# Patient Record
Sex: Female | Born: 1940
Health system: Southern US, Community
[De-identification: ages and names within clinical notes are randomized; demographics above are authoritative.]

## PROBLEM LIST (undated history)

## (undated) DIAGNOSIS — E039 Hypothyroidism, unspecified: Secondary | ICD-10-CM

## (undated) DIAGNOSIS — E785 Hyperlipidemia, unspecified: Secondary | ICD-10-CM

## (undated) DIAGNOSIS — M8580 Other specified disorders of bone density and structure, unspecified site: Secondary | ICD-10-CM

## (undated) DIAGNOSIS — I4891 Unspecified atrial fibrillation: Secondary | ICD-10-CM

## (undated) DIAGNOSIS — I2510 Atherosclerotic heart disease of native coronary artery without angina pectoris: Secondary | ICD-10-CM

## (undated) DIAGNOSIS — D259 Leiomyoma of uterus, unspecified: Secondary | ICD-10-CM

## (undated) DIAGNOSIS — I639 Cerebral infarction, unspecified: Secondary | ICD-10-CM

## (undated) DIAGNOSIS — K219 Gastro-esophageal reflux disease without esophagitis: Secondary | ICD-10-CM

## (undated) HISTORY — DX: Hypothyroidism, unspecified: E03.9

## (undated) HISTORY — PX: ABLATION OF DYSRHYTHMIC FOCUS: SHX254

## (undated) HISTORY — DX: Hyperlipidemia, unspecified: E78.5

## (undated) HISTORY — DX: Unspecified atrial fibrillation: I48.91

## (undated) HISTORY — DX: Cerebral infarction, unspecified: I63.9

## (undated) HISTORY — DX: Gastro-esophageal reflux disease without esophagitis: K21.9

## (undated) HISTORY — DX: Atherosclerotic heart disease of native coronary artery without angina pectoris: I25.10

## (undated) HISTORY — DX: Leiomyoma of uterus, unspecified: D25.9

## (undated) HISTORY — DX: Other specified disorders of bone density and structure, unspecified site: M85.80

---

## 1961-03-14 HISTORY — PX: OTHER SURGICAL HISTORY: SHX169

## 1983-03-15 HISTORY — PX: TUBAL LIGATION: SHX77

## 2009-01-09 DIAGNOSIS — I10 Essential (primary) hypertension: Secondary | ICD-10-CM | POA: Insufficient documentation

## 2009-03-14 HISTORY — PX: BLADDER REPAIR: SHX76

## 2010-12-14 DIAGNOSIS — I480 Paroxysmal atrial fibrillation: Secondary | ICD-10-CM | POA: Insufficient documentation

## 2010-12-31 DIAGNOSIS — M19049 Primary osteoarthritis, unspecified hand: Secondary | ICD-10-CM | POA: Insufficient documentation

## 2010-12-31 DIAGNOSIS — L719 Rosacea, unspecified: Secondary | ICD-10-CM | POA: Insufficient documentation

## 2010-12-31 DIAGNOSIS — E782 Mixed hyperlipidemia: Secondary | ICD-10-CM | POA: Insufficient documentation

## 2011-03-15 HISTORY — PX: ESOPHAGEAL DILATION: SHX303

## 2011-08-12 DIAGNOSIS — K219 Gastro-esophageal reflux disease without esophagitis: Secondary | ICD-10-CM | POA: Insufficient documentation

## 2011-11-29 ENCOUNTER — Other Ambulatory Visit: Payer: Self-pay | Admitting: Family Medicine

## 2011-11-29 DIAGNOSIS — Z780 Asymptomatic menopausal state: Secondary | ICD-10-CM

## 2011-12-04 HISTORY — PX: CAROTID STENT: SHX1301

## 2011-12-05 DIAGNOSIS — I252 Old myocardial infarction: Secondary | ICD-10-CM | POA: Insufficient documentation

## 2011-12-08 ENCOUNTER — Other Ambulatory Visit: Payer: Self-pay

## 2012-01-10 DIAGNOSIS — F411 Generalized anxiety disorder: Secondary | ICD-10-CM | POA: Insufficient documentation

## 2012-02-28 ENCOUNTER — Other Ambulatory Visit: Payer: Self-pay | Admitting: Family Medicine

## 2012-02-28 DIAGNOSIS — Z780 Asymptomatic menopausal state: Secondary | ICD-10-CM

## 2012-03-20 ENCOUNTER — Ambulatory Visit
Admission: RE | Admit: 2012-03-20 | Discharge: 2012-03-20 | Disposition: A | Discharge status: Home / Self Care | Payer: Medicare Other | Source: Ambulatory Visit | Attending: Family Medicine | Admitting: Family Medicine

## 2012-03-20 DIAGNOSIS — Z780 Asymptomatic menopausal state: Secondary | ICD-10-CM

## 2012-04-03 ENCOUNTER — Other Ambulatory Visit: Payer: Self-pay | Admitting: Family Medicine

## 2012-04-03 DIAGNOSIS — Z1231 Encounter for screening mammogram for malignant neoplasm of breast: Secondary | ICD-10-CM

## 2012-04-27 ENCOUNTER — Ambulatory Visit: Payer: Medicare Other

## 2012-05-16 DIAGNOSIS — I2510 Atherosclerotic heart disease of native coronary artery without angina pectoris: Secondary | ICD-10-CM | POA: Insufficient documentation

## 2012-05-25 ENCOUNTER — Ambulatory Visit: Payer: Medicare Other

## 2012-06-08 DIAGNOSIS — M8580 Other specified disorders of bone density and structure, unspecified site: Secondary | ICD-10-CM | POA: Insufficient documentation

## 2012-06-26 ENCOUNTER — Ambulatory Visit: Payer: Medicare Other

## 2013-05-28 ENCOUNTER — Other Ambulatory Visit: Payer: Self-pay | Admitting: Family Medicine

## 2013-05-28 DIAGNOSIS — Z1231 Encounter for screening mammogram for malignant neoplasm of breast: Secondary | ICD-10-CM

## 2013-06-17 ENCOUNTER — Ambulatory Visit: Payer: Medicare Other

## 2013-07-02 ENCOUNTER — Ambulatory Visit: Payer: Medicare Other

## 2013-07-11 ENCOUNTER — Ambulatory Visit: Payer: Medicare Other

## 2013-07-23 ENCOUNTER — Encounter (INDEPENDENT_AMBULATORY_CARE_PROVIDER_SITE_OTHER): Payer: Self-pay

## 2013-07-23 ENCOUNTER — Ambulatory Visit
Admission: RE | Admit: 2013-07-23 | Discharge: 2013-07-23 | Disposition: A | Discharge status: Home / Self Care | Payer: Medicare Other | Source: Ambulatory Visit | Attending: Family Medicine | Admitting: Family Medicine

## 2013-07-23 DIAGNOSIS — Z1231 Encounter for screening mammogram for malignant neoplasm of breast: Secondary | ICD-10-CM

## 2013-07-26 ENCOUNTER — Other Ambulatory Visit: Payer: Self-pay | Admitting: Family Medicine

## 2013-07-26 DIAGNOSIS — R928 Other abnormal and inconclusive findings on diagnostic imaging of breast: Secondary | ICD-10-CM

## 2013-08-07 ENCOUNTER — Ambulatory Visit
Admission: RE | Admit: 2013-08-07 | Discharge: 2013-08-07 | Disposition: A | Discharge status: Home / Self Care | Payer: Medicare Other | Source: Ambulatory Visit | Attending: Family Medicine | Admitting: Family Medicine

## 2013-08-07 ENCOUNTER — Other Ambulatory Visit: Payer: Self-pay | Admitting: Family Medicine

## 2013-08-07 DIAGNOSIS — R928 Other abnormal and inconclusive findings on diagnostic imaging of breast: Secondary | ICD-10-CM

## 2013-08-07 DIAGNOSIS — R921 Mammographic calcification found on diagnostic imaging of breast: Secondary | ICD-10-CM

## 2013-08-19 ENCOUNTER — Ambulatory Visit
Admission: RE | Admit: 2013-08-19 | Discharge: 2013-08-19 | Disposition: A | Discharge status: Home / Self Care | Payer: Medicare Other | Source: Ambulatory Visit | Attending: Family Medicine | Admitting: Family Medicine

## 2013-08-19 DIAGNOSIS — R921 Mammographic calcification found on diagnostic imaging of breast: Secondary | ICD-10-CM

## 2013-08-19 HISTORY — PX: BREAST BIOPSY: SHX20

## 2014-05-08 ENCOUNTER — Other Ambulatory Visit: Payer: Self-pay | Admitting: Family Medicine

## 2014-05-08 DIAGNOSIS — K225 Diverticulum of esophagus, acquired: Secondary | ICD-10-CM | POA: Insufficient documentation

## 2014-05-08 DIAGNOSIS — M8580 Other specified disorders of bone density and structure, unspecified site: Secondary | ICD-10-CM

## 2014-05-27 ENCOUNTER — Inpatient Hospital Stay: Admission: RE | Admit: 2014-05-27 | Payer: Self-pay | Source: Ambulatory Visit

## 2014-06-18 ENCOUNTER — Other Ambulatory Visit: Payer: Self-pay

## 2014-08-19 ENCOUNTER — Other Ambulatory Visit: Payer: Self-pay

## 2014-08-19 DIAGNOSIS — Z1231 Encounter for screening mammogram for malignant neoplasm of breast: Secondary | ICD-10-CM

## 2014-10-02 ENCOUNTER — Ambulatory Visit: Payer: Self-pay

## 2014-10-02 ENCOUNTER — Other Ambulatory Visit: Payer: Self-pay

## 2014-10-13 ENCOUNTER — Other Ambulatory Visit: Payer: Self-pay

## 2014-10-13 ENCOUNTER — Ambulatory Visit
Admission: RE | Admit: 2014-10-13 | Discharge: 2014-10-13 | Disposition: A | Discharge status: Home / Self Care | Payer: Medicare Other | Source: Ambulatory Visit | Attending: Family Medicine | Admitting: Family Medicine

## 2014-10-13 ENCOUNTER — Ambulatory Visit: Payer: Self-pay

## 2014-10-13 ENCOUNTER — Ambulatory Visit
Admission: RE | Admit: 2014-10-13 | Discharge: 2014-10-13 | Disposition: A | Discharge status: Home / Self Care | Payer: Medicare Other | Source: Ambulatory Visit

## 2014-10-13 DIAGNOSIS — M8580 Other specified disorders of bone density and structure, unspecified site: Secondary | ICD-10-CM

## 2014-10-13 DIAGNOSIS — Z1231 Encounter for screening mammogram for malignant neoplasm of breast: Secondary | ICD-10-CM

## 2015-02-12 DIAGNOSIS — G8929 Other chronic pain: Secondary | ICD-10-CM | POA: Insufficient documentation

## 2015-02-12 DIAGNOSIS — M25561 Pain in right knee: Secondary | ICD-10-CM

## 2015-02-12 DIAGNOSIS — M25562 Pain in left knee: Secondary | ICD-10-CM

## 2015-02-12 DIAGNOSIS — F339 Major depressive disorder, recurrent, unspecified: Secondary | ICD-10-CM | POA: Insufficient documentation

## 2015-06-18 DIAGNOSIS — I252 Old myocardial infarction: Secondary | ICD-10-CM | POA: Insufficient documentation

## 2015-07-07 DIAGNOSIS — Z7901 Long term (current) use of anticoagulants: Secondary | ICD-10-CM

## 2015-07-07 HISTORY — DX: Long term (current) use of anticoagulants: Z79.01

## 2015-08-18 DIAGNOSIS — M4802 Spinal stenosis, cervical region: Secondary | ICD-10-CM | POA: Insufficient documentation

## 2016-03-16 ENCOUNTER — Telehealth: Payer: Self-pay | Admitting: Family Medicine

## 2016-03-16 NOTE — Telephone Encounter (Signed)
Encounter opened in error

## 2016-05-02 ENCOUNTER — Other Ambulatory Visit: Payer: Self-pay | Admitting: Family Medicine

## 2016-05-02 DIAGNOSIS — Z1231 Encounter for screening mammogram for malignant neoplasm of breast: Secondary | ICD-10-CM

## 2016-05-12 DIAGNOSIS — M47812 Spondylosis without myelopathy or radiculopathy, cervical region: Secondary | ICD-10-CM | POA: Insufficient documentation

## 2016-05-16 ENCOUNTER — Ambulatory Visit
Admission: RE | Admit: 2016-05-16 | Discharge: 2016-05-16 | Disposition: A | Discharge status: Home / Self Care | Payer: Medicare Other | Source: Ambulatory Visit | Attending: Family Medicine | Admitting: Family Medicine

## 2016-05-16 DIAGNOSIS — Z1231 Encounter for screening mammogram for malignant neoplasm of breast: Secondary | ICD-10-CM

## 2017-01-11 ENCOUNTER — Encounter: Payer: Self-pay | Admitting: Pharmacist

## 2017-01-11 NOTE — Progress Notes (Unsigned)
Dr. Marin Olp gave pt Kisqali sample Lot [id number]  Exp: 6/19 UAD on sample box (600mg  daily for 3 weeks on and one week off and Femara QD)

## 2017-02-14 ENCOUNTER — Telehealth: Payer: Self-pay | Admitting: General Practice

## 2017-02-14 ENCOUNTER — Ambulatory Visit: Payer: Medicare Other | Admitting: Family Medicine

## 2017-02-14 NOTE — Telephone Encounter (Signed)
Copied from [place]. Topic: Quick Communication - Appointment Cancellation >> Feb 14, 2017  8:53 AM Oneta Rack wrote: Relation to pt: self  Call back number: [phone]   Reason for call:  Patient cancelled her 2pm with Billey Chang for today, patient son had a massive stroke and she would like to be there for him while he recovers. Patient would like to [place] sooner then later, PCP has no availability., please advise

## 2017-02-14 NOTE — Telephone Encounter (Signed)
PCP has been made aware.  

## 2017-03-15 ENCOUNTER — Ambulatory Visit: Payer: Medicare Other | Admitting: Family Medicine

## 2017-03-20 ENCOUNTER — Other Ambulatory Visit: Payer: Self-pay | Admitting: *Deleted

## 2017-03-22 ENCOUNTER — Encounter: Payer: Self-pay | Admitting: Family Medicine

## 2017-03-22 ENCOUNTER — Ambulatory Visit: Payer: Medicare Other | Admitting: Family Medicine

## 2017-03-22 VITALS — BP 110/74 | HR 71 | Temp 97.6°F | Ht 63.0 in | Wt 156.2 lb

## 2017-03-22 DIAGNOSIS — R7989 Other specified abnormal findings of blood chemistry: Secondary | ICD-10-CM | POA: Diagnosis not present

## 2017-03-22 DIAGNOSIS — R5383 Other fatigue: Secondary | ICD-10-CM

## 2017-03-22 DIAGNOSIS — F411 Generalized anxiety disorder: Secondary | ICD-10-CM | POA: Diagnosis not present

## 2017-03-22 LAB — CBC WITH DIFFERENTIAL/PLATELET
Basophils Absolute: 0 10*3/uL (ref 0.0–0.1)
Basophils Relative: 0.3 % (ref 0.0–3.0)
Eosinophils Absolute: 0.1 10*3/uL (ref 0.0–0.7)
Eosinophils Relative: 2.2 % (ref 0.0–5.0)
HCT: 40.2 % (ref 36.0–46.0)
Hemoglobin: 13 g/dL (ref 12.0–15.0)
Lymphocytes Relative: 19.2 % (ref 12.0–46.0)
Lymphs Abs: 1.2 10*3/uL (ref 0.7–4.0)
MCHC: 32.4 g/dL (ref 30.0–36.0)
MCV: 97.4 fl (ref 78.0–100.0)
Monocytes Absolute: 0.9 10*3/uL (ref 0.1–1.0)
Monocytes Relative: 13.8 % — ABNORMAL HIGH (ref 3.0–12.0)
Neutro Abs: 4.1 10*3/uL (ref 1.4–7.7)
Neutrophils Relative %: 64.5 % (ref 43.0–77.0)
Platelets: 207 10*3/uL (ref 150.0–400.0)
RBC: 4.13 Mil/uL (ref 3.87–5.11)
RDW: 13.1 % (ref 11.5–15.5)
WBC: 6.4 10*3/uL (ref 4.0–10.5)

## 2017-03-22 LAB — TSH: TSH: 6.1 u[IU]/mL — ABNORMAL HIGH (ref 0.35–4.50)

## 2017-03-22 LAB — T4, FREE: Free T4: 0.71 ng/dL (ref 0.60–1.60)

## 2017-03-22 MED ORDER — ALPRAZOLAM 0.5 MG PO TABS: 0.5000 mg | ORAL_TABLET | Freq: Every day | ORAL | 0 refills | Status: DC | PRN

## 2017-03-22 MED ORDER — SERTRALINE HCL 100 MG PO TABS: 150.0000 mg | ORAL_TABLET | Freq: Every day | ORAL | 3 refills | Status: DC

## 2017-03-22 NOTE — Progress Notes (Signed)
Subjective  CC:  Chief Complaint  Patient presents with  . Establish Care    Transfer from [place]  . Fatigue  . discuss Thyroid    HPI: Brittney Cochran is a 77 y.o. female who presents to [place] at [facility] today to establish care with me as a new patient. She is a former [place] patient and is here to reestablish care with me today.   She has the following concerns or needs:   Had mildly elevated TSH in march of 2018; now would like rechecked. C/o fatigue but really more low motivation. Not depressed; dealing with high stress for last 15 months due to son with CVA - she has been basically living in [facility] parttime to care for him. No edema, hair or skin changes. Increased zoloft ot 150 daily in October to handle increased anxiety sxs and that helped. Uses xanax rarely; last refilled in July; has few pills left. Requests refills.   S/p cardioablation for afib. Remains on coumdin.  We updated and reviewed the patient's past history in detail and it is documented below.  Problem  Djd (Degenerative Joint Disease), Cervical  Foraminal Stenosis of Cervical Region   Overview:  xrays 2017; multilevel DJD   Long Term Current Use of Anticoagulant Therapy  Old Myocardial Infarction  Chronic Pain of Both Knees  Major Depression, Recurrent, Chronic (Hcc)  Zenker Diverticulum   Overview:  By upper GI study.  GI is following.  Patient hoping to avoid surgical repair.  2016   Osteopenia   Overview:  T=-0.2 lumbar spine, -1.6 at left femur. Stable 2016, mild worsening Overview:  Overview:  T=-0.2 lumbar spine, -1.6 at left femur. Recheck 2-3 years. 05/2012.   Atherosclerotic Heart Disease of Native Coronary Artery Without Angina Pectoris   Overview:  Cardiac MRI - EF normalized to 55% from 40%, no wall motion abnormalities nor scarring For lexiscan stress test 07/2013 - unremarkable/cla Overview:  Overview:  Cardiac MRI - EF normalized to 55% from 40%, no wall motion  abnormalities nor scarring For lexiscan stress test 07/2013 - unremarkable/cla   Generalized Anxiety Disorder   Overview:  Triggers are winter months, snow, darkness, family problems: has panic sxs. Controlled on prn xanax and zolft. Overview:  Overview:  Triggers are winter months, snow, darkness, family problems: has panic sxs. Controlled on prn xanax and zolft.   History of Non-St Elevation Myocardial Infarction (Nstemi)   Overview:  12/02/11 - [facility], s/p PTCA RCA, nonSTEMI EF 60%   Gastro-Esophageal Reflux Disease Without Esophagitis  Mixed Hyperlipidemia   Overview:  Goal LDL < 80; cards managing   Osteoarthritis, Hand  Rosacea  Paroxysmal Atrial Fibrillation (Hcc)   Overview:  [place] [facility] follows coumadin levels Heart doc said ok to use celebrex Overview:  Overview:  [place] [facility] follows coumadin levels Heart doc said ok to use celebrex Overview:  IMPRESSION: Stable.  She sees Dr.  Elonda Husky.  dz   Benign Essential Hypertension   Health Maintenance  Topic Date Due  . TETANUS/TDAP  03/14/2016  . MAMMOGRAM  05/16/2017  . INFLUENZA VACCINE  Completed  . DEXA SCAN  Completed  . PNA vac Low Risk Adult  Completed   Immunization History  Administered Date(s) Administered  . Influenza Split 01/11/2007, 02/04/2008, 01/01/2009, 04/19/2010, 12/14/2010, 01/12/2011  . Influenza, High Dose Seasonal PF 11/29/2011, 12/18/2012, 12/10/2013, 02/02/2015, 12/16/2016  . Influenza, Seasonal, Injecte, Preservative Fre 03/14/2009, 12/08/2015  . Influenza-Unspecified 03/14/2009  . Pneumococcal Conjugate-13 04/08/2014  . Pneumococcal  Polysaccharide-23 01/01/2009, 03/14/2009  . Pneumococcal-Unspecified 03/14/2009  . Tdap 03/14/2004, 03/14/2006   Current Meds  Medication Sig  . ALPRAZolam (XANAX) 0.5 MG tablet Take 1 tablet (0.5 mg total) by mouth daily as needed.  Marland Kitchen aspirin EC 81 MG tablet Take 81 mg by mouth.  . carvedilol (COREG) 12.5 MG tablet TK 1 T PO  BID WITH MEALS  . Co-Enzyme Q-10 30 MG CAPS Take by mouth.  . fluticasone (FLONASE) 50 MCG/ACT nasal spray USE 1 SPRAY NASALLY DAILY  . lisinopril (PRINIVIL,ZESTRIL) 2.5 MG tablet Take 2.5 mg by mouth daily.   . nitroGLYCERIN (NITROSTAT) 0.4 MG SL tablet Place 0.4 mg under the tongue.  Marland Kitchen omeprazole (PRILOSEC) 40 MG capsule TAKE 1 CAPSULE BY MOUTH TWICE DAILY  . ranolazine (RANEXA) 500 MG 12 hr tablet Take 500 mg by mouth 2 (two) times daily.   . rosuvastatin (CRESTOR) 40 MG tablet Take 40 mg by mouth daily.   . sertraline (ZOLOFT) 100 MG tablet Take 1.5 tablets (150 mg total) by mouth daily.  Marland Kitchen VITAMIN D, CHOLECALCIFEROL, PO Take by mouth.  . warfarin (COUMADIN) 2 MG tablet TAKE 1 TABLET BY MOUTH DAILY, EXCEPT TAKE 2 TABLETS ON WEDNESDAY, SATURDAY, AND SUNDAY OR AS DIRECTED  . [DISCONTINUED] ALPRAZolam (XANAX) 0.5 MG tablet Take 0.5 mg by mouth 2 (two) times daily as needed.   . [DISCONTINUED] sertraline (ZOLOFT) 50 MG tablet Take 150 mg by mouth daily.    Allergies: Patient has No Known Allergies.  Past Medical History Patient  has a past medical history of Atrial fibrillation ([place]), Coronary artery disease, GERD (gastroesophageal reflux disease), Hyperlipemia, Leiomyoma of body of uterus, and Osteopenia. Past Surgical History Patient  has a past surgical history that includes Breast biopsy (Left, 08/19/2013); Ablation of dysrhythmic focus; Bladder repair (2011); Tubal ligation (1985); Floating Kidney [phone]); Carotid stent (12/04/2011); and Esophageal dilation (2013). Family History: Patient family history includes Alcohol abuse in her father; Atrial fibrillation in her mother; Diabetes in her brother, father, and paternal grandmother; Early death in her father and maternal grandfather; Heart attack in her father, maternal uncle, and paternal grandmother; Kidney disease in her maternal grandmother; Stroke in her maternal aunt, maternal grandfather, paternal aunt, and son; Transient ischemic  attack in her mother. Social History:  Patient  reports that  has never smoked. she has never used smokeless tobacco. She reports that she drinks about 1.2 - 1.8 oz of alcohol per week. She reports that she does not use drugs.  Review of Systems: Constitutional: negative for fever or malaise Cardiovascular: negative for chest pain Respiratory: negative for SOB or persistent cough Gastrointestinal: negative for abdominal pain Genitourinary: negative for dysuria or gross hematuria Musculoskeletal: negative for new gait disturbance or muscular weakness Integumentary: negative for new or persistent rashes  Patient Care Team    Relationship Specialty Notifications Start End  Leamon Arnt, MD PCP - General Family Medicine  03/21/17     Objective  Vitals: BP 110/74 (BP Location: Left Arm, Patient Position: Sitting, Cuff Size: Normal)   Pulse 71   Temp 97.6 F (36.4 C) (Oral)   Ht 5\' 3"  (1.6 m)   Wt 156 lb 4 oz (70.9 kg)   SpO2 95%   BMI 27.68 kg/m  General:  Well developed, well nourished, no acute distress  Psych:  Alert and oriented,normal mood and affect HEENT:  Normocephalic, atraumatic, supple neck  Cardiovascular:  RRR without murmur, no edema Respiratory:  Good breath sounds bilaterally, CTAB with normal respiratory effort  Gastrointestinal: normal bowel sounds, soft, non-tender, no noted masses. No HSM Skin:  Warm, no rashes Neurologic:   Mental status is normal. Gross motor and sensory exams are normal. Normal gait  Assessment  1. Fatigue, unspecified type   2. Abnormal TSH   3. Generalized anxiety disorder      Plan   Today's visit was 30 minutes long. Greater than 50% of this time was devoted to face to face counseling with the patient and coordination of care. We discussed her diagnosis, prognosis, treatment options and will check TSH and t4 today. Discussed need to start planning for care of her son. Continue zoloft 150 daily and refilled xanax. Will reassess  anxiety and mood in 3 months at cpe.   Follow up: 3 months for cpe   Commons side effects, risks, benefits, and alternatives for medications and treatment plan prescribed today were discussed, and the patient expressed understanding of the given instructions. Patient is instructed to call or message via MyChart if he/she has any questions or concerns regarding our treatment plan. No barriers to understanding were identified. We discussed Red Flag symptoms and signs in detail. Patient expressed understanding regarding what to do in case of urgent or emergency type symptoms.   Medication list was reconciled, printed and provided to the patient in AVS. Patient instructions and summary information was reviewed with the patient as documented in the AVS. This note was prepared with assistance of Dragon voice recognition software. Occasional wrong-word or sound-a-like substitutions may have occurred due to the inherent limitations of voice recognition software  Orders Placed This Encounter  Procedures  . CBC with Differential/Platelet  . TSH  . T4, free   Meds ordered this encounter  Medications  . sertraline (ZOLOFT) 100 MG tablet    Sig: Take 1.5 tablets (150 mg total) by mouth daily.    Dispense:  90 tablet    Refill:  3  . ALPRAZolam (XANAX) 0.5 MG tablet    Sig: Take 1 tablet (0.5 mg total) by mouth daily as needed.    Dispense:  30 tablet    Refill:  0

## 2017-03-22 NOTE — Patient Instructions (Signed)
It was so good seeing you again! Thank you for establishing with my new practice and allowing me to continue caring for you. It means a lot to me.   Please schedule a follow up appointment with me in 2-3 months for your complete physical.  Please start planning for your son, in a more realistic way.

## 2017-03-24 ENCOUNTER — Other Ambulatory Visit: Payer: Self-pay | Admitting: Family Medicine

## 2017-03-24 ENCOUNTER — Encounter: Payer: Self-pay | Admitting: Family Medicine

## 2017-03-24 DIAGNOSIS — E039 Hypothyroidism, unspecified: Secondary | ICD-10-CM

## 2017-03-24 DIAGNOSIS — E038 Other specified hypothyroidism: Secondary | ICD-10-CM | POA: Insufficient documentation

## 2017-03-24 HISTORY — DX: Other specified hypothyroidism: E03.8

## 2017-03-24 MED ORDER — SYNTHROID 25 MCG PO TABS: 25.0000 ug | ORAL_TABLET | Freq: Every day | ORAL | 1 refills | Status: DC

## 2017-03-24 NOTE — Progress Notes (Signed)
Starting synthroid for subclin hypothyroidism.

## 2017-03-24 NOTE — Progress Notes (Signed)
Please call patient: I have reviewed his/her lab results. Her thyroid function still remains in the low normal range, and is not likely causing her fatigue: however, I recommend starting a low dose thyroid supplement to ensure it stays in the normal range. I've ordered Synthroid 11mcg to take daily. Do not take with iron or vitamin D so it will be best absorbed. We will recheck these levels at her next appointment.  And please send her a copy of the results.

## 2017-04-17 DIAGNOSIS — R791 Abnormal coagulation profile: Secondary | ICD-10-CM | POA: Diagnosis not present

## 2017-04-17 DIAGNOSIS — Z5181 Encounter for therapeutic drug level monitoring: Secondary | ICD-10-CM | POA: Diagnosis not present

## 2017-04-17 DIAGNOSIS — Z7901 Long term (current) use of anticoagulants: Secondary | ICD-10-CM | POA: Diagnosis not present

## 2017-04-17 DIAGNOSIS — I4891 Unspecified atrial fibrillation: Secondary | ICD-10-CM | POA: Diagnosis not present

## 2017-04-17 DIAGNOSIS — Z79899 Other long term (current) drug therapy: Secondary | ICD-10-CM | POA: Diagnosis not present

## 2017-05-02 DIAGNOSIS — R0989 Other specified symptoms and signs involving the circulatory and respiratory systems: Secondary | ICD-10-CM | POA: Diagnosis not present

## 2017-05-02 DIAGNOSIS — L600 Ingrowing nail: Secondary | ICD-10-CM | POA: Diagnosis not present

## 2017-05-04 DIAGNOSIS — R0989 Other specified symptoms and signs involving the circulatory and respiratory systems: Secondary | ICD-10-CM | POA: Diagnosis not present

## 2017-05-18 ENCOUNTER — Encounter: Payer: Self-pay | Admitting: Family Medicine

## 2017-05-18 ENCOUNTER — Ambulatory Visit (INDEPENDENT_AMBULATORY_CARE_PROVIDER_SITE_OTHER): Payer: Medicare Other | Admitting: Family Medicine

## 2017-05-18 ENCOUNTER — Other Ambulatory Visit: Payer: Self-pay

## 2017-05-18 VITALS — BP 102/64 | HR 62 | Temp 98.0°F | Resp 16 | Ht 62.0 in | Wt 158.0 lb

## 2017-05-18 DIAGNOSIS — E039 Hypothyroidism, unspecified: Secondary | ICD-10-CM | POA: Diagnosis not present

## 2017-05-18 DIAGNOSIS — K219 Gastro-esophageal reflux disease without esophagitis: Secondary | ICD-10-CM

## 2017-05-18 DIAGNOSIS — E782 Mixed hyperlipidemia: Secondary | ICD-10-CM

## 2017-05-18 DIAGNOSIS — I4891 Unspecified atrial fibrillation: Secondary | ICD-10-CM | POA: Diagnosis not present

## 2017-05-18 DIAGNOSIS — I252 Old myocardial infarction: Secondary | ICD-10-CM

## 2017-05-18 DIAGNOSIS — G8929 Other chronic pain: Secondary | ICD-10-CM

## 2017-05-18 DIAGNOSIS — Z0000 Encounter for general adult medical examination without abnormal findings: Secondary | ICD-10-CM

## 2017-05-18 DIAGNOSIS — F339 Major depressive disorder, recurrent, unspecified: Secondary | ICD-10-CM | POA: Diagnosis not present

## 2017-05-18 DIAGNOSIS — Z7901 Long term (current) use of anticoagulants: Secondary | ICD-10-CM | POA: Diagnosis not present

## 2017-05-18 DIAGNOSIS — R791 Abnormal coagulation profile: Secondary | ICD-10-CM | POA: Diagnosis not present

## 2017-05-18 DIAGNOSIS — Z0001 Encounter for general adult medical examination with abnormal findings: Secondary | ICD-10-CM | POA: Diagnosis not present

## 2017-05-18 DIAGNOSIS — F411 Generalized anxiety disorder: Secondary | ICD-10-CM

## 2017-05-18 DIAGNOSIS — E038 Other specified hypothyroidism: Secondary | ICD-10-CM

## 2017-05-18 DIAGNOSIS — M8580 Other specified disorders of bone density and structure, unspecified site: Secondary | ICD-10-CM | POA: Diagnosis not present

## 2017-05-18 DIAGNOSIS — M25561 Pain in right knee: Secondary | ICD-10-CM | POA: Diagnosis not present

## 2017-05-18 DIAGNOSIS — Z5181 Encounter for therapeutic drug level monitoring: Secondary | ICD-10-CM | POA: Diagnosis not present

## 2017-05-18 DIAGNOSIS — I480 Paroxysmal atrial fibrillation: Secondary | ICD-10-CM

## 2017-05-18 DIAGNOSIS — Z79899 Other long term (current) drug therapy: Secondary | ICD-10-CM | POA: Diagnosis not present

## 2017-05-18 LAB — LIPID PANEL
Cholesterol: 140 mg/dL (ref 0–200)
HDL: 80 mg/dL (ref >39.00)
LDL Cholesterol: 39 mg/dL (ref 0–99)
NonHDL: 59.5
Total CHOL/HDL Ratio: 2
Triglycerides: 104 mg/dL (ref 0.0–149.0)
VLDL: 20.8 mg/dL (ref 0.0–40.0)

## 2017-05-18 LAB — CBC WITH DIFFERENTIAL/PLATELET
Basophils Absolute: 0 10*3/uL (ref 0.0–0.1)
Basophils Relative: 0.6 % (ref 0.0–3.0)
Eosinophils Absolute: 0.1 10*3/uL (ref 0.0–0.7)
Eosinophils Relative: 2.2 % (ref 0.0–5.0)
HCT: 39.5 % (ref 36.0–46.0)
Hemoglobin: 13.1 g/dL (ref 12.0–15.0)
Lymphocytes Relative: 21.4 % (ref 12.0–46.0)
Lymphs Abs: 1.2 10*3/uL (ref 0.7–4.0)
MCHC: 33.1 g/dL (ref 30.0–36.0)
MCV: 95.3 fl (ref 78.0–100.0)
Monocytes Absolute: 0.8 10*3/uL (ref 0.1–1.0)
Monocytes Relative: 13.3 % — ABNORMAL HIGH (ref 3.0–12.0)
Neutro Abs: 3.6 10*3/uL (ref 1.4–7.7)
Neutrophils Relative %: 62.5 % (ref 43.0–77.0)
Platelets: 199 10*3/uL (ref 150.0–400.0)
RBC: 4.14 Mil/uL (ref 3.87–5.11)
RDW: 13.7 % (ref 11.5–15.5)
WBC: 5.7 10*3/uL (ref 4.0–10.5)

## 2017-05-18 LAB — COMPREHENSIVE METABOLIC PANEL
ALT: 20 U/L (ref 0–35)
AST: 23 U/L (ref 0–37)
Albumin: 4.2 g/dL (ref 3.5–5.2)
Alkaline Phosphatase: 76 U/L (ref 39–117)
BUN: 17 mg/dL (ref 6–23)
CO2: 29 mEq/L (ref 19–32)
Calcium: 9.7 mg/dL (ref 8.4–10.5)
Chloride: 102 mEq/L (ref 96–112)
Creatinine, Ser: 0.85 mg/dL (ref 0.40–1.20)
GFR: 68.75 mL/min (ref >60.00)
Glucose, Bld: 92 mg/dL (ref 70–99)
Potassium: 4.3 mEq/L (ref 3.5–5.1)
Sodium: 138 mEq/L (ref 135–145)
Total Bilirubin: 0.5 mg/dL (ref 0.2–1.2)
Total Protein: 6.5 g/dL (ref 6.0–8.3)

## 2017-05-18 LAB — TSH: TSH: 3.73 u[IU]/mL (ref 0.35–4.50)

## 2017-05-18 NOTE — Patient Instructions (Addendum)
Please return in 6 months for recheck blood pressure and mood. Return for a steroid injection in your right knee. tomorrow Ice your knee twice a day.   Medicare recommends an Annual Wellness Visit for all patients. Please schedule this to be done with our Nurse Educator, Maudie Mercury. This is an informative "talk" visit; it's goals are to ensure that your health care needs are being met and to give you education regarding avoiding falls, ensuring you are not suffering from depression or problems with memory or thinking, and to educate you on Advance Care Planning. It helps me take good care of you!  Please schedule your mammogram and bone density in April at the breast center.   If you have any questions or concerns, please don't hesitate to send me a message via MyChart or call the office at [phone]. Thank you for visiting with [place] today! It's our pleasure caring for you.  Please do these things to maintain good health!   Exercise at least 30-45 minutes a day,  4-5 days a week.   Eat a low-fat diet with lots of fruits and vegetables, up to 7-9 servings per day.  Drink plenty of water daily. Try to drink 8 8oz glasses per day.  Seatbelts can save your life. Always wear your seatbelt.  Place Smoke Detectors on every level of your home and check batteries every year.  Schedule an appointment with an eye doctor for an eye exam every 1-2 years  Safe sex - use condoms to protect yourself from STDs if you could be exposed to these types of infections. Use birth control if you do not want to become pregnant and are sexually active.  Avoid heavy alcohol use. If you drink, keep it to less than 2 drinks/day and not every day.  [place].  Choose someone you trust that could speak for you if you became unable to speak for yourself.  Depression is common in our stressful world.If you're feeling down or losing interest in things you normally enjoy, please come in for a visit.  If  anyone is threatening or hurting you, please get help. Physical or Emotional Violence is never OK.

## 2017-05-18 NOTE — Progress Notes (Signed)
Subjective  Chief Complaint  Patient presents with  . Annual Exam    Wants a copy of her last labs, zoloft, thyroid meds, right knee popping in and out of socket, patient is feeling better    HPI: Brittney Cochran is a 77 y.o. female who presents to [place] at [facility] today for a Female Wellness Visit. She also has the concerns and/or needs as listed above in the chief complaint. These will be addressed in addition to the Health Maintenance Visit.   Wellness Visit: annual visit with health maintenance review and exam without Pap   Doing much better than last visit. HM: due for labs, mammo, dexa. Lifestyle: Body mass index is 28.58 kg/m. Wt Readings from Last 3 Encounters:  03/22/17 156 lb 4 oz (70.9 kg)   Diet: general Exercise: rarely, walking  Chronic disease management visit and/or acute problem visit:  Depression/anxiety; remains on zoloft 150 and mood is much better now. Has been home for 2 months and able to get herself more organized. [place] next [place] and feels good about it. No AEs. Sleep is good.   CAD/afib: stable.   Hyperlipdemia: due for labs. Non fasting. On statin. No myalgias.   Subclinical hypothyroidism: on 25 mcg synthroid; feels much better but relates less fatigue to social circumstances rather than meds. No tremor. No weight changes. Physically feeling well.   Right knee pain x 1 year: worsening; popping. No locking or giveway. No injury. + swelling. Intermittent pain. Had h/o meniscus injury in past.   GERD - stable on omeprazole  Patient Active Problem List   Diagnosis Date Noted  . Long term current use of anticoagulant therapy 07/07/2015    Priority: High  . Major depression, recurrent, chronic ([place]) 02/12/2015    Priority: High  . Atherosclerotic heart disease of native coronary artery without angina pectoris 05/16/2012    Priority: High  . Generalized anxiety disorder 01/10/2012      Priority: High  . History of non-ST elevation myocardial infarction (NSTEMI) 12/05/2011    Priority: High  . Mixed hyperlipidemia 12/31/2010    Priority: High  . Paroxysmal atrial fibrillation ([place]) 12/14/2010    Priority: High  . Benign essential hypertension 01/09/2009    Priority: High  . Subclinical hypothyroidism 03/24/2017    Priority: Medium  . DJD (degenerative joint disease), cervical 05/12/2016    Priority: Medium  . Foraminal stenosis of cervical region 08/18/2015    Priority: Medium  . Chronic pain of both knees 02/12/2015    Priority: Medium  . Zenker diverticulum 05/08/2014    Priority: Medium  . Osteopenia 06/08/2012    Priority: Medium  . Gastro-esophageal reflux disease without esophagitis 08/12/2011    Priority: Medium  . Osteoarthritis, hand 12/31/2010    Priority: Low  . Rosacea 12/31/2010    Priority: Low   Health Maintenance  Topic Date Due  . DEXA SCAN  10/12/2016  . MAMMOGRAM  05/16/2017  . INFLUENZA VACCINE  Completed  . PNA vac Low Risk Adult  Completed   Immunization History  Administered Date(s) Administered  . Influenza Split 01/11/2007, 02/04/2008, 01/01/2009, 04/19/2010, 12/14/2010, 01/12/2011  . Influenza, High Dose Seasonal PF 11/29/2011, 12/18/2012, 12/10/2013, 02/02/2015, 12/16/2016  . Influenza, Seasonal, Injecte, Preservative Fre 03/14/2009, 12/08/2015  . Influenza-Unspecified 03/14/2009  . Pneumococcal Conjugate-13 04/08/2014  . Pneumococcal Polysaccharide-23 01/01/2009, 03/14/2009  . Pneumococcal-Unspecified 03/14/2009  . Tdap 03/14/2004, 03/14/2006   We updated and reviewed  the patient's past history in detail and it is documented below. Allergies: Patient has No Known Allergies. Past Medical History Patient  has a past medical history of Atrial fibrillation ([place]), Coronary artery disease, GERD (gastroesophageal reflux disease), Hyperlipemia, Leiomyoma of body of uterus, Osteopenia, and Subclinical hypothyroidism  (03/24/2017). Past Surgical History Patient  has a past surgical history that includes Breast biopsy (Left, 08/19/2013); Ablation of dysrhythmic focus; Bladder repair (2011); Tubal ligation (1985); Floating Kidney [phone]); Carotid stent (12/04/2011); and Esophageal dilation (2013). Family History: Patient family history includes Alcohol abuse in her father; Atrial fibrillation in her mother; Diabetes in her brother, father, and paternal grandmother; Early death in her father and maternal grandfather; Heart attack in her father, maternal uncle, and paternal grandmother; Kidney disease in her maternal grandmother; Stroke in her maternal aunt, maternal grandfather, paternal aunt, and son; Transient ischemic attack in her mother. Social History:  Patient  reports that  has never smoked. she has never used smokeless tobacco. She reports that she drinks about 1.2 - 1.8 oz of alcohol per week. She reports that she does not use drugs.  Review of Systems: Constitutional: negative for fever or malaise Ophthalmic: negative for photophobia, double vision or loss of vision Cardiovascular: negative for chest pain, dyspnea on exertion, or new LE swelling Respiratory: negative for SOB or persistent cough Gastrointestinal: negative for abdominal pain, change in bowel habits or melena Genitourinary: negative for dysuria or gross hematuria, no abnormal uterine bleeding or disharge Musculoskeletal: negative for new gait disturbance or muscular weakness + left knee pain Integumentary: negative for new or persistent rashes, no breast lumps Neurological: negative for TIA or stroke symptoms Psychiatric: negative for SI or delusions Allergic/Immunologic: negative for hives  Patient Care Team    Relationship Specialty Notifications Start End  Leamon Arnt, MD PCP - General Family Medicine  03/21/17   has GYN: Benjamine Mola stanbaugh  Objective  Vitals: Ht 5\' 2"  (1.575 m)   BMI 28.58 kg/m  General:  Well developed,  well nourished, no acute distress  Psych:  Alert and orientedx3,normal mood and affect, much brighter today HEENT:  Normocephalic, atraumatic, non-icteric sclera, PERRL, oropharynx is clear without mass or exudate, supple neck without adenopathy, mass or thyromegaly Cardiovascular:  Normal S1, S2, RRR without gallop, rub or murmur, nondisplaced PMI Respiratory:  Good breath sounds bilaterally, CTAB with normal respiratory effort Gastrointestinal: normal bowel sounds, soft, non-tender, no noted masses. No HSM MSK: no deformities, contusions. Right knee with swelling, lat jt line ttp, crepitus, neg lachmans, neg mcmurrays. Spine and CVA region are nontender Skin:  Warm, no rashes or suspicious lesions noted Neurologic:    Mental status is normal. CN 2-11 are normal. Gross motor and sensory exams are normal. Normal gait. No tremor Breast Exam: No mass, skin retraction or nipple discharge is appreciated in either breast. No axillary adenopathy. Fibrocystic changes are not noted   Assessment  1. Annual physical exam   2. Subclinical hypothyroidism   3. Major depression, recurrent, chronic ([place])   4. Osteopenia, unspecified location   5. Gastro-esophageal reflux disease without esophagitis   6. Generalized anxiety disorder   7. History of non-ST elevation myocardial infarction (NSTEMI)   8. Mixed hyperlipidemia   9. Paroxysmal atrial fibrillation [facility]) Chronic     Plan  Female Wellness Visit:  Age appropriate Health Maintenance and Prevention measures were discussed with patient. Included topics are cancer screening recommendations, ways to keep healthy (see AVS) including dietary and exercise recommendations, regular eye and dental care,  use of seat belts, and avoidance of moderate alcohol use and tobacco use. Set up mammogram and dexa for April   BMI: discussed patient's BMI and encouraged positive lifestyle modifications to help get to or maintain a target BMI.  HM needs and immunizations  were addressed and ordered. See below for orders. See HM and immunization section for updates. utd  Routine labs and screening tests ordered including cmp, cbc and lipids where appropriate.  Discussed recommendations regarding Vit D and calcium supplementation (see AVS)  Chronic disease f/u and/or acute problem visit: (deemed necessary to be done in addition to the wellness visit):  Subclinical hypothyroidism: recheck levels on low dose supplementation.   Depression is much improved. Continue zoloft 150 daily for next 6-12 months, then consider weaning back to 100.   CAD/afib: stable.   Lipids: recheck on statin. Check lft  Knee pain: OA vs meniscus: return for steroid injection. Ice.   Follow up: Return in about 6 months (around 11/18/2017) for follow up Hypertension, mood follow up.   Commons side effects, risks, benefits, and alternatives for medications and treatment plan prescribed today were discussed, and the patient expressed understanding of the given instructions. Patient is instructed to call or message via MyChart if he/she has any questions or concerns regarding our treatment plan. No barriers to understanding were identified. We discussed Red Flag symptoms and signs in detail. Patient expressed understanding regarding what to do in case of urgent or emergency type symptoms.   Medication list was reconciled, printed and provided to the patient in AVS. Patient instructions and summary information was reviewed with the patient as documented in the AVS. This note was prepared with assistance of Dragon voice recognition software. Occasional wrong-word or sound-a-like substitutions may have occurred due to the inherent limitations of voice recognition software  Orders Placed This Encounter  Procedures  . DG Bone Density  . TSH  . Comprehensive metabolic panel  . CBC with Differential/Platelet  . Lipid panel   No orders of the defined types were placed in this encounter.

## 2017-05-19 ENCOUNTER — Encounter: Payer: Self-pay | Admitting: Family Medicine

## 2017-05-19 ENCOUNTER — Ambulatory Visit: Payer: Medicare Other | Admitting: Family Medicine

## 2017-05-19 VITALS — BP 112/70 | HR 66 | Temp 98.5°F | Ht 62.0 in | Wt 159.6 lb

## 2017-05-19 DIAGNOSIS — M25561 Pain in right knee: Secondary | ICD-10-CM

## 2017-05-19 DIAGNOSIS — G8929 Other chronic pain: Secondary | ICD-10-CM

## 2017-05-19 MED ORDER — DICLOFENAC SODIUM 1 % TD GEL: 4.0000 g | Freq: Three times a day (TID) | TRANSDERMAL | 3 refills | Status: DC | PRN

## 2017-05-19 NOTE — Progress Notes (Signed)
Knee Arthrocentesis with Injection Procedure Note  Pre-operative Diagnosis: right knee pain: osteoarthritis vs internal derangment (meniscus)  Post-operative Diagnosis: same  Indications: Symptom relief from osteoarthritis  Anesthesia: Lidocaine 1% without epinephrine without added sodium bicarbonate  Procedure Details   Verbal consent was obtained for the procedure. Universal time out taken.  The Knee joint was prepped with alcohol and an 18 gauge needle was inserted into the joint from the lateral approach.. Four ml 1% lidocaine and one ml of triamcinolone (KENALOG) 40mg /ml was then injected into the joint through the same needle. The needle was removed and the area cleansed and dressed.  Complications:  None; patient tolerated the procedure well.

## 2017-05-19 NOTE — Progress Notes (Signed)
I have reviewed results. Normal. Patient notified by letter. Please see letter for details. 

## 2017-05-19 NOTE — Progress Notes (Signed)
Please call patient: I have reviewed his/her lab results. All lab results are normal. Everything looks good. Thyroid level is improved so continue thyroid medication. Will send letter with results as well. Thanks.

## 2017-05-19 NOTE — Patient Instructions (Signed)
Rest your leg and ice for the next several days.   Please follow up if symptoms do not improve or as needed.

## 2017-05-22 ENCOUNTER — Telehealth: Payer: Self-pay | Admitting: Emergency Medicine

## 2017-05-22 NOTE — Telephone Encounter (Signed)
PA for Voltaren Gel 0.1 % Approved through 03/13/2018

## 2017-05-26 NOTE — Progress Notes (Signed)
Charges for Kenalog put in

## 2017-06-28 ENCOUNTER — Other Ambulatory Visit: Payer: Self-pay | Admitting: Family Medicine

## 2017-06-28 DIAGNOSIS — Z139 Encounter for screening, unspecified: Secondary | ICD-10-CM

## 2017-07-07 DIAGNOSIS — I4891 Unspecified atrial fibrillation: Secondary | ICD-10-CM | POA: Diagnosis not present

## 2017-07-07 DIAGNOSIS — Z7901 Long term (current) use of anticoagulants: Secondary | ICD-10-CM | POA: Diagnosis not present

## 2017-07-07 DIAGNOSIS — Z5181 Encounter for therapeutic drug level monitoring: Secondary | ICD-10-CM | POA: Diagnosis not present

## 2017-07-07 DIAGNOSIS — R791 Abnormal coagulation profile: Secondary | ICD-10-CM | POA: Diagnosis not present

## 2017-07-21 DIAGNOSIS — I480 Paroxysmal atrial fibrillation: Secondary | ICD-10-CM | POA: Diagnosis not present

## 2017-07-21 DIAGNOSIS — Z5181 Encounter for therapeutic drug level monitoring: Secondary | ICD-10-CM | POA: Diagnosis not present

## 2017-07-21 DIAGNOSIS — Z79899 Other long term (current) drug therapy: Secondary | ICD-10-CM | POA: Diagnosis not present

## 2017-07-21 DIAGNOSIS — R791 Abnormal coagulation profile: Secondary | ICD-10-CM | POA: Diagnosis not present

## 2017-07-21 DIAGNOSIS — Z7901 Long term (current) use of anticoagulants: Secondary | ICD-10-CM | POA: Diagnosis not present

## 2017-08-01 ENCOUNTER — Ambulatory Visit
Admission: RE | Admit: 2017-08-01 | Discharge: 2017-08-01 | Disposition: A | Discharge status: Home / Self Care | Payer: Medicare Other | Source: Ambulatory Visit | Attending: Family Medicine | Admitting: Family Medicine

## 2017-08-01 DIAGNOSIS — Z139 Encounter for screening, unspecified: Secondary | ICD-10-CM

## 2017-08-01 DIAGNOSIS — Z780 Asymptomatic menopausal state: Secondary | ICD-10-CM | POA: Diagnosis not present

## 2017-08-01 DIAGNOSIS — R791 Abnormal coagulation profile: Secondary | ICD-10-CM | POA: Diagnosis not present

## 2017-08-01 DIAGNOSIS — M8580 Other specified disorders of bone density and structure, unspecified site: Secondary | ICD-10-CM

## 2017-08-01 DIAGNOSIS — Z1231 Encounter for screening mammogram for malignant neoplasm of breast: Secondary | ICD-10-CM | POA: Diagnosis not present

## 2017-08-01 DIAGNOSIS — Z7901 Long term (current) use of anticoagulants: Secondary | ICD-10-CM | POA: Diagnosis not present

## 2017-08-01 DIAGNOSIS — I480 Paroxysmal atrial fibrillation: Secondary | ICD-10-CM | POA: Diagnosis not present

## 2017-08-01 DIAGNOSIS — M85851 Other specified disorders of bone density and structure, right thigh: Secondary | ICD-10-CM | POA: Diagnosis not present

## 2017-08-01 DIAGNOSIS — Z5181 Encounter for therapeutic drug level monitoring: Secondary | ICD-10-CM | POA: Diagnosis not present

## 2017-08-01 NOTE — Progress Notes (Signed)
Please call patient: I have reviewed his/her lab results. Her bone density results are stable. Has low bone mass; no new medications are needed at this time. Keep with weight bearing exercises and ca / vit D supplements. We will recheck in 2 years.

## 2017-08-03 ENCOUNTER — Other Ambulatory Visit: Payer: Self-pay | Admitting: Family Medicine

## 2017-08-03 MED ORDER — SYNTHROID 25 MCG PO TABS: 25.0000 ug | ORAL_TABLET | Freq: Every day | ORAL | 3 refills | Status: DC

## 2017-08-22 DIAGNOSIS — Z79899 Other long term (current) drug therapy: Secondary | ICD-10-CM | POA: Diagnosis not present

## 2017-08-22 DIAGNOSIS — R791 Abnormal coagulation profile: Secondary | ICD-10-CM | POA: Diagnosis not present

## 2017-08-22 DIAGNOSIS — I480 Paroxysmal atrial fibrillation: Secondary | ICD-10-CM | POA: Diagnosis not present

## 2017-08-22 DIAGNOSIS — Z7901 Long term (current) use of anticoagulants: Secondary | ICD-10-CM | POA: Diagnosis not present

## 2017-08-22 DIAGNOSIS — Z5181 Encounter for therapeutic drug level monitoring: Secondary | ICD-10-CM | POA: Diagnosis not present

## 2017-09-11 DIAGNOSIS — Z5181 Encounter for therapeutic drug level monitoring: Secondary | ICD-10-CM | POA: Diagnosis not present

## 2017-09-11 DIAGNOSIS — Z7901 Long term (current) use of anticoagulants: Secondary | ICD-10-CM | POA: Diagnosis not present

## 2017-09-11 DIAGNOSIS — Z79899 Other long term (current) drug therapy: Secondary | ICD-10-CM | POA: Diagnosis not present

## 2017-09-11 DIAGNOSIS — I480 Paroxysmal atrial fibrillation: Secondary | ICD-10-CM | POA: Diagnosis not present

## 2017-09-12 ENCOUNTER — Other Ambulatory Visit: Payer: Self-pay | Admitting: Family Medicine

## 2017-09-12 NOTE — Telephone Encounter (Signed)
Copied from [place] [id number]. Topic: Quick Communication - See Telephone Encounter >> Sep 12, 2017  2:41 PM Mylinda Latina, NT wrote: CRM for notification. See Telephone encounter for: 09/12/17. Patient called and states she needs a refill of her ALPRAZolam Duanne Moron) 0.5 MG tablet  [id number] - [place], [place] - [address] AT [place] [address] [phone] (Phone) [phone] (Fax)

## 2017-09-12 NOTE — Telephone Encounter (Signed)
Xanax refill Last Refill:03/22/17 #30 Last OV: 05/18/17  PCP: Dr. Jonni Sanger Pharmacy: [facility]   [address]

## 2017-09-13 NOTE — Telephone Encounter (Signed)
Patient checking status.

## 2017-09-13 NOTE — Telephone Encounter (Signed)
Pt would like to be contacted if possible

## 2017-09-15 MED ORDER — ALPRAZOLAM 0.5 MG PO TABS: 0.5000 mg | ORAL_TABLET | Freq: Every day | ORAL | 0 refills | Status: DC | PRN

## 2017-09-19 NOTE — Telephone Encounter (Signed)
Patient advised that rx was sent to the pharmacy.

## 2017-09-20 ENCOUNTER — Ambulatory Visit: Payer: Medicare Other | Admitting: Family Medicine

## 2017-09-26 DIAGNOSIS — Z7901 Long term (current) use of anticoagulants: Secondary | ICD-10-CM | POA: Diagnosis not present

## 2017-09-26 DIAGNOSIS — Z5181 Encounter for therapeutic drug level monitoring: Secondary | ICD-10-CM | POA: Diagnosis not present

## 2017-09-26 DIAGNOSIS — I480 Paroxysmal atrial fibrillation: Secondary | ICD-10-CM | POA: Diagnosis not present

## 2017-09-26 DIAGNOSIS — Z79899 Other long term (current) drug therapy: Secondary | ICD-10-CM | POA: Diagnosis not present

## 2017-11-23 ENCOUNTER — Other Ambulatory Visit: Payer: Self-pay | Admitting: Family Medicine

## 2017-11-23 NOTE — Telephone Encounter (Signed)
Received and reviewed medication refill request.  Request is appropriate and was approved.  Please see medication orders for details.  

## 2017-11-29 DIAGNOSIS — K219 Gastro-esophageal reflux disease without esophagitis: Secondary | ICD-10-CM | POA: Diagnosis not present

## 2017-11-29 DIAGNOSIS — E785 Hyperlipidemia, unspecified: Secondary | ICD-10-CM | POA: Diagnosis not present

## 2017-11-29 DIAGNOSIS — I480 Paroxysmal atrial fibrillation: Secondary | ICD-10-CM | POA: Diagnosis not present

## 2017-11-29 DIAGNOSIS — I10 Essential (primary) hypertension: Secondary | ICD-10-CM | POA: Diagnosis not present

## 2017-11-29 DIAGNOSIS — I2510 Atherosclerotic heart disease of native coronary artery without angina pectoris: Secondary | ICD-10-CM | POA: Diagnosis not present

## 2017-11-30 DIAGNOSIS — R9431 Abnormal electrocardiogram [ECG] [EKG]: Secondary | ICD-10-CM | POA: Diagnosis not present

## 2017-12-01 ENCOUNTER — Other Ambulatory Visit: Payer: Self-pay

## 2017-12-01 ENCOUNTER — Ambulatory Visit: Payer: Medicare Other | Admitting: Family Medicine

## 2017-12-01 ENCOUNTER — Encounter: Payer: Self-pay | Admitting: Family Medicine

## 2017-12-01 VITALS — BP 122/72 | HR 72 | Temp 97.5°F | Ht 62.0 in | Wt 156.6 lb

## 2017-12-01 DIAGNOSIS — K219 Gastro-esophageal reflux disease without esophagitis: Secondary | ICD-10-CM

## 2017-12-01 DIAGNOSIS — Z23 Encounter for immunization: Secondary | ICD-10-CM | POA: Diagnosis not present

## 2017-12-01 DIAGNOSIS — Z1211 Encounter for screening for malignant neoplasm of colon: Secondary | ICD-10-CM

## 2017-12-01 DIAGNOSIS — F339 Major depressive disorder, recurrent, unspecified: Secondary | ICD-10-CM

## 2017-12-01 DIAGNOSIS — I10 Essential (primary) hypertension: Secondary | ICD-10-CM | POA: Diagnosis not present

## 2017-12-01 DIAGNOSIS — Z1212 Encounter for screening for malignant neoplasm of rectum: Secondary | ICD-10-CM

## 2017-12-01 DIAGNOSIS — F411 Generalized anxiety disorder: Secondary | ICD-10-CM

## 2017-12-01 DIAGNOSIS — M19041 Primary osteoarthritis, right hand: Secondary | ICD-10-CM

## 2017-12-01 DIAGNOSIS — M19042 Primary osteoarthritis, left hand: Secondary | ICD-10-CM

## 2017-12-01 DIAGNOSIS — K225 Diverticulum of esophagus, acquired: Secondary | ICD-10-CM

## 2017-12-01 MED ORDER — DICLOFENAC SODIUM 1 % TD GEL: 2.0000 g | Freq: Four times a day (QID) | TRANSDERMAL | 5 refills | Status: DC

## 2017-12-01 NOTE — Patient Instructions (Addendum)
Please return in 6 months for your annual complete physical; please come fasting. Medicare recommends an Annual Wellness Visit for all patients. Please schedule this to be done with our Nurse Educator, Maudie Mercury. This is an informative "talk" visit; it's goals are to ensure that your health care needs are being met and to give you education regarding avoiding falls, ensuring you are not suffering from depression or problems with memory or thinking, and to educate you on Advance Care Planning. It helps me take good care of you!    If you have any questions or concerns, please don't hesitate to send me a message via MyChart or call the office at [phone]. Thank you for visiting with [place] today! It's our pleasure caring for you.

## 2017-12-01 NOTE — Progress Notes (Signed)
Subjective  CC:  Chief Complaint  Patient presents with  . Hypertension    doing well,  wants flu shot today   . Depression    Patient on Zoloft, doing great per patient   . Gastroesophageal Reflux    Patient states that Acid Reflux has been worse    HPI: Brittney Cochran is a 77 y.o. female who presents to the office today to address the problems listed above in the chief complaint.  Hypertension f/u: Control is good . Pt reports she is doing well. . Feels well. denies adverse effects from his BP medications. Compliance with medication is good.   He has a long list of questions:  Major depression is well controlled on high-dose Zoloft.  Stressors from her son have decreased.  She is coping well with managing both households.  Continues to use rare Xanax for panic related anxiety.  No adverse effects  Increased dysphasia, heartburn and reflux symptoms.  Has known GERD on chronic PPI.  Has history of Zenker's diverticulum last evaluated in 2015 by both gastroenterology and ENT.  At that time it was too small for any need for management.  However patient is having more GERD symptoms.  She is also due for colorectal cancer screening with colonoscopy.  No melena, no choking, no weight loss.  Continues to have pain for last arthritis of bilateral hands.  Has not been able to get Voltaren gel but would like to.  She cannot use NSAIDs given her long-term history of Coumadin use.  No swelling  Health maintenance up-to-date, due for influenza and colonoscopy  Assessment  1. Benign essential hypertension   2. Major depression, recurrent, chronic ([place])   3. Generalized anxiety disorder   4. Gastro-esophageal reflux disease without esophagitis      Plan    Hypertension f/u: BP control is well controlled.  This medical condition is well controlled. There are no signs of complications, medication side effects, or red flags. Patient is instructed to continue the current treatment plan without  change in therapies or medications.  GERD and Zenker's diverticulum: Given progressive symptoms, refer back to gastroenterology, Dr. Percell Miller.  May need endoscopy and/or dilatation.  Continue PPI.  Also will need colonoscopy.  Referral placed  Depression is well controlled anxiety is well controlled continue current medications  Osteoarthritis of hands: We will try to get Voltaren gel.  May need approval or prior authorization  High-dose influenza vaccination given  Education regarding management of these chronic disease states was given. Management strategies discussed on successive visits include dietary and exercise recommendations, goals of achieving and maintaining IBW, and lifestyle modifications aiming for adequate sleep and minimizing stressors.   Follow up: Return in about 6 months (around 06/01/2018) for complete physical, AWV.  No orders of the defined types were placed in this encounter.  No orders of the defined types were placed in this encounter.     BP Readings from Last 3 Encounters:  12/01/17 122/72  05/19/17 112/70  05/18/17 102/64   Wt Readings from Last 3 Encounters:  12/01/17 156 lb 9.6 oz (71 kg)  05/19/17 159 lb 9.6 oz (72.4 kg)  05/18/17 158 lb (71.7 kg)    Lab Results  Component Value Date   CHOL 140 05/18/2017   Lab Results  Component Value Date   HDL 80.00 05/18/2017   Lab Results  Component Value Date   LDLCALC 39 05/18/2017   Lab Results  Component Value Date   TRIG 104.0 05/18/2017  Lab Results  Component Value Date   CHOLHDL 2 05/18/2017   No results found for: LDLDIRECT Lab Results  Component Value Date   CREATININE 0.85 05/18/2017   BUN 17 05/18/2017   NA 138 05/18/2017   K 4.3 05/18/2017   CL 102 05/18/2017   CO2 29 05/18/2017    The ASCVD Risk score (Goff DC Jr., et al., 2013) failed to calculate for the following reasons:   The patient has a prior MI or stroke diagnosis  I reviewed the patients updated PMH, FH, and  SocHx.    Patient Active Problem List   Diagnosis Date Noted  . Long term current use of anticoagulant therapy 07/07/2015    Priority: High  . Major depression, recurrent, chronic ([place]) 02/12/2015    Priority: High  . Atherosclerotic heart disease of native coronary artery without angina pectoris 05/16/2012    Priority: High  . Generalized anxiety disorder 01/10/2012    Priority: High  . History of non-ST elevation myocardial infarction (NSTEMI) 12/05/2011    Priority: High  . Mixed hyperlipidemia 12/31/2010    Priority: High  . Paroxysmal atrial fibrillation ([place]) 12/14/2010    Priority: High  . Benign essential hypertension 01/09/2009    Priority: High  . Subclinical hypothyroidism 03/24/2017    Priority: Medium  . DJD (degenerative joint disease), cervical 05/12/2016    Priority: Medium  . Foraminal stenosis of cervical region 08/18/2015    Priority: Medium  . Chronic pain of both knees 02/12/2015    Priority: Medium  . Zenker diverticulum 05/08/2014    Priority: Medium  . Osteopenia 06/08/2012    Priority: Medium  . Gastro-esophageal reflux disease without esophagitis 08/12/2011    Priority: Medium  . Osteoarthritis, hand 12/31/2010    Priority: Low  . Rosacea 12/31/2010    Priority: Low    Allergies: Patient has no known allergies.  Social History: Patient  reports that she has never smoked. She has never used smokeless tobacco. She reports that she drinks about 2.0 - 3.0 standard drinks of alcohol per week. She reports that she does not use drugs.  Current Meds  Medication Sig  . aspirin EC 81 MG tablet Take 81 mg by mouth.  . carvedilol (COREG) 12.5 MG tablet TK 1 T PO BID WITH MEALS  . Co-Enzyme Q-10 30 MG CAPS Take by mouth.  . fluticasone (FLONASE) 50 MCG/ACT nasal spray USE 1 SPRAY NASALLY DAILY  . lisinopril (PRINIVIL,ZESTRIL) 2.5 MG tablet Take 2.5 mg by mouth daily.   Marland Kitchen omeprazole (PRILOSEC) 40 MG capsule Take 40 mg by mouth daily.   . ranolazine  (RANEXA) 500 MG 12 hr tablet Take 500 mg by mouth 2 (two) times daily.   . rosuvastatin (CRESTOR) 40 MG tablet Take 40 mg by mouth daily.   . sertraline (ZOLOFT) 100 MG tablet TAKE 1 AND 1/2 TABLETS BY MOUTH DAILY  . SYNTHROID 25 MCG tablet Take 1 tablet (25 mcg total) by mouth daily before breakfast.  . warfarin (COUMADIN) 2 MG tablet TAKE 1 TABLET BY MOUTH DAILY, EXCEPT TAKE 2 TABLETS ON WEDNESDAY, SATURDAY, AND SUNDAY OR AS DIRECTED    Review of Systems: Cardiovascular: negative for chest pain, palpitations, leg swelling, orthopnea Respiratory: negative for SOB, wheezing or persistent cough Gastrointestinal: negative for abdominal pain Genitourinary: negative for dysuria or gross hematuria  Objective  Vitals: BP 122/72   Pulse 72   Temp (!) 97.5 F (36.4 C)   Ht 5\' 2"  (1.575 m)   Wt 156  lb 9.6 oz (71 kg)   BMI 28.64 kg/m  General: no acute distress  Psych:  Alert and oriented, normal mood and affect HEENT:  Normocephalic, atraumatic, supple neck  Cardiovascular:  RRR without murmur. no edema Respiratory:  Good breath sounds bilaterally, CTAB with normal respiratory effort Skin:  Warm, no rashes Neurologic:   Mental status is normal Hands with osteoarthritic changes throughout, no active synovitis  Commons side effects, risks, benefits, and alternatives for medications and treatment plan prescribed today were discussed, and the patient expressed understanding of the given instructions. Patient is instructed to call or message via MyChart if he/she has any questions or concerns regarding our treatment plan. No barriers to understanding were identified. We discussed Red Flag symptoms and signs in detail. Patient expressed understanding regarding what to do in case of urgent or emergency type symptoms.   Medication list was reconciled, printed and provided to the patient in AVS. Patient instructions and summary information was reviewed with the patient as documented in the AVS. This  note was prepared with assistance of Dragon voice recognition software. Occasional wrong-word or sound-a-like substitutions may have occurred due to the inherent limitations of voice recognition software

## 2017-12-11 DIAGNOSIS — K225 Diverticulum of esophagus, acquired: Secondary | ICD-10-CM | POA: Diagnosis not present

## 2017-12-11 DIAGNOSIS — K219 Gastro-esophageal reflux disease without esophagitis: Secondary | ICD-10-CM | POA: Diagnosis not present

## 2017-12-11 DIAGNOSIS — Z1211 Encounter for screening for malignant neoplasm of colon: Secondary | ICD-10-CM | POA: Diagnosis not present

## 2017-12-12 DIAGNOSIS — Z7901 Long term (current) use of anticoagulants: Secondary | ICD-10-CM | POA: Diagnosis not present

## 2017-12-12 DIAGNOSIS — Z5181 Encounter for therapeutic drug level monitoring: Secondary | ICD-10-CM | POA: Diagnosis not present

## 2017-12-12 DIAGNOSIS — R791 Abnormal coagulation profile: Secondary | ICD-10-CM | POA: Diagnosis not present

## 2017-12-12 DIAGNOSIS — Z79899 Other long term (current) drug therapy: Secondary | ICD-10-CM | POA: Diagnosis not present

## 2017-12-12 DIAGNOSIS — I480 Paroxysmal atrial fibrillation: Secondary | ICD-10-CM | POA: Diagnosis not present

## 2018-01-09 DIAGNOSIS — L821 Other seborrheic keratosis: Secondary | ICD-10-CM | POA: Diagnosis not present

## 2018-01-09 DIAGNOSIS — L718 Other rosacea: Secondary | ICD-10-CM | POA: Diagnosis not present

## 2018-01-16 DIAGNOSIS — Z7901 Long term (current) use of anticoagulants: Secondary | ICD-10-CM | POA: Diagnosis not present

## 2018-01-16 DIAGNOSIS — I4821 Permanent atrial fibrillation: Secondary | ICD-10-CM | POA: Diagnosis not present

## 2018-01-16 DIAGNOSIS — Z5181 Encounter for therapeutic drug level monitoring: Secondary | ICD-10-CM | POA: Diagnosis not present

## 2018-01-16 DIAGNOSIS — Z79899 Other long term (current) drug therapy: Secondary | ICD-10-CM | POA: Diagnosis not present

## 2018-02-14 DIAGNOSIS — I2510 Atherosclerotic heart disease of native coronary artery without angina pectoris: Secondary | ICD-10-CM | POA: Diagnosis not present

## 2018-02-14 DIAGNOSIS — S066X0A Traumatic subarachnoid hemorrhage without loss of consciousness, initial encounter: Secondary | ICD-10-CM | POA: Diagnosis not present

## 2018-02-14 DIAGNOSIS — I6523 Occlusion and stenosis of bilateral carotid arteries: Secondary | ICD-10-CM | POA: Diagnosis not present

## 2018-02-14 DIAGNOSIS — R55 Syncope and collapse: Secondary | ICD-10-CM | POA: Diagnosis not present

## 2018-02-14 DIAGNOSIS — Z7901 Long term (current) use of anticoagulants: Secondary | ICD-10-CM | POA: Diagnosis not present

## 2018-02-14 DIAGNOSIS — I4811 Longstanding persistent atrial fibrillation: Secondary | ICD-10-CM | POA: Diagnosis not present

## 2018-02-14 DIAGNOSIS — I619 Nontraumatic intracerebral hemorrhage, unspecified: Secondary | ICD-10-CM | POA: Diagnosis not present

## 2018-02-14 DIAGNOSIS — S06360A Traumatic hemorrhage of cerebrum, unspecified, without loss of consciousness, initial encounter: Secondary | ICD-10-CM | POA: Diagnosis not present

## 2018-02-14 DIAGNOSIS — Z955 Presence of coronary angioplasty implant and graft: Secondary | ICD-10-CM | POA: Diagnosis not present

## 2018-02-14 DIAGNOSIS — S06359A Traumatic hemorrhage of left cerebrum with loss of consciousness of unspecified duration, initial encounter: Secondary | ICD-10-CM | POA: Diagnosis not present

## 2018-02-14 DIAGNOSIS — S066X9A Traumatic subarachnoid hemorrhage with loss of consciousness of unspecified duration, initial encounter: Secondary | ICD-10-CM | POA: Diagnosis not present

## 2018-02-14 DIAGNOSIS — E039 Hypothyroidism, unspecified: Secondary | ICD-10-CM | POA: Diagnosis not present

## 2018-02-14 DIAGNOSIS — I609 Nontraumatic subarachnoid hemorrhage, unspecified: Secondary | ICD-10-CM | POA: Diagnosis not present

## 2018-02-14 DIAGNOSIS — S06340A Traumatic hemorrhage of right cerebrum without loss of consciousness, initial encounter: Secondary | ICD-10-CM | POA: Diagnosis not present

## 2018-02-14 DIAGNOSIS — I10 Essential (primary) hypertension: Secondary | ICD-10-CM | POA: Diagnosis not present

## 2018-02-14 DIAGNOSIS — I252 Old myocardial infarction: Secondary | ICD-10-CM | POA: Diagnosis not present

## 2018-02-14 DIAGNOSIS — G8911 Acute pain due to trauma: Secondary | ICD-10-CM | POA: Diagnosis not present

## 2018-02-14 DIAGNOSIS — K219 Gastro-esophageal reflux disease without esophagitis: Secondary | ICD-10-CM | POA: Diagnosis not present

## 2018-02-14 DIAGNOSIS — I4891 Unspecified atrial fibrillation: Secondary | ICD-10-CM | POA: Diagnosis not present

## 2018-02-14 DIAGNOSIS — W19XXXA Unspecified fall, initial encounter: Secondary | ICD-10-CM | POA: Diagnosis not present

## 2018-02-14 DIAGNOSIS — S065X0A Traumatic subdural hemorrhage without loss of consciousness, initial encounter: Secondary | ICD-10-CM | POA: Diagnosis not present

## 2018-02-14 DIAGNOSIS — Z7982 Long term (current) use of aspirin: Secondary | ICD-10-CM | POA: Diagnosis not present

## 2018-02-14 DIAGNOSIS — S199XXA Unspecified injury of neck, initial encounter: Secondary | ICD-10-CM | POA: Diagnosis not present

## 2018-02-14 DIAGNOSIS — S065X9A Traumatic subdural hemorrhage with loss of consciousness of unspecified duration, initial encounter: Secondary | ICD-10-CM | POA: Diagnosis not present

## 2018-02-22 ENCOUNTER — Other Ambulatory Visit: Payer: Self-pay | Admitting: Family Medicine

## 2018-02-22 MED ORDER — ALPRAZOLAM 0.5 MG PO TABS: 0.5000 mg | ORAL_TABLET | Freq: Every day | ORAL | 0 refills | Status: DC | PRN

## 2018-02-22 NOTE — Telephone Encounter (Signed)
Last OV: 12/01/2017 Last Fill: 09/15/2017, #30 with 0 RF

## 2018-02-22 NOTE — Telephone Encounter (Signed)
Copied from [place] [id number]. Topic: General - Other >> Feb 22, 2018 12:24 PM Janace Aris A wrote: Medication: ALPRAZolam Duanne Moron) 0.5 MG tablet  Has the patient contacted their pharmacy?yes  Preferred Pharmacy (with phone number or street name): [id number] - [place], [place] - [address] AT [place] [phone] (Phone) [phone] (Fax)    Agent: Please be advised that RX refills may take up to 3 business days. We ask that you follow-up with your pharmacy.

## 2018-02-23 ENCOUNTER — Other Ambulatory Visit: Payer: Self-pay

## 2018-02-23 ENCOUNTER — Encounter: Payer: Self-pay | Admitting: Family Medicine

## 2018-02-23 ENCOUNTER — Ambulatory Visit: Payer: Medicare Other | Admitting: Family Medicine

## 2018-02-23 VITALS — BP 122/80 | HR 79 | Temp 97.9°F | Ht 62.0 in | Wt 146.6 lb

## 2018-02-23 DIAGNOSIS — Z7901 Long term (current) use of anticoagulants: Secondary | ICD-10-CM | POA: Diagnosis not present

## 2018-02-23 DIAGNOSIS — R2681 Unsteadiness on feet: Secondary | ICD-10-CM

## 2018-02-23 DIAGNOSIS — I609 Nontraumatic subarachnoid hemorrhage, unspecified: Secondary | ICD-10-CM | POA: Diagnosis not present

## 2018-02-23 DIAGNOSIS — S065X9A Traumatic subdural hemorrhage with loss of consciousness of unspecified duration, initial encounter: Secondary | ICD-10-CM | POA: Diagnosis not present

## 2018-02-23 DIAGNOSIS — I480 Paroxysmal atrial fibrillation: Secondary | ICD-10-CM

## 2018-02-23 DIAGNOSIS — S065XAA Traumatic subdural hemorrhage with loss of consciousness status unknown, initial encounter: Secondary | ICD-10-CM

## 2018-02-23 DIAGNOSIS — R29898 Other symptoms and signs involving the musculoskeletal system: Secondary | ICD-10-CM | POA: Diagnosis not present

## 2018-02-23 NOTE — Patient Instructions (Signed)
Please return in 4 weeks for recheck  I will refer you to Neurosurgery ASAP. Please call your cardiologist for follow up on if you need to continue your coumadin.  For now, do not take coumadin or aspirin.  No advil or ibuprofen either.   I am ordering PT and OT in [place] as well.  Call me if you need anything.   If you have any questions or concerns, please don't hesitate to send me a message via MyChart or call the office at [phone]. Thank you for visiting with [place] today! It's our pleasure caring for you.

## 2018-02-23 NOTE — Progress Notes (Signed)
Subjective  CC:  Chief Complaint  Patient presents with  . Hospitalization Follow-up    [place] the [place], was hospitalized for 9 days, had a bleed in her brain. Discharged on 02/21/2018     HPI: Brittney Cochran is a 77 y.o. female who presents to [place] [facility] today to establish care with me as a new patient.   She has the following concerns or needs:  Date of injury February 14, 2018.  Patient was walking into her kitchen from the garage and tripped.  She remembers hitting her head.  She denies loss of consciousness.  She is on aspirin and Coumadin for history of paroxysmal atrial fibrillation.  For this reason EMS was called.  Evaluation at that time was normal.  They did not recommend ER evaluation.  Patient slept well.  She was out shopping with her husband next morning and had acute onset of right hand weakness.  She was brought to the emergency room where she was diagnosed with a traumatic subarachnoid hemorrhage, subdural hematoma with neurologic deficits.  I do not currently have those records for review and I am requesting them.  She was hospitalized for 1 week.  Her bleeding stabilized as did her neurologic exam.  She was discharged yesterday.  She returned to [place] with her husband.  She continues to have some cognitive slowing, right hand weakness and unsteady gait.  She denies headache, visual changes.  She was sent home on a short course of [place] preventatively.  They recommend physical therapy and Occupational Therapy.  She does need follow-up with a neurosurgeon.  Assessment  1. SAH (subarachnoid hemorrhage) ([place])   2. SDH (subdural hematoma) ([facility])   3. Paroxysmal atrial fibrillation ([place])   4. Long term current use of anticoagulant therapy      Plan   Traumatic subarachnoid hemorrhage and subdural hematoma with neurologic deficits: Refer to neurosurgery for further evaluation and treatment.  Holding anticoagulants for the  next 2 weeks at least.  Will refer to occupational therapy and physical therapy.  Patient's husband can drive her.  Rest and good nutrition advised.  Recheck in 4 weeks  PAF status post ablation: Recommend evaluation with cardiology to see if she warrants further Coumadin treatment.  Await hospital records.  Follow up:  Return in about 4 weeks (around 03/23/2018) for recheck. No orders of the defined types were placed in this encounter.  No orders of the defined types were placed in this encounter.      We updated and reviewed the patient's past history in detail and it is documented below.  Patient Active Problem List   Diagnosis Date Noted  . Long term current use of anticoagulant therapy 07/07/2015    Priority: High  . Major depression, recurrent, chronic ([place]) 02/12/2015    Priority: High  . Atherosclerotic heart disease of native coronary artery without angina pectoris 05/16/2012    Priority: High    Overview:  Cardiac MRI - EF normalized to 55% from 40%, no wall motion abnormalities nor scarring For lexiscan stress test 07/2013 - unremarkable/cla Overview:  Overview:  Cardiac MRI - EF normalized to 55% from 40%, no wall motion abnormalities nor scarring For lexiscan stress test 07/2013 - unremarkable/cla   . Generalized anxiety disorder 01/10/2012    Priority: High    Overview:  Triggers are winter months, snow, darkness, family problems: has panic sxs. Controlled on prn xanax and zolft. Overview:  Overview:  Triggers are  winter months, snow, darkness, family problems: has panic sxs. Controlled on prn xanax and zolft.   . History of non-ST elevation myocardial infarction (NSTEMI) 12/05/2011    Priority: High    Overview:  12/02/11 - [place] [facility], s/p PTCA RCA, nonSTEMI EF 60%   . Mixed hyperlipidemia 12/31/2010    Priority: High    Overview:  Goal LDL < 80; cards managing   . Paroxysmal atrial fibrillation ([facility]) 12/14/2010    Priority: High    Overview:    [facility] follows coumadin levels Heart doc said ok to use celebrex Overview:  Overview:  [facility] [facility] follows coumadin levels Heart doc said ok to use celebrex Overview:  IMPRESSION: Stable.  She sees Dr.  Elonda Husky.  dz   . Essential hypertension 01/09/2009    Priority: High  . Subclinical hypothyroidism 03/24/2017    Priority: Medium    Started synthroid 25 03/2017 for increasing TSH.   . DJD (degenerative joint disease), cervical 05/12/2016    Priority: Medium  . Foraminal stenosis of cervical region 08/18/2015    Priority: Medium    Overview:  xrays 2017; multilevel DJD   . Chronic pain of both knees 02/12/2015    Priority: Medium  . Zenker diverticulum 05/08/2014    Priority: Medium    Overview:  By upper GI study.  GI is following.  Patient hoping to avoid surgical repair.  2016   . Osteopenia 06/08/2012    Priority: Medium    Overview:  T=-0.2 lumbar spine, -1.6 at left femur. Stable 2016, mild worsening Overview:  Overview:  T=-0.2 lumbar spine, -1.6 at left femur. Recheck 2-3 years. 05/2012. Dexa 07/2017: stable at femur; osteopenia. Recheck 2 years.    . Gastro-esophageal reflux disease without esophagitis 08/12/2011    Priority: Medium  . Osteoarthritis, hand 12/31/2010    Priority: Low  . Rosacea 12/31/2010    Priority: Low   Health Maintenance  Topic Date Due  . MAMMOGRAM  08/02/2018  . DEXA SCAN  08/02/2019  . INFLUENZA VACCINE  Completed  . PNA vac Low Risk Adult  Completed   Immunization History  Administered Date(s) Administered  . Influenza Split 01/11/2007, 02/04/2008, 01/01/2009, 04/19/2010, 12/14/2010, 01/12/2011  . Influenza, High Dose Seasonal PF 11/29/2011, 12/18/2012, 12/10/2013, 02/02/2015, 12/16/2016, 12/01/2017  . Influenza, Seasonal, Injecte, Preservative Fre 03/14/2009, 12/08/2015  . Influenza-Unspecified 03/14/2009  . Pneumococcal Conjugate-13 04/08/2014  . Pneumococcal Polysaccharide-23 01/01/2009, 03/14/2009  .  Pneumococcal-Unspecified 03/14/2009  . Tdap 03/14/2004, 03/14/2006   Current Meds  Medication Sig  . butalbital-acetaminophen-caffeine (FIORICET WITH CODEINE) 50-325-40-30 MG capsule Take 1 capsule by mouth every 4 (four) hours as needed for headache.  . carvedilol (COREG) 12.5 MG tablet TK 1 T PO BID WITH MEALS  . Co-Enzyme Q-10 30 MG CAPS Take by mouth.  . diclofenac sodium (VOLTAREN) 1 % GEL Apply 2 g topically 4 (four) times daily. To hands as needed  . docusate sodium (COLACE) 100 MG capsule Take 100 mg by mouth 2 (two) times daily.  . fluticasone (FLONASE) 50 MCG/ACT nasal spray USE 1 SPRAY NASALLY DAILY  . levETIRAcetam (KEPPRA) 1000 MG tablet Take 1,000 mg by mouth 2 (two) times daily.  Marland Kitchen lisinopril (PRINIVIL,ZESTRIL) 2.5 MG tablet Take 2.5 mg by mouth daily.   . metroNIDAZOLE (METROGEL) 1 % gel Apply topically.  . nitroGLYCERIN (NITROSTAT) 0.4 MG SL tablet Place 0.4 mg under the tongue.  Marland Kitchen omeprazole (PRILOSEC) 40 MG capsule Take 40 mg by mouth daily.   . ranolazine (RANEXA) 500  MG 12 hr tablet Take 500 mg by mouth 2 (two) times daily.   . rosuvastatin (CRESTOR) 40 MG tablet Take 40 mg by mouth daily.   . sertraline (ZOLOFT) 100 MG tablet TAKE 1 AND 1/2 TABLETS BY MOUTH DAILY  . SYNTHROID 25 MCG tablet Take 1 tablet (25 mcg total) by mouth daily before breakfast.  . VITAMIN D, CHOLECALCIFEROL, PO Take by mouth.    Allergies: Patient has No Known Allergies. Past Medical History Patient  has a past medical history of Atrial fibrillation ([place]), Coronary artery disease, GERD (gastroesophageal reflux disease), Hyperlipemia, Leiomyoma of body of uterus, Osteopenia, and Subclinical hypothyroidism (03/24/2017). Past Surgical History Patient  has a past surgical history that includes Breast biopsy (Left, 08/19/2013); Ablation of dysrhythmic focus; Bladder repair (2011); Tubal ligation (1985); Floating Kidney [id number]); Carotid stent (12/04/2011); and Esophageal dilation (2013). Family  History: Patient family history includes Alcohol abuse in her father; Atrial fibrillation in her mother; Diabetes in her brother, father, and paternal grandmother; Early death in her father and maternal grandfather; Heart attack in her father, maternal uncle, and paternal grandmother; Kidney disease in her maternal grandmother; Stroke in her maternal aunt, maternal grandfather, paternal aunt, and son; Transient ischemic attack in her mother. Social History:  Patient  reports that she has never smoked. She has never used smokeless tobacco. She reports current alcohol use of about 2.0 - 3.0 standard drinks of alcohol per week. She reports that she does not use drugs.  Review of Systems: Constitutional: negative for fever or malaise Ophthalmic: negative for photophobia, double vision or loss of vision Cardiovascular: negative for chest pain, dyspnea on exertion, or new LE swelling Respiratory: negative for SOB or persistent cough Gastrointestinal: negative for abdominal pain, change in bowel habits or melena Genitourinary: negative for dysuria or gross hematuria Musculoskeletal: negative for new gait disturbance or muscular weakness Integumentary: negative for new or persistent rashes Neurological: Positive persistent right hand weakness, positive balance disturbance Psychiatric: negative for SI or delusions Allergic/Immunologic: negative for hives  Patient Care Team    Relationship Specialty Notifications Start End  Leamon Arnt, MD PCP - General Family Medicine  03/21/17     Objective  Vitals: BP 122/80   Pulse 79   Temp 97.9 F (36.6 C)   Ht 5\' 2"  (1.575 m)   Wt 146 lb 9.6 oz (66.5 kg)   SpO2 98%   BMI 26.81 kg/m  General:  Well developed, well nourished, appears tired, disheveled Psych:  Alert and oriented x3,normal mood and affect, slowed speech but not dysarthric HEENT:  Normocephalic, atraumatic, non-icteric sclera, PERRL, oropharynx is without mass or exudate, supple neck  without adenopathy, mass or thyromegaly Cardiovascular:  RRR without gallop, rub or murmur, nondisplaced PMI Respiratory:  Good breath sounds bilaterally, CTAB with normal respiratory effort Gastrointestinal: normal bowel sounds, soft, non-tender, no noted masses. No HSM MSK: no deformities, contusions. Joints are without erythema or swelling Skin:  Warm, no rashes or suspicious lesions noted Neurologic:    Cranial nerves II through XII intact, decreased right grip and wrist extension.  Strength 5 out of 5 elsewhere.  Unsteady gait.   Commons side effects, risks, benefits, and alternatives for medications and treatment plan prescribed today were discussed, and the patient expressed understanding of the given instructions. Patient is instructed to call or message via MyChart if he/she has any questions or concerns regarding our treatment plan. No barriers to understanding were identified. We discussed Red Flag symptoms and signs in detail. Patient expressed  understanding regarding what to do in case of urgent or emergency type symptoms.   Medication list was reconciled, printed and provided to the patient in AVS. Patient instructions and summary information was reviewed with the patient as documented in the AVS. This note was prepared with assistance of Dragon voice recognition software. Occasional wrong-word or sound-a-like substitutions may have occurred due to the inherent limitations of voice recognition software

## 2018-02-28 ENCOUNTER — Telehealth: Payer: Self-pay

## 2018-02-28 ENCOUNTER — Ambulatory Visit: Payer: Medicare Other | Attending: Family Medicine | Admitting: Physical Therapy

## 2018-02-28 ENCOUNTER — Encounter: Payer: Self-pay | Admitting: Physical Therapy

## 2018-02-28 DIAGNOSIS — R2689 Other abnormalities of gait and mobility: Secondary | ICD-10-CM

## 2018-02-28 DIAGNOSIS — M6281 Muscle weakness (generalized): Secondary | ICD-10-CM | POA: Diagnosis not present

## 2018-02-28 DIAGNOSIS — M25341 Other instability, right hand: Secondary | ICD-10-CM | POA: Diagnosis not present

## 2018-02-28 NOTE — Patient Instructions (Signed)
Access Code: [email or web address]  URL: [email or web address]  Date: 02/28/2018  Prepared by: Elsie Ra   Exercises  Backward Walking with Counter Support - 3-5 reps - 1 sets - 2x daily - 6x weekly  Side Stepping with Counter Support - 3-5 reps - 1 sets - 2x daily - 6x weekly  Standing Tandem Balance with Counter Support - 3 reps - 1 sets - 30 hold - 2x daily - 6x weekly  Standing Single Leg Stance with Counter Support - 10 reps - 3 sets - 2x daily - 6x weekly  Sit to Stand without Arm Support - 10 reps - 1-2 sets - 2x daily - 6x weekly  Putty Squeezes - 10 reps - 3 sets - 2x daily - 6x weekly  Seated Palmar Pinch with Putty - 10 reps - 3 sets - 2x daily - 6x weekly  Seated Finger Abduction with Putty - 10 reps - 3 sets - 2x daily - 6x weekly  Finger Pinch and Pull with Putty - 10 reps - 3 sets - 2x daily - 6x weekly

## 2018-02-28 NOTE — Telephone Encounter (Signed)
Pt needs to sign medical release form before we can send records.

## 2018-02-28 NOTE — Therapy (Signed)
[facility] [address]  [place], [place], [address] Phone: [phone]   Fax:  [phone]  Physical Therapy Evaluation  Patient Details  Name: Brittney Cochran MRN: [id number] Date of Birth: 09-13-1940 Referring Provider (PT): Billey Chang MD   Encounter Date: 02/28/2018  PT End of Session - 02/28/18 1614    Visit Number  1    Number of Visits  12    Date for PT Re-Evaluation  04/11/18    Authorization Type  [employer] MCR    PT Start Time  0245    PT Stop Time  0345    PT Time Calculation (min)  60 min    Activity Tolerance  Patient tolerated treatment well    Behavior During Therapy  [facility] for tasks assessed/performed       Past Medical History:  Diagnosis Date  . Atrial fibrillation ([place])   . Coronary artery disease   . GERD (gastroesophageal reflux disease)    endoscopy and esophageal diatation 2013 Dr. Percell Miller  . Hyperlipemia   . Leiomyoma of body of uterus   . Osteopenia   . Subclinical hypothyroidism 03/24/2017   Started synthroid 25 03/2017 for increasing TSH.    Past Surgical History:  Procedure Laterality Date  . ABLATION OF DYSRHYTHMIC FOCUS    . BLADDER REPAIR  2011  . BREAST BIOPSY Left 08/19/2013  . CAROTID STENT  12/04/2011  . ESOPHAGEAL DILATION  2013  . Floating Kidney  1963  . TUBAL LIGATION  1985    There were no vitals filed for this visit.   Subjective Assessment - 02/28/18 1601    Subjective  Pt relays she had fall on 12/13 walking in dark house while visiting someone elses house. She hit her head and then next day she could not use her right hand, had severe headache, and dizziness. She went to hospital and was found to have subarachnoid hemmorage and subdural hemotoma.. She stayed in hospital 8 days and was discharged home last week with RW for gait. She did not use AD prior to this fall. She is now having gait instability, she has lost strength in legs, and lost functional use of her  Rt hand however this is slowly improving. PLOF for Rt hand somewhat limited at baseline due to RA/OA    Patient is accompained by:  Family member   husband   Pertinent History  PMH-subarachnoid hemmorage and subdural hemotoma.Afib,CAD,MI,anx,dep,OA,cervical DDD and stenosis,osteopenia    Limitations  Lifting;Standing;Walking;Writing    Patient Stated Goals  get back to normal    Currently in Pain?  No/denies         [facility] PT Assessment - 02/28/18 0001      Assessment   Medical Diagnosis  unsteady gait, Rt hand weakness    Referring Provider (PT)  Billey Chang MD    Onset Date/Surgical Date  02/23/18   date of fall causing SAD   Hand Dominance  Right    Next MD Visit  4 weeks    Prior Therapy  acute PT       Precautions   Precautions  Fall    Required Braces or Orthoses  --   was prescribed wrist brace     Restrictions   Weight Bearing Restrictions  No      Balance Screen   Has the patient fallen in the past 6 months  Yes    How many times?  1    Has  the patient had a decrease in activity level because of a fear of falling?   Yes    Is the patient reluctant to leave their home because of a fear of falling?   No      Home Film/video editor residence    Living Arrangements  Spouse/significant other    Additional Comments  2 levels, 2 steps to enter which she can do but slow      Prior Function   Level of Independence  Independent with basic ADLs   did need help at baseline with opening jars   Vocation  Retired    Leisure  get out and walk      Cognition   Overall Cognitive Status  Within Functional Limits for tasks assessed    Attention  Focused    Memory  --   able to recall 3 items after 3-4 min     Sensation   Light Touch  Appears Intact      Coordination   Gross Motor Movements are Fluid and Coordinated  --   slightly ataxic   Finger Nose Finger Test  slightly decreased    Heel Shin Test  normal      ROM / Strength   AROM / PROM  / Strength  AROM;Strength      AROM   Overall AROM   Within functional limits for tasks performed      Strength   Overall Strength Comments  4+/5 grossly UE/LE tested in sitting, grip strength Lt 10 lbs, Rt only 1 lb      Ambulation/Gait   Ambulation/Gait  Yes    Ambulation/Gait Assistance  5: Supervision    Ambulation/Gait Assistance Details  mod I with RW, supervsion without    Ambulation Distance (Feet)  90 Feet   one with RW, one with SPC, one without AD   Gait Comments  slow, ataxic, inconsistent stability      Balance   Balance Assessed  Yes      Standardized Balance Assessment   Standardized Balance Assessment  Berg Balance Test;Dynamic Gait Index;Timed Up and Go Test;Five Times Sit to Stand    Five times sit to stand comments   20      Berg Balance Test   Sit to Stand  Able to stand without using hands and stabilize independently    Standing Unsupported  Able to stand 2 minutes with supervision    Sitting with Back Unsupported but Feet Supported on Floor or Stool  Able to sit safely and securely 2 minutes    Stand to Sit  Sits safely with minimal use of hands    Transfers  Able to transfer safely, minor use of hands    Standing Unsupported with Eyes Closed  Able to stand 10 seconds with supervision    Standing Ubsupported with Feet Together  Able to place feet together independently and stand 1 minute safely    From Standing, Reach Forward with Outstretched Arm  Can reach forward >12 cm safely (5")    From Standing Position, Pick up Object from Floor  Able to pick up shoe, needs supervision    From Standing Position, Turn to Look Behind Over each Shoulder  Looks behind from both sides and weight shifts well    Turn 360 Degrees  Able to turn 360 degrees safely one side only in 4 seconds or less    Standing Unsupported, Alternately Place Feet on Step/Stool  Able to complete  4 steps without aid or supervision    Standing Unsupported, One Foot in Front  Able to plae foot ahead  of the other independently and hold 30 seconds    Standing on One Leg  Tries to lift leg/unable to hold 3 seconds but remains standing independently    Total Score  45      Dynamic Gait Index   Level Surface  Normal    Change in Gait Speed  Normal    Gait with Horizontal Head Turns  Mild Impairment    Gait with Vertical Head Turns  Mild Impairment    Gait and Pivot Turn  Mild Impairment    Step Over Obstacle  Normal    Step Around Obstacles  Mild Impairment    Steps  Moderate Impairment    Total Score  18      Timed Up and Go Test   Normal TUG (seconds)  12.5                Objective measurements completed on examination: See above findings.              PT Education - 02/28/18 1613    Education Details  HEP,POC,exam findings, recommendation for SPC vs RW but plan to progress to no AD    Person(s) Educated  Patient    Methods  Explanation    Comprehension  Verbalized understanding;Need further instruction          PT Long Term Goals - 02/28/18 1622      PT LONG TERM GOAL #1   Title  Pt will be I and compliant with HEP. 6 weeks 04/11/18    Status  New      PT LONG TERM GOAL #2   Title  Pt will improve Rt hand grip to at least 4+/5 or 10 lbs. 6 weeks 04/11/18    Status  New      PT LONG TERM GOAL #3   Title  Pt will improve BERG >47 and DGI >19 to show reduced risk of falls. 6 weeks 04/11/18    Status  New      PT LONG TERM GOAL #4   Title  Pt will improve 5TSTS to 15 sec to show improved balance and endurance. 6 weeks 04/11/18    Status  New      PT LONG TERM GOAL #5   Title  Pt to ambulate community distances without AD and with good stability. 6 weeks 04/11/18    Status  New             Plan - 02/28/18 1617    Clinical Impression Statement  Pt presents with unsteady gait and Rt hand weakness following fall and subarachnoid hemmorage and subdural hemotoma causing 8 day stay in hospital. She is now using RW for gait but admits to not using  it much. PT feels she would be better with SPC due to balance testing today. She will benefit from skilled PT to address her deficits in strength, ROM, balance, gait, coordination, and neuromuscular control.     History and Personal Factors relevant to plan of care:  subarachnoid hemmorage and subdural hemotoma.Afib,CAD,MI,anx,dep,OA,cervical DDD and stenosis,osteopenia    Clinical Presentation  Evolving    Clinical Presentation due to:  mulitfactoral deficits and unsteadiness    Clinical Decision Making  Moderate    Rehab Potential  Good    PT Frequency  2x / week    PT Duration  6 weeks  PT Treatment/Interventions  Moist Heat;Gait training;Stair training;Therapeutic activities;Therapeutic exercise;Balance training;Neuromuscular re-education;Manual techniques;Passive range of motion    PT Next Visit Plan  review HEP, balance, gait, Rt hand strength and functional use    Consulted and Agree with Plan of Care  Patient       Patient will benefit from skilled therapeutic intervention in order to improve the following deficits and impairments:  Abnormal gait, Decreased activity tolerance, Decreased endurance, Decreased range of motion, Decreased strength, Decreased balance, Difficulty walking, Impaired UE functional use  Visit Diagnosis: Other abnormalities of gait and mobility  Other instability, right hand     Problem List Patient Active Problem List   Diagnosis Date Noted  . Subclinical hypothyroidism 03/24/2017  . DJD (degenerative joint disease), cervical 05/12/2016  . Foraminal stenosis of cervical region 08/18/2015  . Long term current use of anticoagulant therapy 07/07/2015  . Chronic pain of both knees 02/12/2015  . Major depression, recurrent, chronic ([place]) 02/12/2015  . Zenker diverticulum 05/08/2014  . Osteopenia 06/08/2012  . Atherosclerotic heart disease of native coronary artery without angina pectoris 05/16/2012  . Generalized anxiety disorder 01/10/2012  . History  of non-ST elevation myocardial infarction (NSTEMI) 12/05/2011  . Gastro-esophageal reflux disease without esophagitis 08/12/2011  . Mixed hyperlipidemia 12/31/2010  . Osteoarthritis, hand 12/31/2010  . Rosacea 12/31/2010  . Paroxysmal atrial fibrillation ([place]) 12/14/2010  . Essential hypertension 01/09/2009    Brittney Cochran 02/28/2018, 4:26 PM  [facility] [address]  [place], [place], [address] Phone: [phone]   Fax:  [phone]  Name: Brittney Cochran MRN: [id number] Date of Birth: Apr 26, 1940

## 2018-02-28 NOTE — Telephone Encounter (Signed)
Copied from [place] [id number]. Topic: Referral - Question >> Feb 28, 2018 10:02 AM Vernona Rieger wrote: Reason for CRM: Patient states that she contacted [facility] D[facility] asked her to please have Dr Jonni Sanger send over her CT scan results and any other test she may have had along with office visit notes. Fax [phone], she spoke with Nira Conn at the [place] office and that she was third in line for an appointment.

## 2018-02-28 NOTE — Telephone Encounter (Signed)
FYI

## 2018-02-28 NOTE — Telephone Encounter (Signed)
Spoke with patient.  This is not for Dermatology - it is for NeuroSurgery.    I spoke with Levada Dy and we have not received records for this patient yet.  Patient is coming in to the office tomorrow and we will need to let her know that we have not yet received those records from [facility].

## 2018-03-01 ENCOUNTER — Encounter: Payer: Self-pay | Admitting: Family Medicine

## 2018-03-01 ENCOUNTER — Other Ambulatory Visit: Payer: Self-pay

## 2018-03-01 ENCOUNTER — Ambulatory Visit: Payer: Medicare Other | Admitting: Family Medicine

## 2018-03-01 VITALS — BP 118/58 | HR 65 | Temp 98.1°F | Resp 16 | Ht 62.0 in | Wt 147.6 lb

## 2018-03-01 DIAGNOSIS — L858 Other specified epidermal thickening: Secondary | ICD-10-CM | POA: Diagnosis not present

## 2018-03-01 MED ORDER — FLUOROURACIL 5 % EX CREA: TOPICAL_CREAM | Freq: Three times a day (TID) | CUTANEOUS | 2 refills | Status: DC

## 2018-03-01 MED ORDER — CEPHALEXIN 500 MG PO CAPS: 500.0000 mg | ORAL_CAPSULE | Freq: Two times a day (BID) | ORAL | 0 refills | Status: DC

## 2018-03-01 NOTE — Patient Instructions (Signed)
We will call you with information regarding your referral appointment. Dermatology.  If you do not hear from [place] within the next 2 weeks, please let me know. It can take 1-2 weeks to get appointments set up with the specialists.   Use the topical cream 3x/day until you go to the dermatologist.

## 2018-03-01 NOTE — Progress Notes (Signed)
Subjective  CC:  Chief Complaint  Patient presents with  . Cyst    Top of left hand, 01/18/18 tried to open it at home, uses Neopsporin    HPI: Brittney Cochran is a 77 y.o. female who presents to the office today to address the problems listed above in the chief complaint.  Dome shaped lesion started on top of left hand about 6 weeks ago; has grown. Thought she could drain it so used a sterilized needle several weeks ago. Now red and sore. Scabbed center. No systemic sxs. No bleeding . Can't get into her dermatologist for 6 months!  Assessment  1. Keratoacanthoma of hand      Plan   keratacanthoma:  Educated. Benign but needs tx. Keflex due to redness. Efudex cream tid and refer to derm in [place] for definitive treatment.   Follow up: prn  03/26/2018  Orders Placed This Encounter  Procedures  . Ambulatory referral to Dermatology   Meds ordered this encounter  Medications  . fluorouracil (EFUDEX) 5 % cream    Sig: Apply topically 3 (three) times daily.    Dispense:  40 g    Refill:  2  . cephALEXin (KEFLEX) 500 MG capsule    Sig: Take 1 capsule (500 mg total) by mouth 2 (two) times daily.    Dispense:  14 capsule    Refill:  0      I reviewed the patients updated PMH, FH, and SocHx.    Patient Active Problem List   Diagnosis Date Noted  . Long term current use of anticoagulant therapy 07/07/2015    Priority: High  . Major depression, recurrent, chronic ([place]) 02/12/2015    Priority: High  . Atherosclerotic heart disease of native coronary artery without angina pectoris 05/16/2012    Priority: High  . Generalized anxiety disorder 01/10/2012    Priority: High  . History of non-ST elevation myocardial infarction (NSTEMI) 12/05/2011    Priority: High  . Mixed hyperlipidemia 12/31/2010    Priority: High  . Paroxysmal atrial fibrillation ([place]) 12/14/2010    Priority: High  . Essential hypertension 01/09/2009    Priority: High  . Subclinical hypothyroidism 03/24/2017      Priority: Medium  . DJD (degenerative joint disease), cervical 05/12/2016    Priority: Medium  . Foraminal stenosis of cervical region 08/18/2015    Priority: Medium  . Chronic pain of both knees 02/12/2015    Priority: Medium  . Zenker diverticulum 05/08/2014    Priority: Medium  . Osteopenia 06/08/2012    Priority: Medium  . Gastro-esophageal reflux disease without esophagitis 08/12/2011    Priority: Medium  . Osteoarthritis, hand 12/31/2010    Priority: Low  . Rosacea 12/31/2010    Priority: Low   Current Meds  Medication Sig  . ALPRAZolam (XANAX) 0.5 MG tablet Take 1 tablet (0.5 mg total) by mouth daily as needed.  Marland Kitchen aspirin EC 81 MG tablet Take 81 mg by mouth.  . butalbital-acetaminophen-caffeine (FIORICET WITH CODEINE) 50-325-40-30 MG capsule Take 1 capsule by mouth every 4 (four) hours as needed for headache.  . carvedilol (COREG) 12.5 MG tablet TK 1 T PO BID WITH MEALS  . Co-Enzyme Q-10 30 MG CAPS Take by mouth.  . diclofenac sodium (VOLTAREN) 1 % GEL Apply 2 g topically 4 (four) times daily. To hands as needed  . docusate sodium (COLACE) 100 MG capsule Take 100 mg by mouth 2 (two) times daily.  . fluticasone (FLONASE) 50 MCG/ACT nasal spray USE  1 SPRAY NASALLY DAILY  . levETIRAcetam (KEPPRA) 1000 MG tablet Take 1,000 mg by mouth 2 (two) times daily.  Marland Kitchen lisinopril (PRINIVIL,ZESTRIL) 2.5 MG tablet Take 2.5 mg by mouth daily.   . metroNIDAZOLE (METROGEL) 1 % gel Apply topically.  . nitroGLYCERIN (NITROSTAT) 0.4 MG SL tablet Place 0.4 mg under the tongue.  Marland Kitchen omeprazole (PRILOSEC) 40 MG capsule Take 40 mg by mouth daily.   . ranolazine (RANEXA) 500 MG 12 hr tablet Take 500 mg by mouth 2 (two) times daily.   . rosuvastatin (CRESTOR) 40 MG tablet Take 40 mg by mouth daily.   . sertraline (ZOLOFT) 100 MG tablet TAKE 1 AND 1/2 TABLETS BY MOUTH DAILY  . SYNTHROID 25 MCG tablet Take 1 tablet (25 mcg total) by mouth daily before breakfast.  . VITAMIN D, CHOLECALCIFEROL, PO Take  by mouth.  . warfarin (COUMADIN) 2 MG tablet TAKE 1 TABLET BY MOUTH DAILY, EXCEPT TAKE 2 TABLETS ON WEDNESDAY, SATURDAY, AND SUNDAY OR AS DIRECTED    Allergies: Patient has No Known Allergies. Family History: Patient family history includes Alcohol abuse in her father; Atrial fibrillation in her mother; Diabetes in her brother, father, and paternal grandmother; Early death in her father and maternal grandfather; Heart attack in her father, maternal uncle, and paternal grandmother; Kidney disease in her maternal grandmother; Stroke in her maternal aunt, maternal grandfather, paternal aunt, and son; Transient ischemic attack in her mother. Social History:  Patient  reports that she has never smoked. She has never used smokeless tobacco. She reports current alcohol use of about 2.0 - 3.0 standard drinks of alcohol per week. She reports that she does not use drugs.  Review of Systems: Constitutional: Negative for fever malaise or anorexia Cardiovascular: negative for chest pain Respiratory: negative for SOB or persistent cough Gastrointestinal: negative for abdominal pain  Objective  Vitals: BP (!) 118/58   Pulse 65   Temp 98.1 F (36.7 C) (Oral)   Resp 16   Ht 5\' 2"  (1.575 m)   Wt 147 lb 9.6 oz (67 kg)   SpO2 96%   BMI 27.00 kg/m  General: no acute distress , A&Ox3 Dorsal left hand with 0.8cm dome shaped lesion with surrounding erythema and central scab/pit, minimally tender w/o fluctuance.    Commons side effects, risks, benefits, and alternatives for medications and treatment plan prescribed today were discussed, and the patient expressed understanding of the given instructions. Patient is instructed to call or message via MyChart if he/she has any questions or concerns regarding our treatment plan. No barriers to understanding were identified. We discussed Red Flag symptoms and signs in detail. Patient expressed understanding regarding what to do in case of urgent or emergency type  symptoms.   Medication list was reconciled, printed and provided to the patient in AVS. Patient instructions and summary information was reviewed with the patient as documented in the AVS. This note was prepared with assistance of Dragon voice recognition software. Occasional wrong-word or sound-a-like substitutions may have occurred due to the inherent limitations of voice recognition software

## 2018-03-01 NOTE — Telephone Encounter (Signed)
Records faxed to Neurosurgeon for referrral

## 2018-03-01 NOTE — Telephone Encounter (Signed)
I believe she signed the release. Please get the records. Thanks.

## 2018-03-02 ENCOUNTER — Other Ambulatory Visit: Payer: Self-pay | Admitting: Family Medicine

## 2018-03-02 ENCOUNTER — Ambulatory Visit: Payer: Medicare Other | Admitting: Physical Therapy

## 2018-03-02 ENCOUNTER — Encounter: Payer: Self-pay | Admitting: Physical Therapy

## 2018-03-02 DIAGNOSIS — M25341 Other instability, right hand: Secondary | ICD-10-CM | POA: Diagnosis not present

## 2018-03-02 DIAGNOSIS — R2689 Other abnormalities of gait and mobility: Secondary | ICD-10-CM

## 2018-03-02 DIAGNOSIS — M6281 Muscle weakness (generalized): Secondary | ICD-10-CM | POA: Diagnosis not present

## 2018-03-02 MED ORDER — SERTRALINE HCL 100 MG PO TABS: 150.0000 mg | ORAL_TABLET | Freq: Every day | ORAL | 0 refills | Status: DC

## 2018-03-02 NOTE — Telephone Encounter (Signed)
Copied from [place] [id number]. Topic: Quick Communication - Rx Refill/Question >> Mar 02, 2018  2:49 PM Carolyn Stare wrote: Medication    sertraline (ZOLOFT) 100 MG tablet   Has the patient contacted their pharmacy yes    Preferred Pharmacy  [facility] [address]    Agent: Please be advised that RX refills may take up to 3 business days. We ask that you follow-up with your pharmacy.

## 2018-03-02 NOTE — Therapy (Signed)
[facility] [address]  [place], [place], [address] Phone: [phone]   Fax:  [phone]  Physical Therapy Treatment  Patient Details  Name: Brittney Cochran MRN: [id number] Date of Birth: 07-Nov-1940 Referring Provider (PT): Billey Chang MD   Encounter Date: 03/02/2018  PT End of Session - 03/02/18 1155    Visit Number  2    Number of Visits  12    Date for PT Re-Evaluation  04/11/18    Authorization Type  UHC MCR    PT Start Time  9628    PT Stop Time  1100    PT Time Calculation (min)  45 min    Activity Tolerance  Patient tolerated treatment well    Behavior During Therapy  [facility] for tasks assessed/performed       Past Medical History:  Diagnosis Date  . Atrial fibrillation ([place])   . Coronary artery disease   . GERD (gastroesophageal reflux disease)    endoscopy and esophageal diatation 2013 Dr. Percell Miller  . Hyperlipemia   . Leiomyoma of body of uterus   . Osteopenia   . Subclinical hypothyroidism 03/24/2017   Started synthroid 25 03/2017 for increasing TSH.    Past Surgical History:  Procedure Laterality Date  . ABLATION OF DYSRHYTHMIC FOCUS    . BLADDER REPAIR  2011  . BREAST BIOPSY Left 08/19/2013  . CAROTID STENT  12/04/2011  . ESOPHAGEAL DILATION  2013  . Floating Kidney  1963  . TUBAL LIGATION  1985    There were no vitals filed for this visit.  Subjective Assessment - 03/02/18 1018    Subjective  Patient reports she has been good since last session; denies falls since last session. Reports she was feeling good after exercising last time.     Patient is accompained by:  Family member   husband   Pertinent History  PMH-subarachnoid hemmorage and subdural hemotoma.Afib,CAD,MI,anx,dep,OA,cervical DDD and stenosis,osteopenia    Currently in Pain?  No/denies                       [facility] Adult PT Treatment/Exercise - 03/02/18 0001      Ambulation/Gait   Ambulation/Gait  Yes     Ambulation/Gait Assistance  5: Supervision;4: Min guard    Ambulation Distance (Feet)  180 Feet    Gait Comments  requiring consistent manual and verbal cueing for SPC sequencing; requiring intermittent min A to maintain balance      Exercises   Exercises  Hand;Knee/Hip;Shoulder      Shoulder Exercises: ROM/Strengthening   UBE (Upper Arm Bike)  L 1 x 3 min forward/ 2 min back with L UE leading      Hand Exercises   PIPJ Flexion  Strengthening;Right;10 reps    PIPJ Flexion Limitations  pinch grip with putty- patient with difficulty with this orientation    Digit Composite ABduction  Strengthening;Right    Digit Composite ABduction Limitations  R hand finger abduction in putty x4 min     Fine Motor Coordination  Digit abduction   R hand finger abduction with palm flat on table x20   Other Hand Exercises  B hand putty pull apart x10 with reforming snake after each rep    Other Hand Exercises  R hand towel roll squeeze 5x10"              PT Education - 03/02/18 1154    Education Details  reviewed HEP  and attached additional notes to hand exercises for improved carryover    Person(s) Educated  Patient    Methods  Explanation;Demonstration;Tactile cues;Verbal cues;Handout    Comprehension  Verbalized understanding;Returned demonstration          PT Long Term Goals - 03/02/18 1200      PT LONG TERM GOAL #1   Title  Pt will be I and compliant with HEP. 6 weeks 04/11/18    Status  On-going      PT LONG TERM GOAL #2   Title  Pt will improve Rt hand grip to at least 4+/5 or 10 lbs. 6 weeks 04/11/18    Status  On-going      PT LONG TERM GOAL #3   Title  Pt will improve BERG >47 and DGI >19 to show reduced risk of falls. 6 weeks 04/11/18    Status  On-going      PT LONG TERM GOAL #4   Title  Pt will improve 5TSTS to 15 sec to show improved balance and endurance. 6 weeks 04/11/18    Status  On-going      PT LONG TERM GOAL #5   Title  Pt to ambulate community distances  without AD and with good stability. 6 weeks 04/11/18    Status  On-going            Plan - 03/02/18 1155    Clinical Impression Statement  Patient arrived to session without AD- reporting no falls since last session. Reviewed hand HEP with patient using putty for intrinsic muscle strengthening. Patient with considerable difficulty and trouble with proper technique with these exercises, requiring consistent manual and verbal cues and demonstration. Attached additional notes to hand exercise HEP for improved patient carryover. Patient reporting that she will be borrowing a cane from a friend. Worked on gait training with SPC in L hand with patient requiring constant manual and verbal cues for proper cane sequencing, however showing good effort and focus throughout. Better performance with consistent verbal cues for "L foot, cane." Advised patient to practice this sequencing at home, however avoid SPC use out in the community for now as she seems to be less steady with SPC at this time. Patient reported understanding.     PT Treatment/Interventions  Moist Heat;Gait training;Stair training;Therapeutic activities;Therapeutic exercise;Balance training;Neuromuscular re-education;Manual techniques;Passive range of motion    PT Next Visit Plan  balance, gait with SPC, Rt hand strength and functional use    Consulted and Agree with Plan of Care  Patient       Patient will benefit from skilled therapeutic intervention in order to improve the following deficits and impairments:  Abnormal gait, Decreased activity tolerance, Decreased endurance, Decreased range of motion, Decreased strength, Decreased balance, Difficulty walking, Impaired UE functional use  Visit Diagnosis: Other abnormalities of gait and mobility  Other instability, right hand     Problem List Patient Active Problem List   Diagnosis Date Noted  . Subclinical hypothyroidism 03/24/2017  . DJD (degenerative joint disease), cervical  05/12/2016  . Foraminal stenosis of cervical region 08/18/2015  . Long term current use of anticoagulant therapy 07/07/2015  . Chronic pain of both knees 02/12/2015  . Major depression, recurrent, chronic ([place]) 02/12/2015  . Zenker diverticulum 05/08/2014  . Osteopenia 06/08/2012  . Atherosclerotic heart disease of native coronary artery without angina pectoris 05/16/2012  . Generalized anxiety disorder 01/10/2012  . History of non-ST elevation myocardial infarction (NSTEMI) 12/05/2011  . Gastro-esophageal reflux disease without esophagitis 08/12/2011  .  Mixed hyperlipidemia 12/31/2010  . Osteoarthritis, hand 12/31/2010  . Rosacea 12/31/2010  . Paroxysmal atrial fibrillation ([place]) 12/14/2010  . Essential hypertension 01/09/2009    Janene Harvey, PT, DPT 03/02/18 12:02 PM   [facility] [address]  [place], [place], [address] Phone: [phone]   Fax:  [phone]  Name: SHERRICE CREEKMORE MRN: [id number] Date of Birth: Jul 18, 1940

## 2018-03-02 NOTE — Telephone Encounter (Signed)
Requested Prescriptions  Pending Prescriptions Disp Refills  . sertraline (ZOLOFT) 100 MG tablet 135 tablet 0    Sig: Take 1.5 tablets (150 mg total) by mouth daily.     Psychiatry:  Antidepressants - SSRI Passed - 03/02/2018  3:01 PM      Passed - Completed PHQ-2 or PHQ-9 in the last 360 days.      Passed - Valid encounter within last 6 months    Recent Outpatient Visits          Yesterday Keratoacanthoma of hand   [facility] [place], MD   1 week ago [facility] (subarachnoid hemorrhage) [facility])   [facility] [place], MD   3 months ago Benign essential hypertension   [facility] [place], MD   9 months ago Chronic pain of right knee   [facility] [place], MD   9 months ago Annual physical exam   [facility], Karie Fetch, MD      Future Appointments            In 3 weeks Leamon Arnt, MD [facility] [place], [place]   In 2 months Leamon Arnt, MD [facility] [place], [place]

## 2018-03-09 ENCOUNTER — Ambulatory Visit: Payer: Medicare Other

## 2018-03-09 DIAGNOSIS — M25341 Other instability, right hand: Secondary | ICD-10-CM

## 2018-03-09 DIAGNOSIS — M6281 Muscle weakness (generalized): Secondary | ICD-10-CM | POA: Diagnosis not present

## 2018-03-09 DIAGNOSIS — R2689 Other abnormalities of gait and mobility: Secondary | ICD-10-CM

## 2018-03-09 NOTE — Therapy (Signed)
[facility] [address]  [place], [place], [address] Phone: [phone]   Fax:  [phone]  Physical Therapy Treatment  Patient Details  Name: Brittney Cochran MRN: [id number] Date of Birth: [birth year] Referring Provider (PT): Billey Chang MD   Encounter Date: 03/09/2018  PT End of Session - 03/09/18 0849    Visit Number  3    Number of Visits  12    Date for PT Re-Evaluation  04/11/18    Authorization Type  UHC MCR    PT Start Time  0846    PT Stop Time  0924    PT Time Calculation (min)  38 min    Activity Tolerance  Patient tolerated treatment well    Behavior During Therapy  [facility] for tasks assessed/performed       Past Medical History:  Diagnosis Date  . Atrial fibrillation ([place])   . Coronary artery disease   . GERD (gastroesophageal reflux disease)    endoscopy and esophageal diatation 2013 Dr. Percell Miller  . Hyperlipemia   . Leiomyoma of body of uterus   . Osteopenia   . Subclinical hypothyroidism 03/24/2017   Started synthroid 25 03/2017 for increasing TSH.    Past Surgical History:  Procedure Laterality Date  . ABLATION OF DYSRHYTHMIC FOCUS    . BLADDER REPAIR  2011  . BREAST BIOPSY Left 08/19/2013  . CAROTID STENT  12/04/2011  . ESOPHAGEAL DILATION  2013  . Floating Kidney  1963  . TUBAL LIGATION  1985    There were no vitals filed for this visit.  Subjective Assessment - 03/09/18 0848    Subjective  Pt. reporting she is having difficulty getting sequencing with cane.      Pertinent History  PMH-subarachnoid hemmorage and subdural hemotoma.Afib,CAD,MI,anx,dep,OA,cervical DDD and stenosis,osteopenia    Patient Stated Goals  get back to normal    Currently in Pain?  No/denies    Multiple Pain Sites  No                       [facility] Adult PT Treatment/Exercise - 03/09/18 0852      Ambulation/Gait   Ambulation/Gait  Yes    Ambulation/Gait Assistance  5: Supervision;4: Min  guard    Ambulation/Gait Assistance Details  Required cueing for proper sequencing as pt. seen to start session carrying SPC hovering over floor    Ambulation Distance (Feet)  180 Feet    Gait Comments  Pt. able to demo proper squencing with SPC in L hand following only min cueing for proper technique; improved safety evident with SPC following cueing       Knee/Hip Exercises: Standing   Heel Raises  15 reps;Both    Heel Raises Limitations  counter     Hip Flexion  Right;Left;10 reps;Knee straight;Stengthening    Hip Flexion Limitations  yellow TB at ankle; counter     Hip Extension  Right;Left;10 reps;Stengthening    Extension Limitations  yellow TB at ankles; counter     Other Standing Knee Exercises  Alternating toe-clears to 8" step x 10 reps     Other Standing Knee Exercises  Side stepping with yellow TB at ankles at counter 3 laps down/back at counter       Knee/Hip Exercises: Seated   Sit to Sand  10 reps;with UE support   1 UE pushoff      Knee/Hip Exercises: Supine   Bridges  Both;10 reps  Other Supine Knee/Hip Exercises  Hooklying alternating clam shell with red TB at knees x 10 reps       Shoulder Exercises: ROM/Strengthening   UBE (Upper Arm Bike)  L 1 x 3 min forward/ 2 min back with L UE leading      Hand Exercises   Other Hand Exercises  R [place]all grip squeeze yellow spring (10#) - x 15 rpes - difficulty      Wrist Exercises   Wrist Flexion  Right;10 reps    Bar Weights/Barbell (Wrist Flexion)  1 lb    Wrist Extension  Right;10 reps    Bar Weights/Barbell (Wrist Extension)  1 lb                  PT Long Term Goals - 03/02/18 1200      PT LONG TERM GOAL #1   Title  Pt will be I and compliant with HEP. 6 weeks 04/11/18    Status  On-going      PT LONG TERM GOAL #2   Title  Pt will improve Rt hand grip to at least 4+/5 or 10 lbs. 6 weeks 04/11/18    Status  On-going      PT LONG TERM GOAL #3   Title  Pt will improve BERG >47 and DGI >19 to show  reduced risk of falls. 6 weeks 04/11/18    Status  On-going      PT LONG TERM GOAL #4   Title  Pt will improve 5TSTS to 15 sec to show improved balance and endurance. 6 weeks 04/11/18    Status  On-going      PT LONG TERM GOAL #5   Title  Pt to ambulate community distances without AD and with good stability. 6 weeks 04/11/18    Status  On-going            Plan - 03/09/18 0850    Clinical Impression Statement  Lorel seen entering treatment today carrying SPC hovering it over floor and with lateral sway while ambulating x 2.  Encouraged pt. to use [facility] for ambulating and required min cueing for proper sequence.  Pt. demonstrating improved stability and safety following gait training.  Pt. tolerated all balance and LE strengthening activities in session well today.  Did report R hand fatigue following gripping exercise today which subsided.  Will continue to progress toward goals.      Rehab Potential  Good    PT Frequency  2x / week    PT Duration  6 weeks    PT Treatment/Interventions  Moist Heat;Gait training;Stair training;Therapeutic activities;Therapeutic exercise;Balance training;Neuromuscular re-education;Manual techniques;Passive range of motion    PT Next Visit Plan  balance, review of gait sequence with SPC prn, Rt hand strength and functional use    Consulted and Agree with Plan of Care  Patient       Patient will benefit from skilled therapeutic intervention in order to improve the following deficits and impairments:  Abnormal gait, Decreased activity tolerance, Decreased endurance, Decreased range of motion, Decreased strength, Decreased balance, Difficulty walking, Impaired UE functional use  Visit Diagnosis: Other abnormalities of gait and mobility  Other instability, right hand     Problem List Patient Active Problem List   Diagnosis Date Noted  . Subclinical hypothyroidism 03/24/2017  . DJD (degenerative joint disease), cervical 05/12/2016  . Foraminal  stenosis of cervical region 08/18/2015  . Long term current use of anticoagulant therapy 07/07/2015  . Chronic pain of both  knees 02/12/2015  . Major depression, recurrent, chronic ([place]) 02/12/2015  . Zenker diverticulum 05/08/2014  . Osteopenia 06/08/2012  . Atherosclerotic heart disease of native coronary artery without angina pectoris 05/16/2012  . Generalized anxiety disorder 01/10/2012  . History of non-ST elevation myocardial infarction (NSTEMI) 12/05/2011  . Gastro-esophageal reflux disease without esophagitis 08/12/2011  . Mixed hyperlipidemia 12/31/2010  . Osteoarthritis, hand 12/31/2010  . Rosacea 12/31/2010  . Paroxysmal atrial fibrillation ([place]) 12/14/2010  . Essential hypertension 01/09/2009    Bess Harvest, PTA 03/09/18 10:47 AM   [facility] [address]  [place], [place], [address] Phone: [phone]   Fax:  [phone]  Name: Brittney Cochran MRN: [id number] Date of Birth: 12/04/1940

## 2018-03-12 ENCOUNTER — Ambulatory Visit: Payer: Medicare Other | Admitting: Occupational Therapy

## 2018-03-12 ENCOUNTER — Encounter: Payer: Self-pay | Admitting: Occupational Therapy

## 2018-03-12 DIAGNOSIS — M6281 Muscle weakness (generalized): Secondary | ICD-10-CM

## 2018-03-12 DIAGNOSIS — R2689 Other abnormalities of gait and mobility: Secondary | ICD-10-CM | POA: Diagnosis not present

## 2018-03-12 DIAGNOSIS — M25341 Other instability, right hand: Secondary | ICD-10-CM | POA: Diagnosis not present

## 2018-03-12 NOTE — Therapy (Signed)
[facility] [address] [place], [place], [address] Phone: [phone]   Fax:  [phone]  Occupational Therapy Evaluation  Patient Details  Name: Brittney Cochran MRN: [id number] Date of Birth: May 05, 1940 No data recorded  Encounter Date: 03/12/2018  OT End of Session - 03/12/18 1622    Visit Number  1    Number of Visits  1    Date for OT Re-Evaluation  --   n/a   Authorization Type  medicare    OT Start Time  1533    OT Stop Time  1610    OT Time Calculation (min)  37 min    Activity Tolerance  Patient tolerated treatment well       Past Medical History:  Diagnosis Date  . Atrial fibrillation ([place])   . Coronary artery disease   . GERD (gastroesophageal reflux disease)    endoscopy and esophageal diatation 2013 Dr. Percell Miller  . Hyperlipemia   . Leiomyoma of body of uterus   . Osteopenia   . Subclinical hypothyroidism 03/24/2017   Started synthroid 25 03/2017 for increasing TSH.    Past Surgical History:  Procedure Laterality Date  . ABLATION OF DYSRHYTHMIC FOCUS    . BLADDER REPAIR  2011  . BREAST BIOPSY Left 08/19/2013  . CAROTID STENT  12/04/2011  . ESOPHAGEAL DILATION  2013  . Floating Kidney  1963  . TUBAL LIGATION  1985    There were no vitals filed for this visit.  Subjective Assessment - 03/12/18 1544    Subjective   I haven't had any headaches for the past few days.      Pertinent History  Pt s/p SAH, SDH due to fall on 02/12/2018 discharged home on 02/21/2018.      Patient Stated Goals  to get my balance back and get my hand stronger    Currently in Pain?  No/denies        [facility] OT Assessment - 03/12/18 0001      Assessment   Medical Diagnosis  SDH/SAH due to a fall    Onset Date/Surgical Date  02/12/18   date of fall   Hand Dominance  Right    Prior Therapy  acute PT, currently in PT at [facility]      Precautions   Precautions  Fall      Restrictions   Weight  Bearing Restrictions  No      Balance Screen   Has the patient fallen in the past 6 months  Yes   pt seeing PT now     [name] expects to be discharged to:  Private residence    Living Arrangements  Spouse/significant other    Type of [place]  One level    Bathroom Shower/Tub  Walk-in Shower    Bathroom Toilet  Handicapped height    Additional Comments  pt has no equipment in the bathroom.  Pt states they were planning on putting in grab bars prior to this fall.       Prior Function   Level of Independence  Independent   needed help prior to open jars   Vocation  Retired    Leisure  walking      ADL   Eating/Feeding  Modified independent    Grooming  Independent    Forensic scientist  Independent  Upper Body Dressing  Minimal assistance   to hook bra   Lower Body Dressing  Independent    Toilet Transfer  Modified independent    Toileting - Clothing Manipulation  Modified independent    Toileting -  Hygiene  Independent    Tub/Shower Transfer  Modified independent      IADL   Shopping  Completely unable to shop   pt states she has alot of anxiety   Light Housekeeping  Does not participate in any housekeeping tasks    Meal Prep  Able to complete simple cold meal and snack prep   pt states they go out alot but she hates to [employer] on family or friends for transportation    Medication Management  Is responsible for taking medication in correct dosages at correct time   occassionally needs assist to open meds     Mobility   Mobility Status  History of falls    Mobility Status Comments  needs assistance in the community      Written Expression   Dominant Hand  Right      Vision - History   Baseline Vision  Wears glasses all the time    Additional Comments  Pt states glasses needed to be updated before she fell but far distance is worse now -       Cognition    Overall Cognitive Status  Impaired/Different from baseline    [place] Exam   Pt states that initially she had word finding and difficulty paying attention however she feels this is resolving.  Pt tends to be tangential in conversation however this may have been present before.       Sensation   Light Touch  Appears Intact    Hot/Cold  Appears Intact    Proprioception  Appears Intact      Coordination   Gross Motor Movements are Fluid and Coordinated  Yes    Fine Motor Movements are Fluid and Coordinated  Yes    Finger Nose Finger Test  WFL's       Tone   Assessment Location  Right Upper Extremity      ROM / Strength   AROM / PROM / Strength  AROM;Strength      AROM   Overall AROM   Within functional limits for tasks performed      Strength   Overall Strength  Deficits    Overall Strength Comments  decreased grip strength all other WFL's      Hand Function   Right Hand Gross Grasp  Functional    Right Hand Grip (lbs)  33    Left Hand Gross Grasp  Impaired    Left Hand Grip (lbs)  7      RUE Tone   RUE Tone  Within Functional Limits                           OT Long Term Goals - 03/12/18 1612      OT LONG TERM GOAL #1   Title  n/a            Plan - 03/12/18 1612    Clinical Impression Statement  Pt is a 77 year old female s/p SDH/SAH due to recent fall on 02/12/2018.  Pt discharged home on 02/21/2018.  Pt presents today with R hand weakness and decreased balance however is currently being seen at [facility] by PT and they are addressing both. Pt did not have any other RUE deficits and feels language and cognition are back to baseline. Pt will not need any follow up OT at this time. Pt in agreement.     Occupational Profile and client history currently impacting functional performance  Wife, friend, mother.  PMH:  HTN, GERD, depression, anxiety, CAD with MI, arthritis. Pt was on blood thinner prior to fall    Occupational  performance deficits (Please refer to evaluation for details):  IADL's;Leisure    Rehab Potential  --   n/a   OT Frequency  --   n/a   Plan  no further folllow up for OT - deficits currently being addressed by PT    Consulted and Agree with Plan of Care  Patient       Patient will benefit from skilled therapeutic intervention in order to improve the following deficits and impairments:  Decreased strength, Impaired UE functional use, Decreased balance, Decreased mobility  Visit Diagnosis: Muscle weakness (generalized)    Problem List Patient Active Problem List   Diagnosis Date Noted  . Subclinical hypothyroidism 03/24/2017  . DJD (degenerative joint disease), cervical 05/12/2016  . Foraminal stenosis of cervical region 08/18/2015  . Long term current use of anticoagulant therapy 07/07/2015  . Chronic pain of both knees 02/12/2015  . Major depression, recurrent, chronic ([place]) 02/12/2015  . Zenker diverticulum 05/08/2014  . Osteopenia 06/08/2012  . Atherosclerotic heart disease of native coronary artery without angina pectoris 05/16/2012  . Generalized anxiety disorder 01/10/2012  . History of non-ST elevation myocardial infarction (NSTEMI) 12/05/2011  . Gastro-esophageal reflux disease without esophagitis 08/12/2011  . Mixed hyperlipidemia 12/31/2010  . Osteoarthritis, hand 12/31/2010  . Rosacea 12/31/2010  . Paroxysmal atrial fibrillation ([place]) 12/14/2010  . Essential hypertension 01/09/2009    Quay Burow, OTR/L 03/12/2018, 4:23 PM  [facility] [address] [place], [place], [address] Phone: [phone]   Fax:  [phone]  Name: JAYLYNN MCALEER MRN: [id number] Date of Birth: 1940-12-08

## 2018-03-13 ENCOUNTER — Ambulatory Visit: Payer: Medicare Other

## 2018-03-13 DIAGNOSIS — R2689 Other abnormalities of gait and mobility: Secondary | ICD-10-CM | POA: Diagnosis not present

## 2018-03-13 DIAGNOSIS — M6281 Muscle weakness (generalized): Secondary | ICD-10-CM

## 2018-03-13 DIAGNOSIS — M25341 Other instability, right hand: Secondary | ICD-10-CM | POA: Diagnosis not present

## 2018-03-13 NOTE — Therapy (Signed)
[facility] [address]  [place], [place], [address] Phone: [phone]   Fax:  [phone]  Physical Therapy Treatment  Patient Details  Name: Brittney Cochran MRN: [id number] Date of Birth: August 28, 1940 Referring Provider (PT): Billey Chang MD   Encounter Date: 03/13/2018  PT End of Session - 03/13/18 1323    Visit Number  4    Number of Visits  12    Date for PT Re-Evaluation  04/11/18    Authorization Type  UHC MCR    PT Start Time  4196    PT Stop Time  1359    PT Time Calculation (min)  44 min    Activity Tolerance  Patient tolerated treatment well    Behavior During Therapy  [facility] for tasks assessed/performed       Past Medical History:  Diagnosis Date  . Atrial fibrillation ([place])   . Coronary artery disease   . GERD (gastroesophageal reflux disease)    endoscopy and esophageal diatation 2013 Dr. Percell Miller  . Hyperlipemia   . Leiomyoma of body of uterus   . Osteopenia   . Subclinical hypothyroidism 03/24/2017   Started synthroid 25 03/2017 for increasing TSH.    Past Surgical History:  Procedure Laterality Date  . ABLATION OF DYSRHYTHMIC FOCUS    . BLADDER REPAIR  2011  . BREAST BIOPSY Left 08/19/2013  . CAROTID STENT  12/04/2011  . ESOPHAGEAL DILATION  2013  . Floating Kidney  1963  . TUBAL LIGATION  1985    There were no vitals filed for this visit.  Subjective Assessment - 03/13/18 1318    Subjective  Pt. doing well.  Had mild soreness in muscles x 1 day after last visit.      Patient is accompained by:  Family member   husband    Pertinent History  PMH-subarachnoid hemmorage and subdural hemotoma.Afib,CAD,MI,anx,dep,OA,cervical DDD and stenosis,osteopenia    Patient Stated Goals  get back to normal    Currently in Pain?  No/denies    Multiple Pain Sites  No                       [facility] Adult PT Treatment/Exercise - 03/13/18 1335      High Level Balance   High Level  Balance Activities  Tandem walking;Turns    High Level Balance Comments  tandem walk forward/backwards at counter x 2 laps, toe walking (light UE support on counter) x 1 laps at counter, backwards walking (no support) x 2 laps (supervision)       Knee/Hip Exercises: Aerobic   Nustep  Lvl 3, 6 min (UE/LE)      Knee/Hip Exercises: Standing   Heel Raises  15 reps;Both    Heel Raises Limitations  counter       Hand Exercises   Digiticizer  R digitizer fingers 5-2 x 5 rounds; then full hand squeeze x 10 reps       Wrist Exercises   Wrist Flexion  Right   x 12 reps    Bar Weights/Barbell (Wrist Flexion)  1 lb    Wrist Flexion Limitations  forearm resting on table     Wrist Extension  Right   x 12 rpes    Bar Weights/Barbell (Wrist Extension)  1 lb    Wrist Extension Limitations  forearm resting on table     Wrist Radial Deviation  Right;10 reps    Theraband Level (Radial Deviation)  Level 2 (Red)    Wrist Radial Deviation Limitations  forearm resting on table     Wrist Ulnar Deviation  Right;10 reps    Theraband Level (Ulnar Deviation)  Level 2 (Red)    Wrist Ulnar Deviation Limitations  forearm resting on table     Other wrist exercises  .                  PT Long Term Goals - 03/02/18 1200      PT LONG TERM GOAL #1   Title  Pt will be I and compliant with HEP. 6 weeks 04/11/18    Status  On-going      PT LONG TERM GOAL #2   Title  Pt will improve Rt hand grip to at least 4+/5 or 10 lbs. 6 weeks 04/11/18    Status  On-going      PT LONG TERM GOAL #3   Title  Pt will improve BERG >47 and DGI >19 to show reduced risk of falls. 6 weeks 04/11/18    Status  On-going      PT LONG TERM GOAL #4   Title  Pt will improve 5TSTS to 15 sec to show improved balance and endurance. 6 weeks 04/11/18    Status  On-going      PT LONG TERM GOAL #5   Title  Pt to ambulate community distances without AD and with good stability. 6 weeks 04/11/18    Status  On-going             Plan - 03/13/18 1323    Clinical Impression Statement  Pt. reporting mild muscular soreness after last visit which subsided next day.  Tolerated mild advancement of R hand/forearm/wrist strengthening activities in session well.  Tolerated all standing high-level balance activities with pain however most difficult with backwards tandem walking requiring CGA/supervision from therapist.  Pt. able to demo proper sequencing with [facility] today and reports better compliance with SPC since last visit.  Will continue to progress toward goals.      Rehab Potential  Good    PT Frequency  2x / week    PT Duration  6 weeks    PT Treatment/Interventions  Moist Heat;Gait training;Stair training;Therapeutic activities;Therapeutic exercise;Balance training;Neuromuscular re-education;Manual techniques;Passive range of motion    PT Next Visit Plan  balance, review of gait sequence with SPC prn, Rt hand strength and functional use    Consulted and Agree with Plan of Care  Patient       Patient will benefit from skilled therapeutic intervention in order to improve the following deficits and impairments:  Abnormal gait, Decreased activity tolerance, Decreased endurance, Decreased range of motion, Decreased strength, Decreased balance, Difficulty walking, Impaired UE functional use  Visit Diagnosis: Muscle weakness (generalized)  Other abnormalities of gait and mobility  Other instability, right hand     Problem List Patient Active Problem List   Diagnosis Date Noted  . Subclinical hypothyroidism 03/24/2017  . DJD (degenerative joint disease), cervical 05/12/2016  . Foraminal stenosis of cervical region 08/18/2015  . Long term current use of anticoagulant therapy 07/07/2015  . Chronic pain of both knees 02/12/2015  . Major depression, recurrent, chronic ([place]) 02/12/2015  . Zenker diverticulum 05/08/2014  . Osteopenia 06/08/2012  . Atherosclerotic heart disease of native coronary artery without  angina pectoris 05/16/2012  . Generalized anxiety disorder 01/10/2012  . History of non-ST elevation myocardial infarction (NSTEMI) 12/05/2011  . Gastro-esophageal reflux disease without esophagitis 08/12/2011  . Mixed  hyperlipidemia 12/31/2010  . Osteoarthritis, hand 12/31/2010  . Rosacea 12/31/2010  . Paroxysmal atrial fibrillation ([place]) 12/14/2010  . Essential hypertension 01/09/2009    Bess Harvest, PTA 03/13/18 4:15 PM   [facility] [address]  [place], [place], [address] Phone: [phone]   Fax:  [phone]  Name: KALIJAH WESTFALL MRN: [id number] Date of Birth: [birth year]

## 2018-03-16 ENCOUNTER — Ambulatory Visit: Payer: Medicare Other | Attending: Family Medicine | Admitting: Physical Therapy

## 2018-03-16 ENCOUNTER — Encounter: Payer: Self-pay | Admitting: Physical Therapy

## 2018-03-16 DIAGNOSIS — R2689 Other abnormalities of gait and mobility: Secondary | ICD-10-CM | POA: Insufficient documentation

## 2018-03-16 DIAGNOSIS — M25341 Other instability, right hand: Secondary | ICD-10-CM | POA: Diagnosis not present

## 2018-03-16 DIAGNOSIS — M6281 Muscle weakness (generalized): Secondary | ICD-10-CM | POA: Insufficient documentation

## 2018-03-16 NOTE — Therapy (Signed)
[facility] [address]  [place], [place], [address] Phone: [phone]   Fax:  [phone]  Physical Therapy Treatment  Patient Details  Name: Brittney Cochran MRN: [id number] Date of Birth: 1940-03-20 Referring Provider (PT): Billey Chang MD   Encounter Date: 03/16/2018  PT End of Session - 03/16/18 1150    Visit Number  5    Number of Visits  12    Date for PT Re-Evaluation  04/11/18    Authorization Type  UHC MCR    PT Start Time  1058    PT Stop Time  1146    PT Time Calculation (min)  48 min    Activity Tolerance  Patient tolerated treatment well    Behavior During Therapy  [facility] for tasks assessed/performed       Past Medical History:  Diagnosis Date  . Atrial fibrillation ([place])   . Coronary artery disease   . GERD (gastroesophageal reflux disease)    endoscopy and esophageal diatation 2013 Dr. Percell Miller  . Hyperlipemia   . Leiomyoma of body of uterus   . Osteopenia   . Subclinical hypothyroidism 03/24/2017   Started synthroid 25 03/2017 for increasing TSH.    Past Surgical History:  Procedure Laterality Date  . ABLATION OF DYSRHYTHMIC FOCUS    . BLADDER REPAIR  2011  . BREAST BIOPSY Left 08/19/2013  . CAROTID STENT  12/04/2011  . ESOPHAGEAL DILATION  2013  . Floating Kidney  1963  . TUBAL LIGATION  1985    There were no vitals filed for this visit.  Subjective Assessment - 03/16/18 1100    Subjective  Reports she has been feeling good- just a little tired after last session. Notes that she hasn't been doing HEP d/t lots of family over for the holidays.     Patient is accompained by:  Family member   husband   Pertinent History  PMH-subarachnoid hemmorage and subdural hemotoma.Afib,CAD,MI,anx,dep,OA,cervical DDD and stenosis,osteopenia    Patient Stated Goals  get back to normal    Currently in Pain?  No/denies                       [facility] Adult PT Treatment/Exercise - 03/16/18  0001      Ambulation/Gait   Ambulation/Gait  Yes    Ambulation/Gait Assistance  6: Modified independent (Device/Increase time)    Ambulation Distance (Feet)  90 Feet    Gait Comments  excellent sequencing with SPC and good heel strike on B LEs      Knee/Hip Exercises: Standing   SLS  alternate toe tap on step 3x20"   good control   Other Standing Knee Exercises  backwards walking along counter top 4x lenght of couter with cues to speed up for added challenge    Other Standing Knee Exercises  Side stepping with yellow TB at ankles at counter 4x length of counter top with hands hovering      Shoulder Exercises: ROM/Strengthening   UBE (Upper Arm Bike)  L 1.5 x 3 min forward/ 2 min back with L UE leading      Hand Exercises   Digit Composite ABduction  Strengthening;Right    Digit Composite ABduction Limitations  R finger abduction on table x20; with yellow putty x10   cues to increased ROM of medial fingers   Thumb Opposition  R hand marble pick up with pinch grip with each finger x10 cycles   c/o  R shoulder muscle burn   Digiticizer  R digitizer fingers 5-5x each finger; then full hand squeeze x 10 reps     Other Hand Exercises  B hand putty pull apart x10 with reforming snake after each rep   better activation of R hand compared to last visit     Wrist Exercises   Wrist Flexion  Strengthening;Right;10 reps    Bar Weights/Barbell (Wrist Flexion)  1 lb    Wrist Flexion Limitations  forearm resting on table    Wrist Extension  Right;10 reps;Strengthening    Bar Weights/Barbell (Wrist Extension)  1 lb    Wrist Extension Limitations  forearm resting on table    Other wrist exercises  R elbow              PT Education - 03/16/18 1149    Education Details  update to HEP    Person(s) Educated  Patient    Methods  Explanation;Demonstration;Tactile cues;Verbal cues;Handout    Comprehension  Verbalized understanding;Returned demonstration          PT Long Term Goals -  03/02/18 1200      PT LONG TERM GOAL #1   Title  Pt will be I and compliant with HEP. 6 weeks 04/11/18    Status  On-going      PT LONG TERM GOAL #2   Title  Pt will improve Rt hand grip to at least 4+/5 or 10 lbs. 6 weeks 04/11/18    Status  On-going      PT LONG TERM GOAL #3   Title  Pt will improve BERG >47 and DGI >19 to show reduced risk of falls. 6 weeks 04/11/18    Status  On-going      PT LONG TERM GOAL #4   Title  Pt will improve 5TSTS to 15 sec to show improved balance and endurance. 6 weeks 04/11/18    Status  On-going      PT LONG TERM GOAL #5   Title  Pt to ambulate community distances without AD and with good stability. 6 weeks 04/11/18    Status  On-going            Plan - 03/16/18 1150    Clinical Impression Statement  Patient arrived to session with no new complaints. Notes that she hasn't been performing HEP as consistently d/t holidays and notes that she recognizes benefit from using the [facility]. Patient now ambulating with excellent sequencing with SPC and demonstrating good heel strike throughout. Worked on dynamic balance challenges with patient showing good stability and ability to correct imbalance. Worked on R hand strengthening with review of previous HEP. Patient still struggling with R hand finger abduction, with most ROM limited in 1st and 2nd digits. Progressed HEP with marble pick up as well as light weighted wrist flexion/extension. Patient reported no complaints at end of session.     PT Treatment/Interventions  Moist Heat;Gait training;Stair training;Therapeutic activities;Therapeutic exercise;Balance training;Neuromuscular re-education;Manual techniques;Passive range of motion    PT Next Visit Plan  progress R hand and wrist strength    Consulted and Agree with Plan of Care  Patient       Patient will benefit from skilled therapeutic intervention in order to improve the following deficits and impairments:  Abnormal gait, Decreased activity tolerance,  Decreased endurance, Decreased range of motion, Decreased strength, Decreased balance, Difficulty walking, Impaired UE functional use  Visit Diagnosis: Muscle weakness (generalized)  Other abnormalities of gait and mobility  Other instability, right  hand     Problem List Patient Active Problem List   Diagnosis Date Noted  . Subclinical hypothyroidism 03/24/2017  . DJD (degenerative joint disease), cervical 05/12/2016  . Foraminal stenosis of cervical region 08/18/2015  . Long term current use of anticoagulant therapy 07/07/2015  . Chronic pain of both knees 02/12/2015  . Major depression, recurrent, chronic ([place]) 02/12/2015  . Zenker diverticulum 05/08/2014  . Osteopenia 06/08/2012  . Atherosclerotic heart disease of native coronary artery without angina pectoris 05/16/2012  . Generalized anxiety disorder 01/10/2012  . History of non-ST elevation myocardial infarction (NSTEMI) 12/05/2011  . Gastro-esophageal reflux disease without esophagitis 08/12/2011  . Mixed hyperlipidemia 12/31/2010  . Osteoarthritis, hand 12/31/2010  . Rosacea 12/31/2010  . Paroxysmal atrial fibrillation ([place]) 12/14/2010  . Essential hypertension 01/09/2009    Janene Harvey, PT, DPT 03/16/18 11:57 AM   [facility] [address]  [place], [place], [address] Phone: [phone]   Fax:  [phone]  Name: Brittney Cochran MRN: [id number] Date of Birth: 1940/10/06

## 2018-03-20 ENCOUNTER — Ambulatory Visit: Payer: Medicare Other | Admitting: Physical Therapy

## 2018-03-20 ENCOUNTER — Encounter: Payer: Self-pay | Admitting: Physical Therapy

## 2018-03-20 DIAGNOSIS — M25341 Other instability, right hand: Secondary | ICD-10-CM | POA: Diagnosis not present

## 2018-03-20 DIAGNOSIS — R2689 Other abnormalities of gait and mobility: Secondary | ICD-10-CM | POA: Diagnosis not present

## 2018-03-20 DIAGNOSIS — M6281 Muscle weakness (generalized): Secondary | ICD-10-CM | POA: Diagnosis not present

## 2018-03-20 DIAGNOSIS — I614 Nontraumatic intracerebral hemorrhage in cerebellum: Secondary | ICD-10-CM | POA: Diagnosis not present

## 2018-03-20 NOTE — Therapy (Signed)
[facility] [address]  [place], [place], [address] Phone: [phone]   Fax:  [phone]  Physical Therapy Treatment  Patient Details  Name: Brittney Cochran MRN: [id number] Date of Birth: [birth year] Referring Provider (PT): Billey Chang MD   Encounter Date: 03/20/2018  PT End of Session - 03/20/18 1614    Visit Number  6    Number of Visits  12    Date for PT Re-Evaluation  04/11/18    PT Start Time  9371    PT Stop Time  1615    PT Time Calculation (min)  42 min    Activity Tolerance  Patient tolerated treatment well    Behavior During Therapy  [facility] for tasks assessed/performed       Past Medical History:  Diagnosis Date  . Atrial fibrillation ([place])   . Coronary artery disease   . GERD (gastroesophageal reflux disease)    endoscopy and esophageal diatation 2013 Dr. Percell Miller  . Hyperlipemia   . Leiomyoma of body of uterus   . Osteopenia   . Subclinical hypothyroidism 03/24/2017   Started synthroid 25 03/2017 for increasing TSH.    Past Surgical History:  Procedure Laterality Date  . ABLATION OF DYSRHYTHMIC FOCUS    . BLADDER REPAIR  2011  . BREAST BIOPSY Left 08/19/2013  . CAROTID STENT  12/04/2011  . ESOPHAGEAL DILATION  2013  . Floating Kidney  1963  . TUBAL LIGATION  1985    There were no vitals filed for this visit.  Subjective Assessment - 03/20/18 1535    Subjective  Reports she has been well and has been practicing her HEP. No issues with balance lately. Saw a neurosurgeon who ordered an MRI of her head. Is worried about her test results.     Patient is accompained by:  Family member   husband   Pertinent History  PMH-subarachnoid hemmorage and subdural hemotoma.Afib,CAD,MI,anx,dep,OA,cervical DDD and stenosis,osteopenia    Patient Stated Goals  get back to normal    Currently in Pain?  No/denies                       [facility] Adult PT Treatment/Exercise - 03/20/18 0001       Shoulder Exercises: ROM/Strengthening   UBE (Upper Arm Bike)  L 1.7 x 3 min forward/ 3 min back with L UE leading      Hand Exercises   MCPJ Extension Limitations  R hand all finger extension with white rubber band x10    Digit Composite ABduction  Strengthening;Right    Digit Composite ABduction Limitations  R finger abduction AROM on table; R finger abduction with blue rubber band x10    Purdue Pegboard  R hand pedboard x20    Digiticizer  R digitizer 5lbs- full hand squeeze x 5 reps ; R hand claw squeeze with putty 5x    In hand manipulation training   R hand putty squeeze x3" with molding into snake shape after each rep x5    Other Hand Exercises  B hand putty pull apart x10 with reforming snake after each rep    Other Hand Exercises  R tip pinch and key pinch with putty 10x   yellow putty; red putty with last 5 reps of key pinch            PT Education - 03/20/18 1614    Education Details  update to HEP    Person(s)  Educated  Patient    Methods  Explanation;Demonstration;Tactile cues;Verbal cues;Handout    Comprehension  Verbalized understanding;Returned demonstration          PT Long Term Goals - 03/02/18 1200      PT LONG TERM GOAL #1   Title  Pt will be I and compliant with HEP. 6 weeks 04/11/18    Status  On-going      PT LONG TERM GOAL #2   Title  Pt will improve Rt hand grip to at least 4+/5 or 10 lbs. 6 weeks 04/11/18    Status  On-going      PT LONG TERM GOAL #3   Title  Pt will improve BERG >47 and DGI >19 to show reduced risk of falls. 6 weeks 04/11/18    Status  On-going      PT LONG TERM GOAL #4   Title  Pt will improve 5TSTS to 15 sec to show improved balance and endurance. 6 weeks 04/11/18    Status  On-going      PT LONG TERM GOAL #5   Title  Pt to ambulate community distances without AD and with good stability. 6 weeks 04/11/18    Status  On-going            Plan - 03/20/18 1615    Clinical Impression Statement  Patient arrived to  session noting compliance with HEP. Notes that she saw her neurosurgeon who is ordering an MRI of the head to figure out origin of her subarachnoid hemorrhage. Worked on R grip strength ther-ex with intermittent cues to correct form. Patient with increased ROM and strength on ulnar side of R hand, cues required to increase muscle activation of medial 1st and 2nd digits. Tip pinch with putty somewhat limited d/t limited DIP joint ROM secondary to hx of arthritis. Patient showing good effort to respond towards cues throughout treatment. Updated HEP to include rubber band exercises. Patient reported understanding. No complaints at end of session.     PT Treatment/Interventions  Moist Heat;Gait training;Stair training;Therapeutic activities;Therapeutic exercise;Balance training;Neuromuscular re-education;Manual techniques;Passive range of motion    PT Next Visit Plan  progress R hand and wrist strength    Consulted and Agree with Plan of Care  Patient       Patient will benefit from skilled therapeutic intervention in order to improve the following deficits and impairments:  Abnormal gait, Decreased activity tolerance, Decreased endurance, Decreased range of motion, Decreased strength, Decreased balance, Difficulty walking, Impaired UE functional use  Visit Diagnosis: Muscle weakness (generalized)  Other abnormalities of gait and mobility  Other instability, right hand     Problem List Patient Active Problem List   Diagnosis Date Noted  . Subclinical hypothyroidism 03/24/2017  . DJD (degenerative joint disease), cervical 05/12/2016  . Foraminal stenosis of cervical region 08/18/2015  . Long term current use of anticoagulant therapy 07/07/2015  . Chronic pain of both knees 02/12/2015  . Major depression, recurrent, chronic ([place]) 02/12/2015  . Zenker diverticulum 05/08/2014  . Osteopenia 06/08/2012  . Atherosclerotic heart disease of native coronary artery without angina pectoris 05/16/2012   . Generalized anxiety disorder 01/10/2012  . History of non-ST elevation myocardial infarction (NSTEMI) 12/05/2011  . Gastro-esophageal reflux disease without esophagitis 08/12/2011  . Mixed hyperlipidemia 12/31/2010  . Osteoarthritis, hand 12/31/2010  . Rosacea 12/31/2010  . Paroxysmal atrial fibrillation ([place]) 12/14/2010  . Essential hypertension 01/09/2009    Janene Harvey, PT, DPT 03/20/18 4:20 PM   [facility] [address]  [place], [place], [address] Phone: [phone]   Fax:  [phone]  Name: Brittney Cochran MRN: [id number] Date of Birth: 1940-10-21

## 2018-03-23 ENCOUNTER — Ambulatory Visit: Payer: Medicare Other

## 2018-03-26 ENCOUNTER — Other Ambulatory Visit: Payer: Self-pay

## 2018-03-26 ENCOUNTER — Ambulatory Visit (INDEPENDENT_AMBULATORY_CARE_PROVIDER_SITE_OTHER): Payer: Medicare Other | Admitting: Family Medicine

## 2018-03-26 ENCOUNTER — Encounter: Payer: Self-pay | Admitting: Family Medicine

## 2018-03-26 VITALS — BP 104/60 | HR 63 | Temp 97.6°F | Resp 16 | Ht 62.0 in | Wt 148.0 lb

## 2018-03-26 DIAGNOSIS — R29898 Other symptoms and signs involving the musculoskeletal system: Secondary | ICD-10-CM

## 2018-03-26 DIAGNOSIS — I480 Paroxysmal atrial fibrillation: Secondary | ICD-10-CM

## 2018-03-26 DIAGNOSIS — I609 Nontraumatic subarachnoid hemorrhage, unspecified: Secondary | ICD-10-CM

## 2018-03-26 DIAGNOSIS — S065X9A Traumatic subdural hemorrhage with loss of consciousness of unspecified duration, initial encounter: Secondary | ICD-10-CM

## 2018-03-26 DIAGNOSIS — S065XAA Traumatic subdural hemorrhage with loss of consciousness status unknown, initial encounter: Secondary | ICD-10-CM

## 2018-03-26 NOTE — Progress Notes (Signed)
Subjective  CC:  Chief Complaint  Patient presents with  . Hospitalization Follow-up    [facility] in [place], [place].Marland Kitchen 02/14/18 - 02/21/18 for a fall and brain bleed    HPI: Brittney Cochran is a 78 y.o. female who presents to the office today to address the problems listed above in the chief complaint.  78 year old status post subarachnoid hemorrhage last month here for follow-up.  She had residual right upper extremity weakness and unsteady gait at last visit.  Since, she has been to physical therapy and is much improved.  She is also followed up with her cardiologist and we referred her to neurosurgery.  She continues to remain off anticoagulation.  She is on aspirin.  She has a history of A. fib status post cardioversion, she remains in sinus rhythm.  Neurosurgery has ordered an MRI of the brain.  They are awaiting imaging from her hospitalization for review and comparison. Assessment  1. SAH (subarachnoid hemorrhage) ([place])   2. SDH (subdural hematoma) ([facility])   3. Paroxysmal atrial fibrillation ([place])   4. Right hand weakness      Plan   Overall, she is much improved.  To complete physical therapy.  Follow-up with neurosurgery for further brain imaging.  Currently, differential for fall is hemorrhagic CVA versus arrhythmia.  Both neurosurgery and cardiology are investigating further.  She remained stable/improved.  No medication changes made today.  Follow up: Return in about 10 weeks (around 06/04/2018) for complete physical.  05/29/2018  No orders of the defined types were placed in this encounter.  No orders of the defined types were placed in this encounter.     I reviewed the patients updated PMH, FH, and SocHx.    Patient Active Problem List   Diagnosis Date Noted  . Long term current use of anticoagulant therapy 07/07/2015    Priority: High  . Major depression, recurrent, chronic ([place]) 02/12/2015    Priority: High  . Atherosclerotic heart disease of native  coronary artery without angina pectoris 05/16/2012    Priority: High  . Generalized anxiety disorder 01/10/2012    Priority: High  . History of non-ST elevation myocardial infarction (NSTEMI) 12/05/2011    Priority: High  . Mixed hyperlipidemia 12/31/2010    Priority: High  . Paroxysmal atrial fibrillation ([place]) 12/14/2010    Priority: High  . Essential hypertension 01/09/2009    Priority: High  . Subclinical hypothyroidism 03/24/2017    Priority: Medium  . DJD (degenerative joint disease), cervical 05/12/2016    Priority: Medium  . Foraminal stenosis of cervical region 08/18/2015    Priority: Medium  . Chronic pain of both knees 02/12/2015    Priority: Medium  . Zenker diverticulum 05/08/2014    Priority: Medium  . Osteopenia 06/08/2012    Priority: Medium  . Gastro-esophageal reflux disease without esophagitis 08/12/2011    Priority: Medium  . Osteoarthritis, hand 12/31/2010    Priority: Low  . Rosacea 12/31/2010    Priority: Low   Current Meds  Medication Sig  . ALPRAZolam (XANAX) 0.5 MG tablet Take 1 tablet (0.5 mg total) by mouth daily as needed.  . butalbital-acetaminophen-caffeine (FIORICET WITH CODEINE) 50-325-40-30 MG capsule Take 1 capsule by mouth every 4 (four) hours as needed for headache.  . carvedilol (COREG) 12.5 MG tablet TK 1 T PO BID WITH MEALS  . Co-Enzyme Q-10 30 MG CAPS Take by mouth.  . diclofenac sodium (VOLTAREN) 1 % GEL Apply 2 g topically 4 (four) times daily. To  hands as needed  . docusate sodium (COLACE) 100 MG capsule Take 100 mg by mouth 2 (two) times daily.  . fluorouracil (EFUDEX) 5 % cream Apply topically 3 (three) times daily.  . fluticasone (FLONASE) 50 MCG/ACT nasal spray USE 1 SPRAY NASALLY DAILY  . lisinopril (PRINIVIL,ZESTRIL) 2.5 MG tablet Take 2.5 mg by mouth daily.   . nitroGLYCERIN (NITROSTAT) 0.4 MG SL tablet Place 0.4 mg under the tongue.  Marland Kitchen omeprazole (PRILOSEC) 40 MG capsule Take 40 mg by mouth daily.   . ranolazine (RANEXA)  500 MG 12 hr tablet Take 500 mg by mouth 2 (two) times daily.   . rosuvastatin (CRESTOR) 40 MG tablet Take 40 mg by mouth daily.   . sertraline (ZOLOFT) 100 MG tablet Take 1.5 tablets (150 mg total) by mouth daily.  Marland Kitchen SYNTHROID 25 MCG tablet Take 1 tablet (25 mcg total) by mouth daily before breakfast.  . VITAMIN D, CHOLECALCIFEROL, PO Take by mouth.    Allergies: Patient has No Known Allergies. Family History: Patient family history includes Alcohol abuse in her father; Atrial fibrillation in her mother; Diabetes in her brother, father, and paternal grandmother; Early death in her father and maternal grandfather; Heart attack in her father, maternal uncle, and paternal grandmother; Kidney disease in her maternal grandmother; Stroke in her maternal aunt, maternal grandfather, paternal aunt, and son; Transient ischemic attack in her mother. Social History:  Patient  reports that she has never smoked. She has never used smokeless tobacco. She reports current alcohol use of about 2.0 - 3.0 standard drinks of alcohol per week. She reports that she does not use drugs.  Review of Systems: Constitutional: Negative for fever malaise or anorexia Cardiovascular: negative for chest pain Respiratory: negative for SOB or persistent cough Gastrointestinal: negative for abdominal pain  Objective  Vitals: BP 104/60   Pulse 63   Temp 97.6 F (36.4 C) (Oral)   Resp 16   Ht 5\' 2"  (1.575 m)   Wt 148 lb (67.1 kg)   SpO2 95%   BMI 27.07 kg/m  General: no acute distress , A&Ox3 HEENT: PEERL, conjunctiva normal, Oropharynx moist,neck is supple Cardiovascular:  RRR without murmur or gallop.  Respiratory:  Good breath sounds bilaterally, CTAB with normal respiratory effort Skin:  Warm, no rashes NEURO: unsteady gait, using cane. Right UE 5/5 strength throughout and improved dexterity.      Commons side effects, risks, benefits, and alternatives for medications and treatment plan prescribed today were  discussed, and the patient expressed understanding of the given instructions. Patient is instructed to call or message via MyChart if he/she has any questions or concerns regarding our treatment plan. No barriers to understanding were identified. We discussed Red Flag symptoms and signs in detail. Patient expressed understanding regarding what to do in case of urgent or emergency type symptoms.   Medication list was reconciled, printed and provided to the patient in AVS. Patient instructions and summary information was reviewed with the patient as documented in the AVS. This note was prepared with assistance of Dragon voice recognition software. Occasional wrong-word or sound-a-like substitutions may have occurred due to the inherent limitations of voice recognition software

## 2018-03-26 NOTE — Patient Instructions (Addendum)
Please follow up as scheduled for your next visit with me: 05/29/2018   If you have any questions or concerns, please don't hesitate to send me a message via MyChart or call the office at [phone]. Thank you for visiting with [place] today! It's our pleasure caring for you.  Follow up with the neurosurgeon.

## 2018-03-28 ENCOUNTER — Encounter: Payer: Self-pay | Admitting: Physical Therapy

## 2018-03-28 ENCOUNTER — Ambulatory Visit: Payer: Medicare Other | Admitting: Physical Therapy

## 2018-03-28 DIAGNOSIS — M25341 Other instability, right hand: Secondary | ICD-10-CM | POA: Diagnosis not present

## 2018-03-28 DIAGNOSIS — R2689 Other abnormalities of gait and mobility: Secondary | ICD-10-CM | POA: Diagnosis not present

## 2018-03-28 DIAGNOSIS — M6281 Muscle weakness (generalized): Secondary | ICD-10-CM | POA: Diagnosis not present

## 2018-03-28 NOTE — Therapy (Signed)
[facility] [address]  [place], [place], [address] Phone: [phone]   Fax:  [phone]  Physical Therapy Treatment  Patient Details  Name: Brittney Cochran MRN: [id number] Date of Birth: Jan 20, 1941 Referring Provider (PT): Billey Chang MD   Encounter Date: 03/28/2018  PT End of Session - 03/28/18 1158    Visit Number  7    Number of Visits  12    Date for PT Re-Evaluation  04/11/18    Authorization Type  UHC MCR    PT Start Time  5465    PT Stop Time  1100    PT Time Calculation (min)  45 min    Activity Tolerance  Patient tolerated treatment well    Behavior During Therapy  [facility] for tasks assessed/performed       Past Medical History:  Diagnosis Date  . Atrial fibrillation ([place])   . Coronary artery disease   . GERD (gastroesophageal reflux disease)    endoscopy and esophageal diatation 2013 Dr. Percell Miller  . Hyperlipemia   . Leiomyoma of body of uterus   . Osteopenia   . Subclinical hypothyroidism 03/24/2017   Started synthroid 25 03/2017 for increasing TSH.    Past Surgical History:  Procedure Laterality Date  . ABLATION OF DYSRHYTHMIC FOCUS    . BLADDER REPAIR  2011  . BREAST BIOPSY Left 08/19/2013  . CAROTID STENT  12/04/2011  . ESOPHAGEAL DILATION  2013  . Floating Kidney  1963  . TUBAL LIGATION  1985    There were no vitals filed for this visit.  Subjective Assessment - 03/28/18 1015    Subjective  Reports she had a stomach virus last week and had to miss last session. Feeling better now. Denies falls since last session.     Pertinent History  PMH-subarachnoid hemmorage and subdural hemotoma.Afib,CAD,MI,anx,dep,OA,cervical DDD and stenosis,osteopenia    Patient Stated Goals  get back to normal    Currently in Pain?  No/denies                       [facility] Adult PT Treatment/Exercise - 03/28/18 0001      Ambulation/Gait   Ambulation/Gait  Yes    Ambulation/Gait Assistance   6: Modified independent (Device/Increase time)    Ambulation Distance (Feet)  100 Feet    Stairs  Yes    Stairs Assistance  4: Min guard    Stair Management Technique  One rail Left;With cane    Number of Stairs  13    Height of Stairs  8    Gait Comments  good sequencing without SPC with ambulation; able to ascend/descend steps with 1 handrail and SPC      Shoulder Exercises: ROM/Strengthening   UBE (Upper Arm Bike)  L 2.5 x 3 min forward/ 3 min back with L UE leading   dropped down to L1.0 for last 1.5 min d/t fatigue     Hand Exercises   In hand manipulation training   R claw grip with red putty x10    Other Hand Exercises  R key pinch with red putty x15    Other Hand Exercises  R tip pinch with yellow and red putty x20 each             PT Education - 03/28/18 1157    Education Details  update to HEP; administered red putty    Person(s) Educated  Patient    Methods  Explanation;Demonstration;Tactile cues;Verbal cues;Handout    Comprehension  Verbalized understanding;Returned demonstration          PT Long Term Goals - 03/02/18 1200      PT LONG TERM GOAL #1   Title  Pt will be I and compliant with HEP. 6 weeks 04/11/18    Status  On-going      PT LONG TERM GOAL #2   Title  Pt will improve Rt hand grip to at least 4+/5 or 10 lbs. 6 weeks 04/11/18    Status  On-going      PT LONG TERM GOAL #3   Title  Pt will improve BERG >47 and DGI >19 to show reduced risk of falls. 6 weeks 04/11/18    Status  On-going      PT LONG TERM GOAL #4   Title  Pt will improve 5TSTS to 15 sec to show improved balance and endurance. 6 weeks 04/11/18    Status  On-going      PT LONG TERM GOAL #5   Title  Pt to ambulate community distances without AD and with good stability. 6 weeks 04/11/18    Status  On-going            Plan - 03/28/18 1158    Clinical Impression Statement  Patient arrived to session with report of stomach virus last week, causing her to miss last appointment.  Feeling better now. Worked on ambulation without SPC- patient demonstrating mild mediolateral instability, however good heel strike and step length throughout. Patient still reporting more comfort with use of SPC out in the community. Practiced stair climbing with SPC and 1 handrail- patient with good stability with step-to pattern with "up with the good, down with the bad" technique, however reporting R knee pain with alternating pattern. Patient scored 11lbs on R grip strength today, 30lbs on L with dynamometer. Was able to progress to red putty with key pinch gripping. Updated HEP to include this exercise and administered red putty. Patient without complaints at end of session.     PT Treatment/Interventions  Moist Heat;Gait training;Stair training;Therapeutic activities;Therapeutic exercise;Balance training;Neuromuscular re-education;Manual techniques;Passive range of motion    PT Next Visit Plan  progress R hand and wrist strength    Consulted and Agree with Plan of Care  Patient       Patient will benefit from skilled therapeutic intervention in order to improve the following deficits and impairments:  Abnormal gait, Decreased activity tolerance, Decreased endurance, Decreased range of motion, Decreased strength, Decreased balance, Difficulty walking, Impaired UE functional use  Visit Diagnosis: Muscle weakness (generalized)  Other abnormalities of gait and mobility  Other instability, right hand     Problem List Patient Active Problem List   Diagnosis Date Noted  . Subclinical hypothyroidism 03/24/2017  . DJD (degenerative joint disease), cervical 05/12/2016  . Foraminal stenosis of cervical region 08/18/2015  . Long term current use of anticoagulant therapy 07/07/2015  . Chronic pain of both knees 02/12/2015  . Major depression, recurrent, chronic ([place]) 02/12/2015  . Zenker diverticulum 05/08/2014  . Osteopenia 06/08/2012  . Atherosclerotic heart disease of native coronary  artery without angina pectoris 05/16/2012  . Generalized anxiety disorder 01/10/2012  . History of non-ST elevation myocardial infarction (NSTEMI) 12/05/2011  . Gastro-esophageal reflux disease without esophagitis 08/12/2011  . Mixed hyperlipidemia 12/31/2010  . Osteoarthritis, hand 12/31/2010  . Rosacea 12/31/2010  . Paroxysmal atrial fibrillation ([place]) 12/14/2010  . Essential hypertension 01/09/2009     Janene Harvey, PT, DPT 03/28/18  12:01 PM   [facility] [address]  [employer], [place], [address] Phone: [phone]   Fax:  [phone]  Name: Brittney Cochran MRN: [id number] Date of Birth: 06-May-1940

## 2018-03-30 ENCOUNTER — Ambulatory Visit: Payer: Medicare Other | Admitting: Physical Therapy

## 2018-03-30 ENCOUNTER — Encounter: Payer: Self-pay | Admitting: Physical Therapy

## 2018-03-30 DIAGNOSIS — M25341 Other instability, right hand: Secondary | ICD-10-CM | POA: Diagnosis not present

## 2018-03-30 DIAGNOSIS — M6281 Muscle weakness (generalized): Secondary | ICD-10-CM | POA: Diagnosis not present

## 2018-03-30 DIAGNOSIS — R2689 Other abnormalities of gait and mobility: Secondary | ICD-10-CM | POA: Diagnosis not present

## 2018-03-30 NOTE — Therapy (Signed)
[facility] [address]  [place], [place], [address] Phone: [phone]   Fax:  [phone]  Physical Therapy Treatment  Patient Details  Name: Brittney Cochran MRN: [id number] Date of Birth: 1940-09-09 Referring Provider (PT): Billey Chang MD   Encounter Date: 03/30/2018  PT End of Session - 03/30/18 1022    Visit Number  8    Number of Visits  12    Date for PT Re-Evaluation  04/11/18    Authorization Type  UHC MCR    PT Start Time  [phone]   patient late   PT Stop Time  0930    PT Time Calculation (min)  38 min    Activity Tolerance  Patient tolerated treatment well;Patient limited by pain    Behavior During Therapy  [facility] for tasks assessed/performed       Past Medical History:  Diagnosis Date  . Atrial fibrillation ([place])   . Coronary artery disease   . GERD (gastroesophageal reflux disease)    endoscopy and esophageal diatation 2013 Dr. Percell Miller  . Hyperlipemia   . Leiomyoma of body of uterus   . Osteopenia   . Subclinical hypothyroidism 03/24/2017   Started synthroid 25 03/2017 for increasing TSH.    Past Surgical History:  Procedure Laterality Date  . ABLATION OF DYSRHYTHMIC FOCUS    . BLADDER REPAIR  2011  . BREAST BIOPSY Left 08/19/2013  . CAROTID STENT  12/04/2011  . ESOPHAGEAL DILATION  2013  . Floating Kidney  1963  . TUBAL LIGATION  1985    There were no vitals filed for this visit.  Subjective Assessment - 03/30/18 0851    Subjective  Patient arrived late- noting that she overslept. Report she has been working with the red putty and having R hand soreness today.     Patient is accompained by:  Family member   husband   Pertinent History  PMH-subarachnoid hemmorage and subdural hemotoma.Afib,CAD,MI,anx,dep,OA,cervical DDD and stenosis,osteopenia    Patient Stated Goals  get back to normal    Currently in Pain?  Yes    Pain Score  2     Pain Location  Hand    Pain Orientation  Right     Pain Descriptors / Indicators  Sore    Pain Type  Acute pain                       [facility] Adult PT Treatment/Exercise - 03/30/18 0001      Ambulation/Gait   Stairs  Yes    Stairs Assistance  4: Min guard    Stair Management Technique  One rail Left;With cane    Number of Stairs  26    Height of Stairs  8    Gait Comments  working on alternating step-to pattern when descending, reciprocal stepping with ascending      Knee/Hip Exercises: Aerobic   Nustep  Lvl 3, 6 min (UE/LE)      Knee/Hip Exercises: Standing   Terminal Knee Extension  Strengthening;Right;Left;1 set;10 reps    Terminal Knee Extension Limitations  10x3"; TKE against ball with B UE support on chair    Lateral Step Up  Right;1 set;10 reps;Hand Hold: 2;Step Height: 4"    Lateral Step Up Limitations  cues for slow eccentric lower    Forward Step Up  Right;1 set;10 reps;Hand Hold: 1;Step Height: 6";Step Height: 8"    Forward Step Up Limitations  cues fro slow  eccentric lower on the way down    Wall Squat  1 set;10 reps    Wall Squat Limitations  mini squat tp avoid R knee pain    Other Standing Knee Exercises  Side stepping with red TB at toes with SPC 2x80ft   more difficulty on L                 PT Long Term Goals - 03/30/18 1025      PT LONG TERM GOAL #1   Title  Pt will be I and compliant with HEP. 6 weeks 04/11/18    Status  Achieved      PT LONG TERM GOAL #2   Title  Pt will improve Rt hand grip to at least 4+/5 or 10 lbs. 6 weeks 04/11/18    Status  Achieved      PT LONG TERM GOAL #3   Title  Pt will improve BERG >47 and DGI >19 to show reduced risk of falls. 6 weeks 04/11/18    Status  On-going      PT LONG TERM GOAL #4   Title  Pt will improve 5TSTS to 15 sec to show improved balance and endurance. 6 weeks 04/11/18    Status  On-going      PT LONG TERM GOAL #5   Title  Pt to ambulate community distances without AD and with good stability. 6 weeks 04/11/18    Status  On-going             Plan - 03/30/18 1025    Clinical Impression Statement  Patient arrived late to session, reporting that she overslept. Reporting working with red putty since last session, and having some soreness in R hand this AM. Focused today's session on LE strengthening and stair climbing as patient still with R knee pain and instability with this activity. Patient was able to perform alternating step-to pattern when descending stairs, and reciprocal stepping when ascending steps. Cues required throughout for sequencing of SPC and ensuring safety. Patient tolerated all LE strengthening ther-ex this session with exception of mini squats which were discontinued d/t R knee pain. No complaints at end of session.     PT Treatment/Interventions  Moist Heat;Gait training;Stair training;Therapeutic activities;Therapeutic exercise;Balance training;Neuromuscular re-education;Manual techniques;Passive range of motion    PT Next Visit Plan  progress R hand and wrist strength, stair climbing    Consulted and Agree with Plan of Care  Patient       Patient will benefit from skilled therapeutic intervention in order to improve the following deficits and impairments:  Abnormal gait, Decreased activity tolerance, Decreased endurance, Decreased range of motion, Decreased strength, Decreased balance, Difficulty walking, Impaired UE functional use  Visit Diagnosis: Muscle weakness (generalized)  Other abnormalities of gait and mobility  Other instability, right hand     Problem List Patient Active Problem List   Diagnosis Date Noted  . Subclinical hypothyroidism 03/24/2017  . DJD (degenerative joint disease), cervical 05/12/2016  . Foraminal stenosis of cervical region 08/18/2015  . Long term current use of anticoagulant therapy 07/07/2015  . Chronic pain of both knees 02/12/2015  . Major depression, recurrent, chronic ([place]) 02/12/2015  . Zenker diverticulum 05/08/2014  . Osteopenia 06/08/2012  .  Atherosclerotic heart disease of native coronary artery without angina pectoris 05/16/2012  . Generalized anxiety disorder 01/10/2012  . History of non-ST elevation myocardial infarction (NSTEMI) 12/05/2011  . Gastro-esophageal reflux disease without esophagitis 08/12/2011  . Mixed hyperlipidemia 12/31/2010  . Osteoarthritis, hand  12/31/2010  . Rosacea 12/31/2010  . Paroxysmal atrial fibrillation ([place]) 12/14/2010  . Essential hypertension 01/09/2009    Janene Harvey, PT, DPT 03/30/18 10:28 AM   [facility] [address]  [place], [place], [address] Phone: [phone]   Fax:  [phone]  Name: RENIYA MCCLEES MRN: [id number] Date of Birth: Mar 17, 1940

## 2018-04-03 ENCOUNTER — Ambulatory Visit: Payer: Medicare Other | Admitting: Physical Therapy

## 2018-04-04 ENCOUNTER — Ambulatory Visit: Payer: Medicare Other

## 2018-04-04 DIAGNOSIS — M25341 Other instability, right hand: Secondary | ICD-10-CM

## 2018-04-04 DIAGNOSIS — R2689 Other abnormalities of gait and mobility: Secondary | ICD-10-CM

## 2018-04-04 DIAGNOSIS — M6281 Muscle weakness (generalized): Secondary | ICD-10-CM

## 2018-04-04 NOTE — Therapy (Signed)
[facility] [address]  [place], [place], [address] Phone: [phone]   Fax:  [phone]  Physical Therapy Treatment  Patient Details  Name: Brittney Cochran MRN: [id number] Date of Birth: 04/26/1940 Referring Provider (PT): Billey Chang MD   Encounter Date: 04/04/2018  PT End of Session - 04/04/18 1019    Visit Number  9    Number of Visits  12    Date for PT Re-Evaluation  04/11/18    Authorization Type  UHC MCR    PT Start Time  5681    PT Stop Time  1053    PT Time Calculation (min)  38 min    Activity Tolerance  Patient tolerated treatment well    Behavior During Therapy  [facility] for tasks assessed/performed       Past Medical History:  Diagnosis Date  . Atrial fibrillation ([place])   . Coronary artery disease   . GERD (gastroesophageal reflux disease)    endoscopy and esophageal diatation 2013 Dr. Percell Miller  . Hyperlipemia   . Leiomyoma of body of uterus   . Osteopenia   . Subclinical hypothyroidism 03/24/2017   Started synthroid 25 03/2017 for increasing TSH.    Past Surgical History:  Procedure Laterality Date  . ABLATION OF DYSRHYTHMIC FOCUS    . BLADDER REPAIR  2011  . BREAST BIOPSY Left 08/19/2013  . CAROTID STENT  12/04/2011  . ESOPHAGEAL DILATION  2013  . Floating Kidney  1963  . TUBAL LIGATION  1985    There were no vitals filed for this visit.  Subjective Assessment - 04/04/18 1018    Subjective  Pt. reporting some ongoing R knee pain which bothered her yesterday which improved with heat and has felt better today.      Patient is accompained by:  Family member   husband   Pertinent History  PMH-subarachnoid hemmorage and subdural hemotoma.Afib,CAD,MI,anx,dep,OA,cervical DDD and stenosis,osteopenia    Patient Stated Goals  get back to normal    Currently in Pain?  Yes    Pain Score  1     Pain Location  Knee    Pain Orientation  Right    Pain Descriptors / Indicators  Sore    Pain Type   Acute pain    Multiple Pain Sites  No                       [facility] Adult PT Treatment/Exercise - 04/04/18 1029      Ambulation/Gait   Stairs  Yes    Stairs Assistance  5: Supervision    Stair Management Technique  One rail Left;With cane;Step to pattern;Alternating pattern    Number of Stairs  6    Height of Stairs  8    Gait Comments  with complaint of R knee pain with alternating pattern; cues required for proper SPC sequencing      Knee/Hip Exercises: Aerobic   Nustep  Lvl 3, 6 min (UE/LE)      Knee/Hip Exercises: Seated   Long Arc Quad  Right;10 reps    Long Arc Quad Limitations  3" hold     Sit to [employer]  10 reps;with UE support;without UE support   with UE support on knees for stand>sit     Hand Exercises   Other Hand Exercises  BOLT box x 5 bolts with R hand on and off    pt. verbalized she likes this activity  Wrist Exercises   Wrist Flexion  Right;15 reps;Bar weights/barbell    Bar Weights/Barbell (Wrist Flexion)  1 lb    Wrist Extension  Right;15 reps;Bar weights/barbell    Bar Weights/Barbell (Wrist Extension)  1 lb    Wrist Radial Deviation  Right;15 reps;Bar weights/barbell    Bar Weights/Barbell (Radial Deviation)  1 lb    Wrist Ulnar Deviation  Right;15 reps    Bar Weights/Barbell (Ulnar Deviation)  1 lb             PT Education - 04/04/18 1420    Education Details  HEP update     Person(s) Educated  Patient    Methods  Explanation;Demonstration;Verbal cues;Handout    Comprehension  Verbalized understanding;Returned demonstration;Verbal cues required;Need further instruction          PT Long Term Goals - 03/30/18 1025      PT LONG TERM GOAL #1   Title  Pt will be I and compliant with HEP. 6 weeks 04/11/18    Status  Achieved      PT LONG TERM GOAL #2   Title  Pt will improve Rt hand grip to at least 4+/5 or 10 lbs. 6 weeks 04/11/18    Status  Achieved      PT LONG TERM GOAL #3   Title  Pt will improve BERG >47 and DGI  >19 to show reduced risk of falls. 6 weeks 04/11/18    Status  On-going      PT LONG TERM GOAL #4   Title  Pt will improve 5TSTS to 15 sec to show improved balance and endurance. 6 weeks 04/11/18    Status  On-going      PT LONG TERM GOAL #5   Title  Pt to ambulate community distances without AD and with good stability. 6 weeks 04/11/18    Status  On-going            Plan - 04/04/18 1020    Clinical Impression Statement  Pt. doing well today.  Denies soreness following session.  Able to tolerated all wrist and hand strengthening activities in session well today.  No issues with addition of R LAQ, sit<>stand activities however closely monitored R knee pain.  Only navigated stairs x 6 steps today due to R knee pain with reciprocal pattern.  Will continue to progress toward goals.      Rehab Potential  Good    PT Frequency  2x / week    PT Duration  6 weeks    PT Treatment/Interventions  Moist Heat;Gait training;Stair training;Therapeutic activities;Therapeutic exercise;Balance training;Neuromuscular re-education;Manual techniques;Passive range of motion    PT Next Visit Plan  Progress toward LE LTG's per pt. considering R knee pain; standing balance LE strengthening per pt. tolerance     Consulted and Agree with Plan of Care  Patient       Patient will benefit from skilled therapeutic intervention in order to improve the following deficits and impairments:  Abnormal gait, Decreased activity tolerance, Decreased endurance, Decreased range of motion, Decreased strength, Decreased balance, Difficulty walking, Impaired UE functional use  Visit Diagnosis: Muscle weakness (generalized)  Other abnormalities of gait and mobility  Other instability, right hand     Problem List Patient Active Problem List   Diagnosis Date Noted  . Subclinical hypothyroidism 03/24/2017  . DJD (degenerative joint disease), cervical 05/12/2016  . Foraminal stenosis of cervical region 08/18/2015  . Long  term current use of anticoagulant therapy 07/07/2015  . Chronic pain  of both knees 02/12/2015  . Major depression, recurrent, chronic ([place]) 02/12/2015  . Zenker diverticulum 05/08/2014  . Osteopenia 06/08/2012  . Atherosclerotic heart disease of native coronary artery without angina pectoris 05/16/2012  . Generalized anxiety disorder 01/10/2012  . History of non-ST elevation myocardial infarction (NSTEMI) 12/05/2011  . Gastro-esophageal reflux disease without esophagitis 08/12/2011  . Mixed hyperlipidemia 12/31/2010  . Osteoarthritis, hand 12/31/2010  . Rosacea 12/31/2010  . Paroxysmal atrial fibrillation ([place]) 12/14/2010  . Essential hypertension 01/09/2009    Bess Harvest, PTA 04/04/18 2:28 PM    [facility] [address]  [place], [place], [address] Phone: [phone]   Fax:  [phone]  Name: Brittney Cochran MRN: [id number] Date of Birth: Feb 09, 1941

## 2018-04-05 ENCOUNTER — Ambulatory Visit: Payer: Medicare Other | Admitting: Physical Therapy

## 2018-04-05 ENCOUNTER — Encounter: Payer: Self-pay | Admitting: Physical Therapy

## 2018-04-05 DIAGNOSIS — M25341 Other instability, right hand: Secondary | ICD-10-CM

## 2018-04-05 DIAGNOSIS — M6281 Muscle weakness (generalized): Secondary | ICD-10-CM | POA: Diagnosis not present

## 2018-04-05 DIAGNOSIS — R2689 Other abnormalities of gait and mobility: Secondary | ICD-10-CM

## 2018-04-05 NOTE — Therapy (Signed)
[facility] [address]  [place], [place], [address] Phone: [phone]   Fax:  [phone]  Physical Therapy Progress Note  Patient Details  Name: Brittney Cochran MRN: [id number] Date of Birth: April 21, 1940 Referring Provider (PT): Billey Chang MD    Progress Note Reporting Period 03/01/19 to 04/05/18  See note below for Objective Data and Assessment of Progress/Goals.    Encounter Date: 04/05/2018  PT End of Session - 04/05/18 1553    Visit Number  10    Number of Visits  18    Date for PT Re-Evaluation  05/03/18    Authorization Type  UHC MCR    PT Start Time  6789    PT Stop Time  1400    PT Time Calculation (min)  47 min    Activity Tolerance  Patient tolerated treatment well    Behavior During Therapy  [facility] for tasks assessed/performed       Past Medical History:  Diagnosis Date  . Atrial fibrillation ([place])   . Coronary artery disease   . GERD (gastroesophageal reflux disease)    endoscopy and esophageal diatation 2013 Dr. Percell Miller  . Hyperlipemia   . Leiomyoma of body of uterus   . Osteopenia   . Subclinical hypothyroidism 03/24/2017   Started synthroid 25 03/2017 for increasing TSH.    Past Surgical History:  Procedure Laterality Date  . ABLATION OF DYSRHYTHMIC FOCUS    . BLADDER REPAIR  2011  . BREAST BIOPSY Left 08/19/2013  . CAROTID STENT  12/04/2011  . ESOPHAGEAL DILATION  2013  . Floating Kidney  1963  . TUBAL LIGATION  1985    There were no vitals filed for this visit.  Subjective Assessment - 04/05/18 1306    Subjective  Patient reports that she would like to continue with PT. Believes that she is getting better- hand use is improving, walking coordination. Still stumbles around without [facility] when at home. Reports that she is being way more active at home. Reports that she felt worn out yesterday after trying sit to stands.      Patient is accompained by:  Family member   husband    Pertinent History  PMH-subarachnoid hemmorage and subdural hemotoma.Afib,CAD,MI,anx,dep,OA,cervical DDD and stenosis,osteopenia    Patient Stated Goals  get back to normal    Currently in Pain?  No/denies         [facility] PT Assessment - 04/05/18 0001      Assessment   Medical Diagnosis  SDH/SAH due to a fall    Referring Provider (PT)  Billey Chang MD    Onset Date/Surgical Date  02/12/18      Strength   Strength Assessment Site  Hand    Right/Left hand  Right;Left    Right Hand Grip (lbs)  1 kg    Left Hand Grip (lbs)  7 kg      Standardized Balance Assessment   Standardized Balance Assessment  Five Times Sit to Stand    Five times sit to stand comments   19.3   pushing off of knees wiht B hands     Berg Balance Test   Sit to Stand  Able to stand without using hands and stabilize independently    Standing Unsupported  Able to stand safely 2 minutes    Sitting with Back Unsupported but Feet Supported on Floor or Stool  Able to sit safely and securely 2 minutes    Stand to  Sit  Sits safely with minimal use of hands    Transfers  Able to transfer safely, minor use of hands    Standing Unsupported with Eyes Closed  Able to stand 10 seconds safely    Standing Ubsupported with Feet Together  Able to place feet together independently and stand 1 minute safely    From Standing, Reach Forward with Outstretched Arm  Can reach forward >12 cm safely (5")    From Standing Position, Pick up Object from [employer] to pick up shoe safely and easily    From Standing Position, Turn to Look Behind Over each Shoulder  Looks behind from both sides and weight shifts well    Turn 360 Degrees  Able to turn 360 degrees safely in 4 seconds or less    Standing Unsupported, Alternately Place Feet on Step/Stool  Able to stand independently and safely and complete 8 steps in 20 seconds    Standing Unsupported, One Foot in Front  Able to place foot tandem independently and hold 30 seconds    Standing on One  Leg  Able to lift leg independently and hold equal to or more than 3 seconds    Total Score  53      Dynamic Gait Index   Level Surface  Normal    Change in Gait Speed  Normal    Gait with Horizontal Head Turns  Mild Impairment    Gait with Vertical Head Turns  Mild Impairment    Gait and Pivot Turn  Normal    Step Over Obstacle  Mild Impairment    Step Around Obstacles  Mild Impairment    Steps  Moderate Impairment    Total Score  18                   [facility] Adult PT Treatment/Exercise - 04/05/18 0001      Knee/Hip Exercises: Aerobic   Nustep  Lvl 3, 6 min (UE/LE)      Knee/Hip Exercises: Seated   Sit to Sand  10 reps;with UE support;1 set   B UE support on knees     Hand Exercises   Digit Composite ABduction Limitations  R finger abduction along table x15; with white rubber band x10    Purdue Pegboard  R hand pegboard inserting and removing 2 rows    Digiticizer  R digitizer 5lbs- full hand squeeze x 15 reps              PT Education - 04/05/18 1603    Education Details  discussion on objective progress with PT thus far and remaining deficits    Person(s) Educated  Patient    Methods  Explanation    Comprehension  Verbalized understanding          PT Long Term Goals - 04/05/18 1307      PT LONG TERM GOAL #1   Title  Pt will be I and compliant with HEP. 6 weeks 04/11/18    Time  4    Period  Weeks    Status  Partially Met   met for current   Target Date  05/03/18      PT LONG TERM GOAL #2   Title  Pt will improve Rt hand grip to at least 4+/5 or 10 lbs. 6 weeks 04/11/18    Time  4    Period  Weeks    Status  On-going   R hand grip 2.2 lbs today  Target Date  05/03/18      PT LONG TERM GOAL #3   Title  Pt will improve BERG >47 and DGI >19 to show reduced risk of falls. 6 weeks 04/11/18    Time  4    Period  Weeks    Status  Partially Met   scored 53/56 on Berg, and 18/24 on DGI   Target Date  05/03/18      PT LONG TERM GOAL #4    Title  Pt will improve 5TSTS to 15 sec to show improved balance and endurance. 6 weeks 04/11/18    Time  4    Period  Weeks    Status  On-going   scored 19.3 sec on 5xSTS with B UEs pushing off knees   Target Date  05/03/18      PT LONG TERM GOAL #5   Title  Pt to ambulate community distances without AD and with good stability. 6 weeks 04/11/18    Time  4    Period  Weeks    Status  On-going   currently ambulating with [facility] for safety   Target Date  05/03/18            Plan - 04/05/18 1600    Clinical Impression Statement  Patient arrived to session with report of good improvement since starting PT- notes that her "hand use and walking coordination" have improved, however still feels like she stumbles when not using her SPC and has limited R hand use. Notes that she would like to continue with PT to address these impairments. Patient reported B hand soreness today as she has hx of RA and reports that her hands were sore from hand ther-ex done yesterday. Patient has scored out of fall risk category on Berg Balance Test- scoring 53/56, however still at risk of falls according to DGI testing- scoring 18/24. All testing performed without use of SPC as patient hopes to transition off of AD eventually. Reviewed STS with patient and advised her to use cushion on the seat to improve ease of movement and compression through her knees. Scored 19.3 sec on 5xSTS, showing room for improvement. Overall, patient is showing great improvement in her gait and balance abilities, more focus required to return to more functional hand strength. Would benefit from additional PT services 2x/week for 4 weeks to address aforementioned impairments. Patient in agreement.      Rehab Potential  Good    PT Frequency  2x / week    PT Duration  4 weeks    PT Treatment/Interventions  Moist Heat;Gait training;Stair training;Therapeutic activities;Therapeutic exercise;Balance training;Neuromuscular re-education;Manual  techniques;Passive range of motion;Cryotherapy;Electrical Stimulation;Functional mobility training;Ultrasound;Patient/family education;Dry needling;Energy conservation;Splinting;Taping    PT Next Visit Plan  progress R hand strength    Consulted and Agree with Plan of Care  Patient       Patient will benefit from skilled therapeutic intervention in order to improve the following deficits and impairments:  Abnormal gait, Decreased activity tolerance, Decreased endurance, Decreased range of motion, Decreased strength, Decreased balance, Difficulty walking, Impaired UE functional use  Visit Diagnosis: Muscle weakness (generalized)  Other abnormalities of gait and mobility  Other instability, right hand     Problem List Patient Active Problem List   Diagnosis Date Noted  . Subclinical hypothyroidism 03/24/2017  . DJD (degenerative joint disease), cervical 05/12/2016  . Foraminal stenosis of cervical region 08/18/2015  . Long term current use of anticoagulant therapy 07/07/2015  . Chronic pain of both knees 02/12/2015  .  Major depression, recurrent, chronic ([place]) 02/12/2015  . Zenker diverticulum 05/08/2014  . Osteopenia 06/08/2012  . Atherosclerotic heart disease of native coronary artery without angina pectoris 05/16/2012  . Generalized anxiety disorder 01/10/2012  . History of non-ST elevation myocardial infarction (NSTEMI) 12/05/2011  . Gastro-esophageal reflux disease without esophagitis 08/12/2011  . Mixed hyperlipidemia 12/31/2010  . Osteoarthritis, hand 12/31/2010  . Rosacea 12/31/2010  . Paroxysmal atrial fibrillation ([place]) 12/14/2010  . Essential hypertension 01/09/2009    Janene Harvey, PT, DPT 04/05/18 4:06 PM   [facility] [address]  [place], [place], [address] Phone: [phone]   Fax:  [phone]  Name: Brittney Cochran MRN: [id number] Date of Birth: August 15, 1940

## 2018-04-10 ENCOUNTER — Ambulatory Visit: Payer: Medicare Other

## 2018-04-10 DIAGNOSIS — R2689 Other abnormalities of gait and mobility: Secondary | ICD-10-CM | POA: Diagnosis not present

## 2018-04-10 DIAGNOSIS — M6281 Muscle weakness (generalized): Secondary | ICD-10-CM | POA: Diagnosis not present

## 2018-04-10 DIAGNOSIS — M25341 Other instability, right hand: Secondary | ICD-10-CM | POA: Diagnosis not present

## 2018-04-10 NOTE — Therapy (Signed)
[facility] [address]  [place], [place], [address] Phone: [phone]   Fax:  [phone]  Physical Therapy Treatment  Patient Details  Name: Brittney Cochran MRN: [id number] Date of Birth: 09-30-1940 Referring Provider (PT): Billey Chang MD   Encounter Date: 04/10/2018  PT End of Session - 04/10/18 1510    Visit Number  11    Number of Visits  18    Date for PT Re-Evaluation  05/03/18    Authorization Type  UHC MCR    PT Start Time  7867    PT Stop Time  1530    PT Time Calculation (min)  43 min    Activity Tolerance  Patient tolerated treatment well    Behavior During Therapy  [facility] for tasks assessed/performed       Past Medical History:  Diagnosis Date  . Atrial fibrillation ([place])   . Coronary artery disease   . GERD (gastroesophageal reflux disease)    endoscopy and esophageal diatation 2013 Dr. Percell Miller  . Hyperlipemia   . Leiomyoma of body of uterus   . Osteopenia   . Subclinical hypothyroidism 03/24/2017   Started synthroid 25 03/2017 for increasing TSH.    Past Surgical History:  Procedure Laterality Date  . ABLATION OF DYSRHYTHMIC FOCUS    . BLADDER REPAIR  2011  . BREAST BIOPSY Left 08/19/2013  . CAROTID STENT  12/04/2011  . ESOPHAGEAL DILATION  2013  . Floating Kidney  1963  . TUBAL LIGATION  1985    There were no vitals filed for this visit.  Subjective Assessment - 04/10/18 1509    Subjective  Pt. reporting some R knee pain which she attributes to cleaning house today.      Patient is accompained by:  Family member   husband   Pertinent History  PMH-subarachnoid hemmorage and subdural hemotoma.Afib,CAD,MI,anx,dep,OA,cervical DDD and stenosis,osteopenia    Patient Stated Goals  get back to normal    Currently in Pain?  No/denies    Pain Score  0-No pain   pain rising up to 2/10    Pain Location  Knee    Pain Orientation  Right    Pain Descriptors / Indicators  Sore    Pain Type   Acute pain    Aggravating Factors   cleaning house today     Pain Relieving Factors  rest     Multiple Pain Sites  No                       [facility] Adult PT Treatment/Exercise - 04/10/18 1524      Elbow Exercises   Forearm Supination  Right;15 reps    Forearm Supination Limitations  hammer mid grip     Forearm Pronation  Right;15 reps    Forearm Pronation Limitations  hammer mid grip     Wrist Flexion  Right;Bar weights/barbell;20 reps    Bar Weights/Barbell (Wrist Flexion)  1 lb    Wrist Extension  Right;Bar weights/barbell;20 reps    Bar Weights/Barbell (Wrist Extension)  1 lb      Shoulder Exercises: ROM/Strengthening   UBE (Upper Arm Bike)  L 2.5 x 3 min forward/ 3 min back with L UE leading      Hand Exercises   Digiticizer  R digitizer 5lbs- full hand squeeze x 20 reps; individual finger squeeze digits 2-5 x 10 rounds     Other Hand Exercises  BOLT box x10  bolts with R hand on and off       Wrist Exercises   Wrist Radial Deviation  Right;Bar weights/barbell;20 reps    Bar Weights/Barbell (Radial Deviation)  1 lb    Wrist Ulnar Deviation  Right;20 reps;Bar weights/barbell    Bar Weights/Barbell (Ulnar Deviation)  1 lb                  PT Long Term Goals - 04/05/18 1307      PT LONG TERM GOAL #1   Title  Pt will be I and compliant with HEP. 6 weeks 04/11/18    Time  4    Period  Weeks    Status  Partially Met   met for current   Target Date  05/03/18      PT LONG TERM GOAL #2   Title  Pt will improve Rt hand grip to at least 4+/5 or 10 lbs. 6 weeks 04/11/18    Time  4    Period  Weeks    Status  On-going   R hand grip 2.2 lbs today   Target Date  05/03/18      PT LONG TERM GOAL #3   Title  Pt will improve BERG >47 and DGI >19 to show reduced risk of falls. 6 weeks 04/11/18    Time  4    Period  Weeks    Status  Partially Met   scored 53/56 on Berg, and 18/24 on DGI   Target Date  05/03/18      PT LONG TERM GOAL #4   Title  Pt will  improve 5TSTS to 15 sec to show improved balance and endurance. 6 weeks 04/11/18    Time  4    Period  Weeks    Status  On-going   scored 19.3 sec on 5xSTS with B UEs pushing off knees   Target Date  05/03/18      PT LONG TERM GOAL #5   Title  Pt to ambulate community distances without AD and with good stability. 6 weeks 04/11/18    Time  4    Period  Weeks    Status  On-going   currently ambulating with [facility] for safety   Target Date  05/03/18            Plan - 04/10/18 1527    Clinical Impression Statement  Pt. reporting some knee pain after cleaning house for prolonged time earlier today.  Still noting difficulty with R hand movement with "anything that takes detail" at home.  Session focused on R hand/wrist/forearm strengthening activities which were well tolerated.  Able to progress repetitions with all hand and wrist strengthening, and tolerated increased resistance with forearm strengthening.  progressing well toward goals.      Rehab Potential  Good    PT Frequency  2x / week    PT Duration  4 weeks    PT Treatment/Interventions  Moist Heat;Gait training;Stair training;Therapeutic activities;Therapeutic exercise;Balance training;Neuromuscular re-education;Manual techniques;Passive range of motion;Cryotherapy;Electrical Stimulation;Functional mobility training;Ultrasound;Patient/family education;Dry needling;Energy conservation;Splinting;Taping    PT Next Visit Plan  progress R hand strength    Consulted and Agree with Plan of Care  Patient       Patient will benefit from skilled therapeutic intervention in order to improve the following deficits and impairments:  Abnormal gait, Decreased activity tolerance, Decreased endurance, Decreased range of motion, Decreased strength, Decreased balance, Difficulty walking, Impaired UE functional use  Visit Diagnosis: Muscle weakness (generalized)  Other  abnormalities of gait and mobility  Other instability, right  hand     Problem List Patient Active Problem List   Diagnosis Date Noted  . Subclinical hypothyroidism 03/24/2017  . DJD (degenerative joint disease), cervical 05/12/2016  . Foraminal stenosis of cervical region 08/18/2015  . Long term current use of anticoagulant therapy 07/07/2015  . Chronic pain of both knees 02/12/2015  . Major depression, recurrent, chronic ([place]) 02/12/2015  . Zenker diverticulum 05/08/2014  . Osteopenia 06/08/2012  . Atherosclerotic heart disease of native coronary artery without angina pectoris 05/16/2012  . Generalized anxiety disorder 01/10/2012  . History of non-ST elevation myocardial infarction (NSTEMI) 12/05/2011  . Gastro-esophageal reflux disease without esophagitis 08/12/2011  . Mixed hyperlipidemia 12/31/2010  . Osteoarthritis, hand 12/31/2010  . Rosacea 12/31/2010  . Paroxysmal atrial fibrillation ([place]) 12/14/2010  . Essential hypertension 01/09/2009    Bess Harvest, PTA 04/10/18 4:59 PM    [facility] [address]  [place], [place], [address] Phone: [phone]   Fax:  [phone]  Name: ELLYCE LAFEVERS MRN: [id number] Date of Birth: [birth year]

## 2018-04-13 ENCOUNTER — Ambulatory Visit: Payer: Medicare Other

## 2018-04-17 ENCOUNTER — Ambulatory Visit: Payer: Medicare Other | Attending: Family Medicine | Admitting: Physical Therapy

## 2018-04-17 ENCOUNTER — Encounter: Payer: Self-pay | Admitting: Physical Therapy

## 2018-04-17 DIAGNOSIS — M25341 Other instability, right hand: Secondary | ICD-10-CM

## 2018-04-17 DIAGNOSIS — R2689 Other abnormalities of gait and mobility: Secondary | ICD-10-CM | POA: Diagnosis not present

## 2018-04-17 DIAGNOSIS — M6281 Muscle weakness (generalized): Secondary | ICD-10-CM | POA: Insufficient documentation

## 2018-04-17 NOTE — Therapy (Signed)
[facility] [address]  [place], [place], [address] Phone: [phone]   Fax:  [phone]  Physical Therapy Treatment  Patient Details  Name: Brittney Cochran MRN: [id number] Date of Birth: Sep 13, 1940 Referring Provider (PT): Billey Chang MD   Encounter Date: 04/17/2018  PT End of Session - 04/17/18 1444    Visit Number  12    Number of Visits  18    Date for PT Re-Evaluation  05/03/18    Authorization Type  UHC MCR    PT Start Time  5520   patient late   PT Stop Time  1445    PT Time Calculation (min)  36 min    Activity Tolerance  Patient tolerated treatment well    Behavior During Therapy  [facility] for tasks assessed/performed       Past Medical History:  Diagnosis Date  . Atrial fibrillation ([place])   . Coronary artery disease   . GERD (gastroesophageal reflux disease)    endoscopy and esophageal diatation 2013 Dr. Percell Miller  . Hyperlipemia   . Leiomyoma of body of uterus   . Osteopenia   . Subclinical hypothyroidism 03/24/2017   Started synthroid 25 03/2017 for increasing TSH.    Past Surgical History:  Procedure Laterality Date  . ABLATION OF DYSRHYTHMIC FOCUS    . BLADDER REPAIR  2011  . BREAST BIOPSY Left 08/19/2013  . CAROTID STENT  12/04/2011  . ESOPHAGEAL DILATION  2013  . Floating Kidney  1963  . TUBAL LIGATION  1985    There were no vitals filed for this visit.  Subjective Assessment - 04/17/18 1410    Subjective  Reports that she has been more active lately- walking around the house and putting things away. Does not use the cane at home.     Pertinent History  PMH-subarachnoid hemmorage and subdural hemotoma.Afib,CAD,MI,anx,dep,OA,cervical DDD and stenosis,osteopenia    Patient Stated Goals  get back to normal    Currently in Pain?  No/denies                       [facility] Adult PT Treatment/Exercise - 04/17/18 0001      Knee/Hip Exercises: Aerobic   Recumbent Bike  L1 x  30mn      Knee/Hip Exercises: Standing   Knee Flexion  Strengthening;Right;1 set;10 reps   showing some difficulty   Knee Flexion Limitations  HS curl at counter top; 3#    Terminal Knee Extension  Strengthening;Right;1 set;10 reps    Terminal Knee Extension Limitations  10x3"; TKE with blue TB and 1 UE support on chair    Lateral Step Up  Right;1 set;10 reps;Hand Hold: 2;Step Height: 4"    Lateral Step Up Limitations  cues to decrease dependence on UEs    Forward Step Up  Right;1 set;10 reps;Hand Hold: 1;Step Height: 6";Step Height: 8"    Forward Step Up Limitations  cues to decrease speed with step down    Other Standing Knee Exercises  Side stepping with yellow TB at toes along counter top x2 lengths of counter; monster walk with yellow TB 2x length of counter   with CGA for balance     Hand Exercises   Other Hand Exercises  B hand red putty pull apart x10    Other Hand Exercises  R hand marble pick up with thumb pinch in fingers 2-4 x5 min      Wrist Exercises   Wrist  Radial Deviation  Right;Bar weights/barbell;10 reps    Bar Weights/Barbell (Radial Deviation)  1 lb    Wrist Ulnar Deviation  Right;Bar weights/barbell;10 reps    Bar Weights/Barbell (Ulnar Deviation)  1 lb    Other wrist exercises  R elbow pronation/supination with 1# x10; 2# x10                  PT Long Term Goals - 04/05/18 1307      PT LONG TERM GOAL #1   Title  Pt will be I and compliant with HEP. 6 weeks 04/11/18    Time  4    Period  Weeks    Status  Partially Met   met for current   Target Date  05/03/18      PT LONG TERM GOAL #2   Title  Pt will improve Rt hand grip to at least 4+/5 or 10 lbs. 6 weeks 04/11/18    Time  4    Period  Weeks    Status  On-going   R hand grip 2.2 lbs today   Target Date  05/03/18      PT LONG TERM GOAL #3   Title  Pt will improve BERG >47 and DGI >19 to show reduced risk of falls. 6 weeks 04/11/18    Time  4    Period  Weeks    Status  Partially Met    scored 53/56 on Berg, and 18/24 on DGI   Target Date  05/03/18      PT LONG TERM GOAL #4   Title  Pt will improve 5TSTS to 15 sec to show improved balance and endurance. 6 weeks 04/11/18    Time  4    Period  Weeks    Status  On-going   scored 19.3 sec on 5xSTS with B UEs pushing off knees   Target Date  05/03/18      PT LONG TERM GOAL #5   Title  Pt to ambulate community distances without AD and with good stability. 6 weeks 04/11/18    Time  4    Period  Weeks    Status  On-going   currently ambulating with [facility] for safety   Target Date  05/03/18            Plan - 04/17/18 1444    Clinical Impression Statement  Patient arrived late to session with report that she has been more active at home lately. Introduced [employer] with banned resistance with good tolerance. Able to perform HS curl with 3 lbs with some difficulty d/t weakness, but no pain. Worked on anterior and lateral step ups with small step to avoid R knee pain, patient required some cues to avoid bracing with UEs and to decrease speed with descending. Worked on R wrist and elbow strengthening with good control. Patient admitted to not performing putty exercises d/t being busy- advised her to continue, as these exercises as the most challenging for her. Patient agreeable. Ended session with no complaints.     PT Treatment/Interventions  Moist Heat;Gait training;Stair training;Therapeutic activities;Therapeutic exercise;Balance training;Neuromuscular re-education;Manual techniques;Passive range of motion;Cryotherapy;Electrical Stimulation;Functional mobility training;Ultrasound;Patient/family education;Dry needling;Energy conservation;Splinting;Taping    PT Next Visit Plan  progress R hand strength and stair training    Consulted and Agree with Plan of Care  Patient       Patient will benefit from skilled therapeutic intervention in order to improve the following deficits and impairments:  Abnormal gait, Decreased activity  tolerance, Decreased  endurance, Decreased range of motion, Decreased strength, Decreased balance, Difficulty walking, Impaired UE functional use  Visit Diagnosis: Muscle weakness (generalized)  Other abnormalities of gait and mobility  Other instability, right hand     Problem List Patient Active Problem List   Diagnosis Date Noted  . Subclinical hypothyroidism 03/24/2017  . DJD (degenerative joint disease), cervical 05/12/2016  . Foraminal stenosis of cervical region 08/18/2015  . Long term current use of anticoagulant therapy 07/07/2015  . Chronic pain of both knees 02/12/2015  . Major depression, recurrent, chronic ([place]) 02/12/2015  . Zenker diverticulum 05/08/2014  . Osteopenia 06/08/2012  . Atherosclerotic heart disease of native coronary artery without angina pectoris 05/16/2012  . Generalized anxiety disorder 01/10/2012  . History of non-ST elevation myocardial infarction (NSTEMI) 12/05/2011  . Gastro-esophageal reflux disease without esophagitis 08/12/2011  . Mixed hyperlipidemia 12/31/2010  . Osteoarthritis, hand 12/31/2010  . Rosacea 12/31/2010  . Paroxysmal atrial fibrillation ([place]) 12/14/2010  . Essential hypertension 01/09/2009    Janene Harvey, PT, DPT 04/17/18 5:14 PM   [facility] [address]  [place], [place], [address] Phone: [phone]   Fax:  [phone]  Name: MYCHELLE KENDRA MRN: [id number] Date of Birth: 1940-10-19

## 2018-04-18 DIAGNOSIS — I614 Nontraumatic intracerebral hemorrhage in cerebellum: Secondary | ICD-10-CM | POA: Diagnosis not present

## 2018-04-18 DIAGNOSIS — S06360A Traumatic hemorrhage of cerebrum, unspecified, without loss of consciousness, initial encounter: Secondary | ICD-10-CM | POA: Diagnosis not present

## 2018-04-19 ENCOUNTER — Other Ambulatory Visit: Payer: Self-pay | Admitting: Family Medicine

## 2018-04-19 ENCOUNTER — Telehealth: Payer: Self-pay | Admitting: Family Medicine

## 2018-04-19 DIAGNOSIS — I63412 Cerebral infarction due to embolism of left middle cerebral artery: Secondary | ICD-10-CM | POA: Diagnosis not present

## 2018-04-19 NOTE — Telephone Encounter (Signed)
Refill from Pharmacy pended to Dr. Jonni Sanger

## 2018-04-19 NOTE — Telephone Encounter (Signed)
Last OV: 03/26/18 Last Fill: 02/22/18 30 with no refills

## 2018-04-19 NOTE — Telephone Encounter (Signed)
Will assess use at upcoming appt. Had been rare use for anxiety.  04/20/2018

## 2018-04-19 NOTE — Telephone Encounter (Signed)
Copied from [place] [id number]. Topic: Quick Communication - See Telephone Encounter >> Apr 19, 2018 11:56 AM Conception Chancy, NT wrote: CRM for notification. See Telephone encounter for: 04/19/18.  Patient is calling and requesting a refill on ALPRAZolam (XANAX) 0.5 MG tablet.   [id number] - [place], [place] - [address] [place] [address] Phone: [phone] Fax: [phone]

## 2018-04-20 ENCOUNTER — Encounter: Payer: Self-pay | Admitting: Family Medicine

## 2018-04-20 ENCOUNTER — Ambulatory Visit (INDEPENDENT_AMBULATORY_CARE_PROVIDER_SITE_OTHER): Payer: Medicare Other | Admitting: Family Medicine

## 2018-04-20 ENCOUNTER — Other Ambulatory Visit: Payer: Self-pay

## 2018-04-20 VITALS — BP 112/62 | HR 74 | Temp 97.7°F | Resp 16 | Ht 62.0 in | Wt 148.2 lb

## 2018-04-20 DIAGNOSIS — S065XAA Traumatic subdural hemorrhage with loss of consciousness status unknown, initial encounter: Secondary | ICD-10-CM

## 2018-04-20 DIAGNOSIS — S065X9A Traumatic subdural hemorrhage with loss of consciousness of unspecified duration, initial encounter: Secondary | ICD-10-CM

## 2018-04-20 DIAGNOSIS — I619 Nontraumatic intracerebral hemorrhage, unspecified: Secondary | ICD-10-CM

## 2018-04-20 DIAGNOSIS — I609 Nontraumatic subarachnoid hemorrhage, unspecified: Secondary | ICD-10-CM

## 2018-04-20 DIAGNOSIS — I480 Paroxysmal atrial fibrillation: Secondary | ICD-10-CM

## 2018-04-20 DIAGNOSIS — F411 Generalized anxiety disorder: Secondary | ICD-10-CM

## 2018-04-20 NOTE — Patient Instructions (Signed)
Please follow up as scheduled for your next visit with me: 06/05/2018   If you have any questions or concerns, please don't hesitate to send me a message via MyChart or call the office at [phone]. Thank you for visiting with [place] today! It's our pleasure caring for you.

## 2018-04-20 NOTE — Progress Notes (Signed)
Subjective  CC:  Chief Complaint  Patient presents with  . Follow-up    Saw. Dr. Saintclair Halsted 04/19/18 and found out that her fall was due to a stroke.    HPI: Brittney Cochran is a 78 y.o. female who presents to the office today to address the problems listed above in the chief complaint.  Here for f/u after seeing NS. I do not have his notes but I did review the brain MRI results that show a healing left frontal hemorrhagic CVA. This news has startled her and has her anxiety up. She requested a refill on her xanax yesterday which is atypical; she had been hardly ever using it prior to her fall, but now feels overwhelmed at times.   Fortunately, she has recovered well.  Still in PT. Reports stronger, good use of her right hand and denies frontal lobe problems.   Seeing cards: s/p ablation for PAF and feels she doesn't flip in and out of AF. On baby aspirin now.   Assessment  1. Hemorrhagic cerebrovascular accident (CVA) ([place])   2. SAH (subarachnoid hemorrhage) ([place])   3. SDH (subdural hematoma) ([facility])   4. Paroxysmal atrial fibrillation ([place])      Plan   Hemorrhagic CVA:   To see Dr. Leonie Man for his evaluation and input. I believe her CVA was due to anticoagulation status. Would favor not being place back on anticoagulants but cards and neuro will decide. On baby ASA now. Reassured.   Refilled xanax but discussed limited use. Continue zoloft and consider addition of wellbutrin if anxiety worsens. I believe counseling today has helped to calm her fears.   Follow up: cpe  06/05/2018  No orders of the defined types were placed in this encounter.  No orders of the defined types were placed in this encounter.     I reviewed the patients updated PMH, FH, and SocHx.    Patient Active Problem List   Diagnosis Date Noted  . Hemorrhagic cerebrovascular accident (CVA) ([place]) 04/20/2018    Priority: High  . Long term current use of anticoagulant therapy 07/07/2015    Priority: High  . Major  depression, recurrent, chronic ([place]) 02/12/2015    Priority: High  . Atherosclerotic heart disease of native coronary artery without angina pectoris 05/16/2012    Priority: High  . Generalized anxiety disorder 01/10/2012    Priority: High  . History of non-ST elevation myocardial infarction (NSTEMI) 12/05/2011    Priority: High  . Mixed hyperlipidemia 12/31/2010    Priority: High  . Paroxysmal atrial fibrillation ([place]) 12/14/2010    Priority: High  . Essential hypertension 01/09/2009    Priority: High  . Subclinical hypothyroidism 03/24/2017    Priority: Medium  . DJD (degenerative joint disease), cervical 05/12/2016    Priority: Medium  . Foraminal stenosis of cervical region 08/18/2015    Priority: Medium  . Chronic pain of both knees 02/12/2015    Priority: Medium  . Zenker diverticulum 05/08/2014    Priority: Medium  . Osteopenia 06/08/2012    Priority: Medium  . Gastro-esophageal reflux disease without esophagitis 08/12/2011    Priority: Medium  . Osteoarthritis, hand 12/31/2010    Priority: Low  . Rosacea 12/31/2010    Priority: Low   Current Meds  Medication Sig  . ALPRAZolam (XANAX) 0.5 MG tablet TAKE 1 TABLET BY MOUTH DAILY AS NEEDED  . aspirin EC 81 MG tablet Take 81 mg by mouth.  . butalbital-acetaminophen-caffeine (FIORICET WITH CODEINE) 50-325-40-30 MG capsule Take 1  capsule by mouth every 4 (four) hours as needed for headache.  . carvedilol (COREG) 12.5 MG tablet TK 1 T PO BID WITH MEALS  . Co-Enzyme Q-10 30 MG CAPS Take by mouth.  . diclofenac sodium (VOLTAREN) 1 % GEL Apply 2 g topically 4 (four) times daily. To hands as needed  . docusate sodium (COLACE) 100 MG capsule Take 100 mg by mouth 2 (two) times daily.  . fluorouracil (EFUDEX) 5 % cream Apply topically 3 (three) times daily.  . fluticasone (FLONASE) 50 MCG/ACT nasal spray USE 1 SPRAY NASALLY DAILY  . levETIRAcetam (KEPPRA) 1000 MG tablet Take 1,000 mg by mouth 2 (two) times daily.  Marland Kitchen lisinopril  (PRINIVIL,ZESTRIL) 2.5 MG tablet Take 2.5 mg by mouth daily.   . nitroGLYCERIN (NITROSTAT) 0.4 MG SL tablet Place 0.4 mg under the tongue.  Marland Kitchen omeprazole (PRILOSEC) 40 MG capsule Take 40 mg by mouth daily.   . ranolazine (RANEXA) 500 MG 12 hr tablet Take 500 mg by mouth 2 (two) times daily.   . rosuvastatin (CRESTOR) 40 MG tablet Take 40 mg by mouth daily.   . sertraline (ZOLOFT) 100 MG tablet Take 1.5 tablets (150 mg total) by mouth daily.  Marland Kitchen SYNTHROID 25 MCG tablet Take 1 tablet (25 mcg total) by mouth daily before breakfast.  . VITAMIN D, CHOLECALCIFEROL, PO Take by mouth.    Allergies: Patient has No Known Allergies. Family History: Patient family history includes Alcohol abuse in her father; Atrial fibrillation in her mother; Diabetes in her brother, father, and paternal grandmother; Early death in her father and maternal grandfather; Heart attack in her father, maternal uncle, and paternal grandmother; Kidney disease in her maternal grandmother; Stroke in her maternal aunt, maternal grandfather, paternal aunt, and son; Transient ischemic attack in her mother. Social History:  Patient  reports that she has never smoked. She has never used smokeless tobacco. She reports current alcohol use of about 2.0 - 3.0 standard drinks of alcohol per week. She reports that she does not use drugs.  Review of Systems: Constitutional: Negative for fever malaise or anorexia Cardiovascular: negative for chest pain Respiratory: negative for SOB or persistent cough Gastrointestinal: negative for abdominal pain  Objective  Vitals: BP 112/62   Pulse 74   Temp 97.7 F (36.5 C) (Oral)   Resp 16   Ht 5\' 2"  (1.575 m)   Wt 148 lb 3.2 oz (67.2 kg)   SpO2 98%   BMI 27.11 kg/m  General: no acute distress , A&Ox3 HEENT: [place]ER[place], conjunctiva normal, Oropharynx moist,neck is supple Cardiovascular:  RRR without murmur or gallop.  Respiratory:  Good breath sounds bilaterally, CTAB with normal respiratory  effort Skin:  Warm, no rashes     Commons side effects, risks, benefits, and alternatives for medications and treatment plan prescribed today were discussed, and the patient expressed understanding of the given instructions. Patient is instructed to call or message via MyChart if he/she has any questions or concerns regarding our treatment plan. No barriers to understanding were identified. We discussed Red Flag symptoms and signs in detail. Patient expressed understanding regarding what to do in case of urgent or emergency type symptoms.   Medication list was reconciled, printed and provided to the patient in AVS. Patient instructions and summary information was reviewed with the patient as documented in the AVS. This note was prepared with assistance of Dragon voice recognition software. Occasional wrong-word or sound-a-like substitutions may have occurred due to the inherent limitations of voice recognition software

## 2018-04-24 ENCOUNTER — Encounter: Payer: Self-pay | Admitting: Physical Therapy

## 2018-04-24 ENCOUNTER — Ambulatory Visit: Payer: Medicare Other | Admitting: Physical Therapy

## 2018-04-24 DIAGNOSIS — R2689 Other abnormalities of gait and mobility: Secondary | ICD-10-CM

## 2018-04-24 DIAGNOSIS — M25341 Other instability, right hand: Secondary | ICD-10-CM | POA: Diagnosis not present

## 2018-04-24 DIAGNOSIS — M6281 Muscle weakness (generalized): Secondary | ICD-10-CM | POA: Diagnosis not present

## 2018-04-24 NOTE — Therapy (Signed)
Brittney Cochran [address]  [place], [place], [address] Phone: [phone]   Fax:  [phone]  Physical Therapy Treatment  Patient Details  Name: Brittney Cochran MRN: [id number] Date of Birth: June 12, 1940 Referring Provider (PT): Billey Chang MD   Encounter Date: 04/24/2018  PT End of Session - 04/24/18 1357    Visit Number  13    Number of Visits  18    Date for PT Re-Evaluation  05/03/18    Authorization Type  UHC MCR    PT Start Time  7858    PT Stop Time  8502    PT Time Calculation (min)  45 min    Activity Tolerance  Patient tolerated treatment well    Behavior During Therapy  [facility] for tasks assessed/performed       Past Medical History:  Diagnosis Date  . Atrial fibrillation ([place])   . Coronary artery disease   . GERD (gastroesophageal reflux disease)    endoscopy and esophageal diatation 2013 Dr. Percell Miller  . Hyperlipemia   . Leiomyoma of body of uterus   . Osteopenia   . Subclinical hypothyroidism 03/24/2017   Started synthroid 25 03/2017 for increasing TSH.    Past Surgical History:  Procedure Laterality Date  . ABLATION OF DYSRHYTHMIC FOCUS    . BLADDER REPAIR  2011  . BREAST BIOPSY Left 08/19/2013  . CAROTID STENT  12/04/2011  . ESOPHAGEAL DILATION  2013  . Floating Kidney  1963  . TUBAL LIGATION  1985    There were no vitals filed for this visit.  Subjective Assessment - 04/24/18 1311    Subjective  Patient reports that she saw her MD who confirmed that her hemmorhage was d/t a stroke rather than a fall. Reports that she worked with the putty over the weekend. R hand is getting stronger.     Pertinent History  PMH-subarachnoid hemmorage and subdural hemotoma.Afib,CAD,MI,anx,dep,OA,cervical DDD and stenosis,osteopenia    Limitations  Lifting;Standing;Walking;Writing    Patient Stated Goals  get back to normal    Currently in Pain?  No/denies                       [facility] Adult  PT Treatment/Exercise - 04/24/18 0001      Elbow Exercises   Forearm Supination  Strengthening;Right;15 reps;Seated    Forearm Supination Limitations  hammer mid grip   cues for elbow by side   Forearm Pronation  Strengthening;Right;15 reps;Seated    Forearm Pronation Limitations  hammer mid grip   cues for elbow by side     Shoulder Exercises: ROM/Strengthening   UBE (Upper Arm Bike)  L 2.5 x 3 min forward/ 3 min back      Hand Exercises   PIPJ Flexion  Strengthening;Right;5 reps    PIPJ Flexion Limitations  red putty digit 2-5 pinch grip 5x each    Digit Composite ABduction Limitations  R finger abduction with white rubber band 2x10   cues for increased ROM on index and middle finger   Digit Composite Adduction Limitations  R finger adduction with yellow putty x5 reps between each finger    [employer]ue Pegboard  with R index and thumb; 5 min    Digiticizer  R digitizer 7lbs- full hand squeeze x 15 reps; isolating digits 2-5 2x15 with 3 lbs    cues for rhythmic breathing   Fine Motor Coordination  --    Other Hand Exercises  R  towel grip 10x5" with focus on digits 4&5    Other Hand Exercises  R hand key pinch 10x with red putty      Wrist Exercises   Wrist Radial Deviation  Strengthening;Right;15 reps;Standing    Wrist Radial Deviation Limitations  standing with hammer    Wrist Ulnar Deviation  Strengthening;Right;15 reps;Standing    Wrist Ulnar Deviation Limitations  standing with hammer             PT Education - 04/24/18 1357    Education Details  update to HEP    Person(s) Educated  Patient    Methods  Explanation;Demonstration;Tactile cues;Verbal cues;Handout    Comprehension  Verbalized understanding;Returned demonstration          PT Long Term Goals - 04/05/18 1307      PT LONG TERM GOAL #1   Title  Pt will be I and compliant with HEP. 6 weeks 04/11/18    Time  4    Period  Weeks    Status  Partially Met   met for current   Target Date  05/03/18      PT  LONG TERM GOAL #2   Title  Pt will improve Rt hand grip to at least 4+/5 or 10 lbs. 6 weeks 04/11/18    Time  4    Period  Weeks    Status  On-going   R hand grip 2.2 lbs today   Target Date  05/03/18      PT LONG TERM GOAL #3   Title  Pt will improve BERG >47 and DGI >19 to show reduced risk of falls. 6 weeks 04/11/18    Time  4    Period  Weeks    Status  Partially Met   scored 53/56 on Berg, and 18/24 on DGI   Target Date  05/03/18      PT LONG TERM GOAL #4   Title  Pt will improve 5TSTS to 15 sec to show improved balance and endurance. 6 weeks 04/11/18    Time  4    Period  Weeks    Status  On-going   scored 19.3 sec on 5xSTS with B UEs pushing off knees   Target Date  05/03/18      PT LONG TERM GOAL #5   Title  Pt to ambulate community distances without AD and with good stability. 6 weeks 04/11/18    Time  4    Period  Weeks    Status  On-going   currently ambulating with [facility] for safety   Target Date  05/03/18            Plan - 04/24/18 1442    Clinical Impression Statement  Patient arrived to session with no new complaints. Worked on progressive wrist and elbow strengthening this session with hammer as resistance with good ROM and tolerance. Patient with remaining difficulty with grip on digits 4 and 5. Worked on isolating grip with these digits with good effort by patient. Required intermittent cues for rhythmic breathing to avoid Valsalva maneuver. Updated HEP to reflect today's session. Patient reported understanding and with no complaints at end of session.     PT Treatment/Interventions  Moist Heat;Gait training;Stair training;Therapeutic activities;Therapeutic exercise;Balance training;Neuromuscular re-education;Manual techniques;Passive range of motion;Cryotherapy;Electrical Stimulation;Functional mobility training;Ultrasound;Patient/family education;Dry needling;Energy conservation;Splinting;Taping    PT Next Visit Plan  progress R hand strength and stair training     Consulted and Agree with Plan of Care  Patient  Patient will benefit from skilled therapeutic intervention in order to improve the following deficits and impairments:  Abnormal gait, Decreased activity tolerance, Decreased endurance, Decreased range of motion, Decreased strength, Decreased balance, Difficulty walking, Impaired UE functional use  Visit Diagnosis: Muscle weakness (generalized)  Other abnormalities of gait and mobility  Other instability, right hand     Problem List Patient Active Problem List   Diagnosis Date Noted  . Hemorrhagic cerebrovascular accident (CVA) ([place]) 04/20/2018  . Subclinical hypothyroidism 03/24/2017  . DJD (degenerative joint disease), cervical 05/12/2016  . Foraminal stenosis of cervical region 08/18/2015  . Long term current use of anticoagulant therapy 07/07/2015  . Chronic pain of both knees 02/12/2015  . Major depression, recurrent, chronic ([place]) 02/12/2015  . Zenker diverticulum 05/08/2014  . Osteopenia 06/08/2012  . Atherosclerotic heart disease of native coronary artery without angina pectoris 05/16/2012  . Generalized anxiety disorder 01/10/2012  . History of non-ST elevation myocardial infarction (NSTEMI) 12/05/2011  . Gastro-esophageal reflux disease without esophagitis 08/12/2011  . Mixed hyperlipidemia 12/31/2010  . Osteoarthritis, hand 12/31/2010  . Rosacea 12/31/2010  . Paroxysmal atrial fibrillation ([place]) 12/14/2010  . Essential hypertension 01/09/2009    Janene Harvey, PT, DPT 04/24/18 2:43 PM   [facility] [address]  [place], [place], [address] Phone: [phone]   Fax:  [phone]  Name: ONYA EUTSLER MRN: [id number] Date of Birth: 1940/04/11

## 2018-04-25 ENCOUNTER — Encounter: Payer: Self-pay | Admitting: Neurology

## 2018-04-25 ENCOUNTER — Ambulatory Visit: Payer: Medicare Other | Admitting: Neurology

## 2018-04-25 VITALS — BP 108/65 | HR 65 | Ht 62.0 in | Wt 148.0 lb

## 2018-04-25 DIAGNOSIS — S06350S Traumatic hemorrhage of left cerebrum without loss of consciousness, sequela: Secondary | ICD-10-CM

## 2018-04-25 NOTE — Patient Instructions (Signed)
I had a long discussion with the patient and her husband regarding her recent unexplained fall followed subsequently by intracerebral hemorrhage likely representing delayed hemorrhagic contusion and unlikely embolic stroke as her focal neurological deficits were not present initially and appeared the next day.  She was on warfarin for primary stroke prevention but she has had A. fib ablation without recurrence for 10 years.  I recommend she discuss with her electrophysiologist whether she can stay on aspirin 81 mg alone or need long-term anticoagulation.  I would prefer Eliquis over warfarin due to reduce risk for intracranial hemorrhage.  Check EEG and if there is no significant cortical irritability consider tapering and stopping Keppra which was started for seizure prophylaxis rather than treatment she may return for follow-up in the future only as necessary.Marland Kitchen

## 2018-04-25 NOTE — Progress Notes (Signed)
[facility] [address] [place]. [place] [address] [phone]       OFFICE CONSULT NOTE  [name] Date of Birth:  07-Nov-1940 Medical Record Number:  [id number]   Referring MD: Kary Kos Reason for Referral: Stroke and intracerebral hemorrhage HPI: Brittney Cochran is a pleasant 78 year old Caucasian lady who is seen today for initial office consultation visit.  She is accompanied by her husband.  History is obtained from both of them as well as review of referral notes and have personally reviewed imaging films in PACS.  Brittney Cochran is a 78 year old lady with past medical history of hypertension, hyperlipidemia, coronary artery disease, chronic atrial fibrillation status post ablation and 2010, anxiety and depression.  She was visiting a friend in [place] when she had an unexplained fall on 02/14/2019.  She states she had just come from outside from a lighted garage with 2 grocery bags in each hand and entered the kitchen when all of a sudden she fell on the floor and hit her left temple over the wooden floor.  She did not lose consciousness.  She was dazed but was able to get up.  There was no focal weakness or speech difficulty.  She had a scalp swelling and soreness.  EMS were called and they arrived and checked on her and found no focal deficits or reason to take her to the hospital.  She had soreness and headache for the rest of the day.  She woke up the next day and noticed that she had right hand weakness and trouble moving her fingers.  [facility] was taken [facility] where a CT scan of the head showed a 2 x 2 centimeter left posterior frontal cortical hemorrhage with slight subarachnoid extension.  She was on warfarin which was reversed using Kcentra.  She was kept under close observation and had neurosurgery consultation and managed conservatively.  She was discharged home and followed up with Dr. Kary Kos who ordered an MRI scan of the brain which was  done on 04/18/2018 which I personally reviewed shows resolving subacute left frontal parenchymal hematoma with some hemosiderin and peripheral enhancement.  Subarachnoid blood has resolved.  Patient states her right hand weakness and clumsiness also resolved in few weeks.  She did undergo outpatient physical occupational therapy which is still ongoing.  She feels she has no subjective weakness now.  The patient was previously on Coumadin prior to this fall and now she has been off all anticonvulsants or antiplatelet agents.  She was started on Keppra for seizure prophylaxis and denies any history suggestive of definite seizure.  She has no new neurological complaints.  She denies any prior history of syncope, fainting, stroke, TIA.  No history of seizures.  She has longstanding history of chronic atrial fibrillation and underwent ablation in 2010 and has remained in sinus rhythm since then.  ROS:   14 system review of systems is positive for runny nose, anxiety, depression and all other systems negative PMH:  Past Medical History:  Diagnosis Date  . Atrial fibrillation ([place])   . Coronary artery disease   . GERD (gastroesophageal reflux disease)    endoscopy and esophageal diatation 2013 Dr. Percell [place]  . Hyperlipemia   . Leiomyoma of body of uterus   . Osteopenia   . Stroke ([place])   . Subclinical hypothyroidism 03/24/2017   Started synthroid 25 03/2017 for increasing TSH.    Social History:  Social History   Socioeconomic History  .  Marital status: Married    Spouse name: Not on file  . Number of children: Not on file  . Years of education: Not on file  . Highest education level: Not on file  Occupational History  . Not on file  Social Needs  . Financial resource strain: Not on file  . Food insecurity:    Worry: Not on file    Inability: Not on file  . Transportation needs:    Medical: Not on file    Non-medical: Not on file  Tobacco Use  . Smoking status: Never Smoker  . Smokeless  tobacco: Never Used  Substance and Sexual Activity  . Alcohol use: Yes    Alcohol/week: 2.0 - 3.0 standard drinks    Types: 2 - 3 Glasses of wine per week    Comment: Red Wine  . Drug use: No  . Sexual activity: Never  Lifestyle  . Physical activity:    Days per week: Not on file    Minutes per session: Not on file  . Stress: Not on file  Relationships  . Social connections:    Talks on phone: Not on file    Gets together: Not on file    Attends religious service: Not on file    Active member of club or organization: Not on file    Attends meetings of clubs or organizations: Not on file    Relationship status: Not on file  . Intimate partner violence:    Fear of current or ex partner: Not on file    Emotionally abused: Not on file    Physically abused: Not on file    Forced sexual activity: Not on file  Other Topics Concern  . Not on file  Social History Narrative  . Not on file    Medications:   Current Outpatient Medications on File Prior to Visit  Medication Sig Dispense Refill  . ALPRAZolam (XANAX) 0.5 MG tablet TAKE 1 TABLET BY MOUTH DAILY AS NEEDED 30 tablet 0  . butalbital-acetaminophen-caffeine (FIORICET WITH CODEINE) 50-325-40-30 MG capsule Take 1 capsule by mouth every 4 (four) hours as needed for headache.    . carvedilol (COREG) 12.5 MG tablet TK 1 T PO BID WITH MEALS  3  . Co-Enzyme Q-10 30 MG CAPS Take by mouth.    . diclofenac sodium (VOLTAREN) 1 % GEL Apply 2 g topically 4 (four) times daily. To hands as needed 400 g 5  . fluorouracil (EFUDEX) 5 % cream Apply topically 3 (three) times daily. 40 g 2  . fluticasone (FLONASE) 50 MCG/ACT nasal spray USE 1 SPRAY NASALLY DAILY    . lisinopril (PRINIVIL,ZESTRIL) 2.5 MG tablet Take 2.5 mg by mouth daily.   2  . nitroGLYCERIN (NITROSTAT) 0.4 MG SL tablet Place 0.4 mg under the tongue.    Marland Kitchen omeprazole (PRILOSEC) 40 MG capsule Take 40 mg by mouth daily.     . ranolazine (RANEXA) 500 MG 12 hr tablet Take 500 mg by  mouth 2 (two) times daily.     . rosuvastatin (CRESTOR) 40 MG tablet Take 40 mg by mouth daily.     . sertraline (ZOLOFT) 100 MG tablet Take 1.5 tablets (150 mg total) by mouth daily. 135 tablet 0  . SYNTHROID 25 MCG tablet Take 1 tablet (25 mcg total) by mouth daily before breakfast. 90 tablet 3  . VITAMIN D, CHOLECALCIFEROL, PO Take by mouth.     No current facility-administered medications on file prior to visit.  Allergies:  No Known Allergies  Physical Exam General: Pleasant elderly Caucasian lady seated, in no evident distress Head: head normocephalic and atraumatic.   Neck: supple with no carotid or supraclavicular bruits Cardiovascular: regular rate and rhythm, no murmurs Musculoskeletal: no deformity Skin:  no rash/petichiae Vascular:  Normal pulses all extremities  Neurologic Exam Mental Status: Awake and fully alert. Oriented to place and time. Recent and remote memory intact. Attention span, concentration and fund of knowledge appropriate. Mood and affect appropriate.  Cranial Nerves: Fundoscopic exam reveals sharp disc margins. Pupils equal, briskly reactive to light. Extraocular movements full without nystagmus. Visual fields full to confrontation. Hearing intact. Facial sensation intact. Face, tongue, palate moves normally and symmetrically.  Motor: Normal bulk and tone. Normal strength in all tested extremity muscles.  Mild weakness of right grip.  Diminished fine finger movements on the right.  Orbits left over right upper extremity. Sensory.: intact to touch , pinprick , position and vibratory sensation.  Coordination: Rapid alternating movements normal in all extremities. Finger-to-nose and heel-to-shin performed accurately bilaterally. Gait and Station: Arises from chair without difficulty. Stance is normal. Gait demonstrates normal stride length and balance . Able to heel, toe and tandem walk with moderate difficulty.  Reflexes: 1+ and symmetric. Toes downgoing.    NIHSS  0 Modified Rankin  1   ASSESSMENT: 78 year old lady with unexplained fall in December 2019 with resultant intracerebral and subarachnoid hemorrhage while on anticoagulation with warfarin for atrial fibrillation.  Exact etiology of fall remains uncertain but the occurrence of focal neurological deficits 24 hours after the fall make traumatic contusion more likely is ischemic stroke symptoms should have been present at the onset.    PLAN: I had a long discussion with the patient and her husband regarding her recent unexplained fall followed subsequently by intracerebral hemorrhage likely representing delayed hemorrhagic contusion and unlikely embolic stroke as her focal neurological deficits were not present initially and appeared the next day.  She was on warfarin for primary stroke prevention but she has had A. fib ablation without recurrence for 10 years.  I recommend she discuss with her electrophysiologist whether she can stay on aspirin 81 mg alone or need long-term anticoagulation.  I would prefer Eliquis over warfarin due to reduce risk for intracranial hemorrhage.  Check EEG and if there is no significant cortical irritability consider tapering and stopping Keppra which was started for seizure prophylaxis rather than treatment .greater than 50% time during this 45-minute consultation visit was spent on counseling and coordination of care about her traumatic intracerebral hemorrhage and discussion about stroke and seizure risk and answering questions .She may return for follow-up in the future only as necessary.Antony Contras, MD  [facility] [address] [place], [place] [address]  Phone [phone] Fax [phone] Note: This document was prepared with digital dictation and possible smart phrase technology. Any transcriptional errors that result from this process are unintentional.

## 2018-04-26 ENCOUNTER — Ambulatory Visit: Payer: Medicare Other | Admitting: Physical Therapy

## 2018-04-26 ENCOUNTER — Encounter: Payer: Self-pay | Admitting: Physical Therapy

## 2018-04-26 DIAGNOSIS — M6281 Muscle weakness (generalized): Secondary | ICD-10-CM

## 2018-04-26 DIAGNOSIS — R2689 Other abnormalities of gait and mobility: Secondary | ICD-10-CM | POA: Diagnosis not present

## 2018-04-26 DIAGNOSIS — M25341 Other instability, right hand: Secondary | ICD-10-CM

## 2018-04-26 NOTE — Therapy (Signed)
Brittney Cochran [address]  Brittney Cochran, [place], [address] Phone: [phone]   Fax:  [phone]  Physical Therapy Treatment  Patient Details  Name: Brittney Cochran MRN: [id number] Date of Birth: 1940/07/13 Referring Provider (PT): Billey Chang MD   Encounter Date: 04/26/2018  PT End of Session - 04/26/18 1428    Visit Number  14    Number of Visits  18    Date for PT Re-Evaluation  05/03/18    Authorization Type  [employer] MCR    PT Start Time  1401    PT Stop Time  4008    PT Time Calculation (min)  44 min    Activity Tolerance  Patient tolerated treatment well    Behavior During Therapy  [facility] for tasks assessed/performed       Past Medical History:  Diagnosis Date  . Atrial fibrillation ([place])   . Coronary artery disease   . GERD (gastroesophageal reflux disease)    endoscopy and esophageal diatation 2013 Dr. Percell [place]  . Hyperlipemia   . Leiomyoma of body of uterus   . Osteopenia   . Stroke ([place])   . Subclinical hypothyroidism 03/24/2017   Started synthroid 25 03/2017 for increasing TSH.    Past Surgical History:  Procedure Laterality Date  . ABLATION OF DYSRHYTHMIC FOCUS    . BLADDER REPAIR  2011  . BREAST BIOPSY Left 08/19/2013  . CAROTID STENT  12/04/2011  . ESOPHAGEAL DILATION  2013  . Floating Kidney  1963  . TUBAL LIGATION  1985    There were no vitals filed for this visit.  Subjective Assessment - 04/26/18 1404    Subjective  Reports she had a good session last time and would like to contoniue working on hand strength today.     Patient is accompained by:  Family member   husband   Pertinent History  PMH-subarachnoid hemmorage and subdural hemotoma.Afib,CAD,MI,anx,dep,OA,cervical DDD and stenosis,osteopenia    Patient Stated Goals  get back to normal    Currently in Pain?  No/denies                       [facility] Adult PT Treatment/Exercise - 04/26/18 0001      Shoulder Exercises:  ROM/Strengthening   UBE (Upper Arm Bike)  L 2.5 x 3 min forward/ 3 min back      Hand Exercises   Digit Composite Adduction Limitations  R finger adduction in between fingers with yellow putty x5 each    Digiticizer  R digitizer 7lbs- full hand squeeze x 15 reps; isolating digits 4-5 2x10 with 3 lbs     Small Pegboard  R hand 3 rows of pegboard- 3:02 min    Fine Motor Coordination  --   tightening & loosening nuts and bolts w/ R hand x8 min   Other Hand Exercises  R hand clothes line placing and removing clothes pins with red, green, blue, black x 19mn    Other Hand Exercises  R hand tracing shapes and lines x7 min   cues to stay within lines             PT Education - 04/26/18 1541    Education Details  edu on use of stress ball for hand strengthening, practicing writing to improve signiature, progress with PT thus far    Person(s) Educated  Patient    Methods  Explanation;Demonstration    Comprehension  Verbalized understanding;Returned demonstration  PT Long Term Goals - 04/05/18 1307      PT LONG TERM GOAL #1   Title  Pt will be I and compliant with HEP. 6 weeks 04/11/18    Time  4    Period  Weeks    Status  Partially Met   met for current   Target Date  05/03/18      PT LONG TERM GOAL #2   Title  Pt will improve Rt hand grip to at least 4+/5 or 10 lbs. 6 weeks 04/11/18    Time  4    Period  Weeks    Status  On-going   R hand grip 2.2 lbs today   Target Date  05/03/18      PT LONG TERM GOAL #3   Title  Pt will improve BERG >47 and DGI >19 to show reduced risk of falls. 6 weeks 04/11/18    Time  4    Period  Weeks    Status  Partially Met   scored 53/56 on [place], and 18/24 on DGI   Target Date  05/03/18      PT LONG TERM GOAL #4   Title  Pt will improve 5TSTS to 15 sec to show improved balance and endurance. 6 weeks 04/11/18    Time  4    Period  Weeks    Status  On-going   scored 19.3 sec on 5xSTS with B UEs pushing off knees   Target Date   05/03/18      PT LONG TERM GOAL #5   Title  Pt to ambulate community distances without AD and with good stability. 6 weeks 04/11/18    Time  4    Period  Weeks    Status  On-going   currently ambulating with [facility] for safety   Target Date  05/03/18            Plan - 04/26/18 1451    Clinical Impression Statement  Patient arrived to session with request to focus on hand strengthening today. Worked on R hand tracing of shapes and lines to work on coordination and grip of pen. Patient with more difficulty with fluid lines rather than sharp lines. Advised patient to practice her signature at home to improve this skill. Patient mentioned difficulty placing her pants on hangers- attempted to simulate this activity with clothespins with patient having most difficulty with black clothespins. Timed peg board with R hand to measure progress- today timed at 3:02 sec. Patient showing good progress with PT thus far. Plan to wrap up within coming visits. Patient in agreement.     PT Treatment/Interventions  Moist Heat;Gait training;Stair training;Therapeutic activities;Therapeutic exercise;Balance training;Neuromuscular re-education;Manual techniques;Passive range of motion;Cryotherapy;Electrical Stimulation;Functional mobility training;Ultrasound;Patient/family education;Dry needling;Energy conservation;Splinting;Taping    PT Next Visit Plan  progress R hand strength and stair training    Consulted and Agree with Plan of Care  Patient       Patient will benefit from skilled therapeutic intervention in order to improve the following deficits and impairments:  Abnormal gait, Decreased activity tolerance, Decreased endurance, Decreased range of motion, Decreased strength, Decreased balance, Difficulty walking, Impaired UE functional use  Visit Diagnosis: Muscle weakness (generalized)  Other abnormalities of gait and mobility  Other instability, right hand     Problem List Patient Active Problem  List   Diagnosis Date Noted  . Hemorrhagic cerebrovascular accident (CVA) ([place]) 04/20/2018  . Subclinical hypothyroidism 03/24/2017  . DJD (degenerative joint disease), cervical 05/12/2016  .  Foraminal stenosis of cervical region 08/18/2015  . Long term current use of anticoagulant therapy 07/07/2015  . Chronic pain of both knees 02/12/2015  . Major depression, recurrent, chronic ([place]) 02/12/2015  . [place] diverticulum 05/08/2014  . Osteopenia 06/08/2012  . Atherosclerotic heart disease of native coronary artery without angina pectoris 05/16/2012  . Generalized anxiety disorder 01/10/2012  . History of non-ST elevation myocardial infarction (NSTEMI) 12/05/2011  . Gastro-esophageal reflux disease without esophagitis 08/12/2011  . Mixed hyperlipidemia 12/31/2010  . Osteoarthritis, hand 12/31/2010  . Rosacea 12/31/2010  . Paroxysmal atrial fibrillation ([place]) 12/14/2010  . Essential hypertension 01/09/2009    Janene Harvey, PT, DPT 04/26/18 3:43 PM   [facility] [address]  [place], [place], [address] Phone: [phone]   Fax:  [phone]  Name: ADDY MCMANNIS MRN: [id number] Date of Birth: Aug 30, 1940

## 2018-05-01 ENCOUNTER — Ambulatory Visit: Payer: Medicare Other

## 2018-05-01 DIAGNOSIS — M6281 Muscle weakness (generalized): Secondary | ICD-10-CM | POA: Diagnosis not present

## 2018-05-01 DIAGNOSIS — M25341 Other instability, right hand: Secondary | ICD-10-CM | POA: Diagnosis not present

## 2018-05-01 DIAGNOSIS — R2689 Other abnormalities of gait and mobility: Secondary | ICD-10-CM | POA: Diagnosis not present

## 2018-05-01 NOTE — Therapy (Addendum)
[facility] [address]  [place], [place], [address] Phone: [phone]   Fax:  [phone]  Physical Therapy Treatment  Patient Details  Name: Brittney Cochran MRN: [id number] Date of Birth: 1940/12/24 Referring Provider (PT): Billey Chang MD   Progress Note Reporting Period 04/10/18 to 05/01/18  See note below for Objective Data and Assessment of Progress/Goals.    Encounter Date: 05/01/2018  PT End of Session - 05/01/18 1505    Visit Number  15    Number of Visits  18    Date for PT Re-Evaluation  05/03/18    Authorization Type  [employer] MCR    PT Start Time  9702    PT Stop Time  1527    PT Time Calculation (min)  40 min    Activity Tolerance  Patient tolerated treatment well    Behavior During Therapy  WFL for tasks assessed/performed       Past Medical History:  Diagnosis Date  . Atrial fibrillation ([place])   . Coronary artery disease   . GERD (gastroesophageal reflux disease)    endoscopy and esophageal diatation 2013 Dr. Percell Miller  . Hyperlipemia   . Leiomyoma of body of uterus   . Osteopenia   . Stroke ([place])   . Subclinical hypothyroidism 03/24/2017   Started synthroid 25 03/2017 for increasing TSH.    Past Surgical History:  Procedure Laterality Date  . ABLATION OF DYSRHYTHMIC FOCUS    . BLADDER REPAIR  2011  . BREAST BIOPSY Left 08/19/2013  . CAROTID STENT  12/04/2011  . ESOPHAGEAL DILATION  2013  . Floating Kidney  1963  . TUBAL LIGATION  1985    There were no vitals filed for this visit.  Subjective Assessment - 05/01/18 1505    Subjective  Pt. reporting she would like to focus on hand strength and grip strength today.      Patient is accompained by:  Family member   husband   Pertinent History  PMH-subarachnoid hemmorage and subdural hemotoma.Afib,CAD,MI,anx,dep,OA,cervical DDD and stenosis,osteopenia    Patient Stated Goals  get back to normal    Currently in Pain?  No/denies    Pain  Score  0-No pain    Multiple Pain Sites  No                       [facility] Adult PT Treatment/Exercise - 05/01/18 0001      Neuro Re-ed    Neuro Re-ed Details   B tandem stance at chair 2 x 10 sec each      Knee/Hip Exercises: Standing   SLS  B SLS 3 x 10 sec each LE; chair support      Knee/Hip Exercises: Seated   Sit to Sand  10 reps;with UE support;1 set   pushoff from knees      Shoulder Exercises: ROM/Strengthening   UBE (Upper Arm Bike)  L 2.5 x 3 min forward/ 3 min back      Hand Exercises   Other Hand Exercises  R hand clothes pins yellow, red, green, blue to tower and back down with three point pinch; R hand clothes pins yellow, red, green to tower and back down with two point pinch x 1 round each     Other Hand Exercises  R hand grip squeezer easiest yellow sping x 20 reps       Wrist Exercises   Wrist Radial Deviation  Right;15 reps;Theraband  Theraband Level (Radial Deviation)  Level 2 (Red)    Wrist Radial Deviation Limitations  seated     Wrist Ulnar Deviation  Right;15 reps;Theraband;Strengthening    Theraband Level (Ulnar Deviation)  Level 2 (Red)    Other wrist exercises  R wrist radial devation x 15 reps     Other wrist exercises  R elbow pronation/supination with hammer (mid grip) x 15 reps               PT Education - 05/01/18 1544    Education Details  HEP update     Person(s) Educated  Patient    Methods  Explanation;Demonstration;Verbal cues;Handout    Comprehension  Verbalized understanding;Returned demonstration;Verbal cues required;Need further instruction          PT Long Term Goals - 04/05/18 1307      PT LONG TERM GOAL #1   Title  Pt will be I and compliant with HEP. 6 weeks 04/11/18    Time  4    Period  Weeks    Status  Partially Met   met for current   Target Date  05/03/18      PT LONG TERM GOAL #2   Title  Pt will improve Rt hand grip to at least 4+/5 or 10 lbs. 6 weeks 04/11/18    Time  4    Period  Weeks     Status  On-going   R hand grip 2.2 lbs today   Target Date  05/03/18      PT LONG TERM GOAL #3   Title  Pt will improve BERG >47 and DGI >19 to show reduced risk of falls. 6 weeks 04/11/18    Time  4    Period  Weeks    Status  Partially Met   scored 53/56 on Berg, and 18/24 on DGI   Target Date  05/03/18      PT LONG TERM GOAL #4   Title  Pt will improve 5TSTS to 15 sec to show improved balance and endurance. 6 weeks 04/11/18    Time  4    Period  Weeks    Status  On-going   scored 19.3 sec on 5xSTS with B UEs pushing off knees   Target Date  05/03/18      PT LONG TERM GOAL #5   Title  Pt to ambulate community distances without AD and with good stability. 6 weeks 04/11/18    Time  4    Period  Weeks    Status  On-going   currently ambulating with [facility] for safety   Target Date  05/03/18            Plan - 05/01/18 1506    Clinical Impression Statement  Pt. reporting she feels like she will be ready to transition to home program following next session as previously discussed with PT in last session.  Tolerated all hand, wrist, and LE strengthening activities well today.  Updated HEP today for additional balance activities.  Will plan for final goal testing and transition to home program at next session.      Rehab Potential  Good    PT Treatment/Interventions  Moist Heat;Gait training;Stair training;Therapeutic activities;Therapeutic exercise;Balance training;Neuromuscular re-education;Manual techniques;Passive range of motion;Cryotherapy;Electrical Stimulation;Functional mobility training;Ultrasound;Patient/family education;Dry needling;Energy conservation;Splinting;Taping    PT Next Visit Plan  transition to home program     Consulted and Agree with Plan of Care  Patient       Patient will benefit from  skilled therapeutic intervention in order to improve the following deficits and impairments:  Abnormal gait, Decreased activity tolerance, Decreased endurance, Decreased  range of motion, Decreased strength, Decreased balance, Difficulty walking, Impaired UE functional use  Visit Diagnosis: Muscle weakness (generalized)  Other abnormalities of gait and mobility  Other instability, right hand     Problem List Patient Active Problem List   Diagnosis Date Noted  . Hemorrhagic cerebrovascular accident (CVA) ([place]) 04/20/2018  . Subclinical hypothyroidism 03/24/2017  . DJD (degenerative joint disease), cervical 05/12/2016  . Foraminal stenosis of cervical region 08/18/2015  . Long term current use of anticoagulant therapy 07/07/2015  . Chronic pain of both knees 02/12/2015  . Major depression, recurrent, chronic ([place]) 02/12/2015  . Zenker diverticulum 05/08/2014  . Osteopenia 06/08/2012  . Atherosclerotic heart disease of native coronary artery without angina pectoris 05/16/2012  . Generalized anxiety disorder 01/10/2012  . History of non-ST elevation myocardial infarction (NSTEMI) 12/05/2011  . Gastro-esophageal reflux disease without esophagitis 08/12/2011  . Mixed hyperlipidemia 12/31/2010  . Osteoarthritis, hand 12/31/2010  . Rosacea 12/31/2010  . Paroxysmal atrial fibrillation ([place]) 12/14/2010  . Essential hypertension 01/09/2009    Bess Harvest, PTA 05/01/18 5:46 PM   [facility] [address]  [place], [place], [address] Phone: [phone]   Fax:  [phone]  Name: Brittney Cochran MRN: [id number] Date of Birth: 1940-08-03  PHYSICAL THERAPY DISCHARGE SUMMARY  Visits from Start of Care: 15  Current functional level related to goals / functional outcomes: Unable to assess; patient did not return after last session   Remaining deficits: Unable to assess   Education / Equipment: HEP  Plan: Patient agrees to discharge.  Patient goals were partially met. Patient is being discharged due to not returning since the last visit.  ?????     Janene Harvey, PT,  DPT 06/04/18 8:50 AM

## 2018-05-03 ENCOUNTER — Ambulatory Visit: Payer: Medicare Other | Admitting: Physical Therapy

## 2018-05-07 DIAGNOSIS — I4821 Permanent atrial fibrillation: Secondary | ICD-10-CM | POA: Diagnosis not present

## 2018-05-08 DIAGNOSIS — R002 Palpitations: Secondary | ICD-10-CM | POA: Diagnosis not present

## 2018-05-23 DIAGNOSIS — I480 Paroxysmal atrial fibrillation: Secondary | ICD-10-CM | POA: Diagnosis not present

## 2018-05-23 DIAGNOSIS — K219 Gastro-esophageal reflux disease without esophagitis: Secondary | ICD-10-CM | POA: Diagnosis not present

## 2018-05-23 DIAGNOSIS — I10 Essential (primary) hypertension: Secondary | ICD-10-CM | POA: Diagnosis not present

## 2018-05-23 DIAGNOSIS — Z7901 Long term (current) use of anticoagulants: Secondary | ICD-10-CM | POA: Diagnosis not present

## 2018-05-23 DIAGNOSIS — I629 Nontraumatic intracranial hemorrhage, unspecified: Secondary | ICD-10-CM | POA: Diagnosis not present

## 2018-05-29 ENCOUNTER — Encounter: Payer: Medicare Other | Admitting: Family Medicine

## 2018-05-30 ENCOUNTER — Ambulatory Visit: Payer: Medicare Other

## 2018-05-30 ENCOUNTER — Other Ambulatory Visit: Payer: Self-pay

## 2018-05-30 DIAGNOSIS — R4182 Altered mental status, unspecified: Secondary | ICD-10-CM

## 2018-05-30 DIAGNOSIS — S06350S Traumatic hemorrhage of left cerebrum without loss of consciousness, sequela: Secondary | ICD-10-CM

## 2018-05-31 ENCOUNTER — Other Ambulatory Visit: Payer: Self-pay | Admitting: Family Medicine

## 2018-06-05 ENCOUNTER — Encounter: Payer: Medicare Other | Admitting: Family Medicine

## 2018-06-06 ENCOUNTER — Telehealth: Payer: Self-pay | Admitting: *Deleted

## 2018-06-06 NOTE — Telephone Encounter (Signed)
Spoke with pt & advised her EEG was normal. She verbalized understanding and appreciation. Her questions were answered.

## 2018-06-06 NOTE — Telephone Encounter (Signed)
-----   Message from Garvin Fila, MD sent at 05/31/2018  3:28 PM EDT ----- Brittney Cochran inform the patient had EEG study was normal

## 2018-07-03 ENCOUNTER — Other Ambulatory Visit: Payer: Self-pay | Admitting: Family Medicine

## 2018-08-01 ENCOUNTER — Other Ambulatory Visit: Payer: Self-pay | Admitting: Family Medicine

## 2018-08-02 NOTE — Telephone Encounter (Signed)
Please call to schedule cpe at patient's convenience. Thanks

## 2018-08-02 NOTE — Telephone Encounter (Signed)
LVM patient to call back and r/s CPE

## 2018-08-22 DIAGNOSIS — I2510 Atherosclerotic heart disease of native coronary artery without angina pectoris: Secondary | ICD-10-CM | POA: Diagnosis not present

## 2018-08-22 DIAGNOSIS — I480 Paroxysmal atrial fibrillation: Secondary | ICD-10-CM | POA: Diagnosis not present

## 2018-08-22 DIAGNOSIS — I629 Nontraumatic intracranial hemorrhage, unspecified: Secondary | ICD-10-CM | POA: Diagnosis not present

## 2018-08-22 DIAGNOSIS — E7800 Pure hypercholesterolemia, unspecified: Secondary | ICD-10-CM | POA: Diagnosis not present

## 2018-08-22 DIAGNOSIS — I25118 Atherosclerotic heart disease of native coronary artery with other forms of angina pectoris: Secondary | ICD-10-CM | POA: Diagnosis not present

## 2018-09-05 DIAGNOSIS — I480 Paroxysmal atrial fibrillation: Secondary | ICD-10-CM | POA: Diagnosis not present

## 2018-09-12 DIAGNOSIS — I480 Paroxysmal atrial fibrillation: Secondary | ICD-10-CM | POA: Diagnosis not present

## 2018-11-12 ENCOUNTER — Other Ambulatory Visit: Payer: Self-pay | Admitting: Family Medicine

## 2018-11-29 ENCOUNTER — Ambulatory Visit (INDEPENDENT_AMBULATORY_CARE_PROVIDER_SITE_OTHER): Payer: Medicare Other | Admitting: Family Medicine

## 2018-11-29 ENCOUNTER — Encounter: Payer: Self-pay | Admitting: Family Medicine

## 2018-11-29 ENCOUNTER — Other Ambulatory Visit: Payer: Self-pay

## 2018-11-29 ENCOUNTER — Ambulatory Visit (INDEPENDENT_AMBULATORY_CARE_PROVIDER_SITE_OTHER): Payer: Medicare Other

## 2018-11-29 VITALS — BP 104/62 | HR 79 | Temp 97.6°F | Resp 16 | Ht 62.0 in | Wt 141.8 lb

## 2018-11-29 DIAGNOSIS — M25561 Pain in right knee: Secondary | ICD-10-CM

## 2018-11-29 DIAGNOSIS — H9193 Unspecified hearing loss, bilateral: Secondary | ICD-10-CM

## 2018-11-29 DIAGNOSIS — F411 Generalized anxiety disorder: Secondary | ICD-10-CM

## 2018-11-29 DIAGNOSIS — Z23 Encounter for immunization: Secondary | ICD-10-CM

## 2018-11-29 DIAGNOSIS — F339 Major depressive disorder, recurrent, unspecified: Secondary | ICD-10-CM

## 2018-11-29 DIAGNOSIS — I480 Paroxysmal atrial fibrillation: Secondary | ICD-10-CM

## 2018-11-29 DIAGNOSIS — G8929 Other chronic pain: Secondary | ICD-10-CM | POA: Diagnosis not present

## 2018-11-29 DIAGNOSIS — I10 Essential (primary) hypertension: Secondary | ICD-10-CM

## 2018-11-29 DIAGNOSIS — M1711 Unilateral primary osteoarthritis, right knee: Secondary | ICD-10-CM | POA: Diagnosis not present

## 2018-11-29 MED ORDER — FLUTICASONE PROPIONATE 50 MCG/ACT NA SUSP: NASAL | 3 refills | Status: DC

## 2018-11-29 MED ORDER — TRIAMCINOLONE ACETONIDE 40 MG/ML IJ SUSP
20.0000 mg | Freq: Once | INTRAMUSCULAR | Status: AC
  Administered 2018-11-29: 20 mg via INTRA_ARTICULAR

## 2018-11-29 NOTE — Progress Notes (Signed)
Subjective  CC:  Chief Complaint  Patient presents with  . Knee Pain    Right knee; on going, past 3 months have been worse.. Wants referral to specialist  . Referral    Wants referral for Autologist    HPI: Brittney Cochran is a 78 y.o. female who presents to the office today to address the problems listed above in the chief complaint.  78 year old with history of intracranial bleed while on Coumadin after a fall, history of PAF, hypertension, anxiety disorder presents with a few complaints being chronic right knee pain that is worse over the last 3 months and hearing problems  Right knee pain: Has had a steroid injection in March 2019.  Reports history of chronic meniscal injury in the past although I do not have an MRI report confirming that.  She notes that over the last 3 months she has had increasing more persistent pain so that is now daily with some swelling.  She feels a popping and sometimes it feels unstable.  No injury.  Hearing loss is worsening.  Now struggling especially due to having to wear masks.  No ear pain.  Would like an audiologic eval for hearing aid if possible.  PAF: Seeing cardiology.  She has had no symptomatic tachycardia since her ablation in 2010.  Recently underwent cardiac monitoring.  She will follow-up next week.  They are trying to decide if she needs to be on a blood thinner or not.  History of intracranial bleed due to fall while on Coumadin.  Levels at that time were in the therapeutic range.  Depression and anxiety are well controlled on Zoloft.  She has used Xanax in the past but recently has rarely used it.  Shows me the bottle that is half full from over a year ago.  Her son is doing well, recovering from his stroke 3 years ago.  Overall her stress levels are down.  No mood concerns today.  History of hypertension on low-dose ACE inhibitor with low readings.  No symptoms of hypotension  She is scheduled for complete physical with blood work in  October.  She is overdue for her mammogram.  She will take a flu shot today. Assessment  1. Chronic pain of right knee   2. Bilateral hearing loss, unspecified hearing loss type   3. Essential hypertension   4. Generalized anxiety disorder   5. Paroxysmal atrial fibrillation (HCC)   6. Major depression, recurrent, chronic (H[facility]) Chronic     Plan   Chronic knee pain: Osteoarthritis of the right knee plus minus internal derangement: Status post steroid injection today.  Icing routine instructions given.  Check x-ray.  May need orthopedic intervention in the near future.  Hearing loss failed hearing screen today, refer to audiology  Hypertension is well controlled.  May be able to come off ACE inhibitor.  Will defer to cardiology who will see her next week  Anxiety depression well-controlled, continue Zoloft with rare Xanax use.  She understands risks and benefits  Intracranial bleed: I reviewed neurology's consult note.  Reason for follow has never been known but given history of delayed neurologic deficits, neurology does believe that it is unlikely that she has had an embolic stroke or hemorrhagic stroke, more likely delayed neurologic symptoms due to contusion from trauma.  For this reason he chooses or would prefer Eliquis if cardiology feels that anticoagulation is needed.  He would also be comfortable with 81 mg aspirin daily.  Follow up: Return for  as scheduled.  01/01/2019  No orders of the defined types were placed in this encounter.  No orders of the defined types were placed in this encounter.     I reviewed the patients updated PMH, FH, and SocHx.    Patient Active Problem List   Diagnosis Date Noted  . Hemorrhagic cerebrovascular accident (CVA) ([place]) 04/20/2018    Priority: High  . Long term current use of anticoagulant therapy 07/07/2015    Priority: High  . Major depression, recurrent, chronic ([place]) 02/12/2015    Priority: High  . Coronary artery disease involving  native coronary artery of native heart without angina pectoris 05/16/2012    Priority: High  . Generalized anxiety disorder 01/10/2012    Priority: High  . History of non-ST elevation myocardial infarction (NSTEMI) 12/05/2011    Priority: High  . Mixed hyperlipidemia 12/31/2010    Priority: High  . Paroxysmal atrial fibrillation ([place]) 12/14/2010    Priority: High  . Essential hypertension 01/09/2009    Priority: High  . Subclinical hypothyroidism 03/24/2017    Priority: Medium  . DJD (degenerative joint disease), cervical 05/12/2016    Priority: Medium  . Foraminal stenosis of cervical region 08/18/2015    Priority: Medium  . Chronic pain of both knees 02/12/2015    Priority: Medium  . Zenker diverticulum 05/08/2014    Priority: Medium  . Osteopenia 06/08/2012    Priority: Medium  . Gastro-esophageal reflux disease without esophagitis 08/12/2011    Priority: Medium  . Osteoarthritis, hand 12/31/2010    Priority: Low  . Rosacea 12/31/2010    Priority: Low   Current Meds  Medication Sig  . ALPRAZolam (XANAX) 0.5 MG tablet TAKE 1 TABLET BY MOUTH DAILY AS NEEDED  . carvedilol (COREG) 12.5 MG tablet TK 1 T PO BID WITH MEALS  . Co-Enzyme Q-10 30 MG CAPS Take by mouth.  . diclofenac sodium (VOLTAREN) 1 % GEL Apply 2 g topically 4 (four) times daily. To hands as needed  . fluorouracil (EFUDEX) 5 % cream Apply topically 3 (three) times daily.  . fluticasone (FLONASE) 50 MCG/ACT nasal spray USE 1 SPRAY NASALLY DAILY  . lisinopril (PRINIVIL,ZESTRIL) 2.5 MG tablet Take 2.5 mg by mouth daily.   . nitroGLYCERIN (NITROSTAT) 0.4 MG SL tablet Place 0.4 mg under the tongue.  Marland Kitchen omeprazole (PRILOSEC) 40 MG capsule Take 40 mg by mouth daily.   . ranolazine (RANEXA) 500 MG 12 hr tablet Take 500 mg by mouth 2 (two) times daily.   . rosuvastatin (CRESTOR) 40 MG tablet Take 40 mg by mouth daily.   . sertraline (ZOLOFT) 100 MG tablet Take 1.5 tablets (150 mg total) by mouth daily.  Marland Kitchen SYNTHROID 25  MCG tablet TAKE 1 TABLET BY MOUTH DAILY BEFORE BREAKFAST  . VITAMIN D, CHOLECALCIFEROL, PO Take by mouth.  . [DISCONTINUED] butalbital-acetaminophen-caffeine (FIORICET WITH CODEINE) 50-325-40-30 MG capsule Take 1 capsule by mouth every 4 (four) hours as needed for headache.    Allergies: Patient has No Known Allergies. Family History: Patient family history includes Alcohol abuse in her father; Atrial fibrillation in her mother; Diabetes in her brother, father, and paternal grandmother; Early death in her father and maternal grandfather; Heart attack in her father, maternal uncle, and paternal grandmother; Kidney disease in her maternal grandmother; Stroke in her mother, paternal aunt, and son; Transient ischemic attack in her mother. Social History:  Patient  reports that she has never smoked. She has never used smokeless tobacco. She reports current alcohol use of about 2.0 -  3.0 standard drinks of alcohol per week. She reports that she does not use drugs.  Review of Systems: Constitutional: Negative for fever malaise or anorexia Cardiovascular: negative for chest pain Respiratory: negative for SOB or persistent cough Gastrointestinal: negative for abdominal pain  Objective  Vitals: BP 104/62   Pulse 79   Temp 97.6 F (36.4 C) (Tympanic)   Resp 16   Ht 5\' 2"  (1.575 m)   Wt 141 lb 12.8 oz (64.3 kg)   SpO2 94%   BMI 25.94 kg/m  General: no acute distress , A&Ox3 HEENT: PEERL, conjunctiva normal, Oropharynx moist,neck is supple Cardiovascular:  RRR without murmur or gallop.  Respiratory:  Good breath sounds bilaterally, CTAB with normal respiratory effort Skin:  Warm, no rashes Right knee: Effusion present, no warmth, positive crepitus, positive McMurray's, negative laxity.  Valgus deformity present  Knee Arthrocentesis with Injection Procedure Note  Pre-operative Diagnosis: right knee pain: osteoarthritis vs internal derangment (meniscus)  Post-operative Diagnosis: same   Indications: Symptom relief from osteoarthritis  Anesthesia: Lidocaine 1% without epinephrine without added sodium bicarbonate  Procedure Details   Verbal consent was obtained for the procedure. Universal time out taken.  The Knee joint was prepped with alcohol and an 18 gauge needle was inserted into the joint from the lateral approach.. Four ml 1% lidocaine and one ml of triamcinolone (KENALOG) 40mg /ml was then injected into the joint through the same needle. The needle was removed and the area cleansed and dressed.  Complications:  None; patient tolerated the procedure well.   Commons side effects, risks, benefits, and alternatives for medications and treatment plan prescribed today were discussed, and the patient expressed understanding of the given instructions. Patient is instructed to call or message via MyChart if he/she has any questions or concerns regarding our treatment plan. No barriers to understanding were identified. We discussed Red Flag symptoms and signs in detail. Patient expressed understanding regarding what to do in case of urgent or emergency type symptoms.   Medication list was reconciled, printed and provided to the patient in AVS. Patient instructions and summary information was reviewed with the patient as documented in the AVS. This note was prepared with assistance of Dragon voice recognition software. Occasional wrong-word or sound-a-like substitutions may have occurred due to the inherent limitations of voice recognition software

## 2018-11-29 NOTE — Patient Instructions (Addendum)
Please follow up as scheduled for your next visit with me: 01/01/2019  Please schedule your mammogram. It is due.  You had a steroid injection today.   Things to be aware of after this injection are listed below:  You may experience no significant improvement or even a slight worsening in your symptoms during the first 24 to 48 hours.  After that we expect your symptoms to improve gradually over the next 2 weeks for the medicine to have its maximal effect.  You should continue to have improvement out to 6 weeks after your injection.  I recommend icing the site of the injection for 20 minutes  1-2 times the day of your injection  You may shower but no swimming, tub bath or Jacuzzi for 24 hours.  If your bandage falls off this does not need to be replaced.  It is appropriate to remove the bandage after 4 hours.  You may resume light activities as tolerated.     POSSIBLE PROCEDURE SIDE EFFECTS: The side effects of the injection are usually fairly minimal however if you may experience some of the following side effects that are usually self-limited and will is off on their own.  If you are concerned please feel free to call the office with questions:             Increased numbness or tingling             Nausea or vomiting             Swelling or bruising at the injection site    Please call our office if if you experience any of the following symptoms over the next week as these can be signs of infection:              Fever greater than 100.58F             Significant swelling at the injection site             Significant redness or drainage from the injection site    Today you were given your flu vaccination.   We will call you with information regarding your referral appointment. Audiology.  If you do not hear from [place] within the next 2 weeks, please let me know. It can take 1-2 weeks to get appointments set up with the specialists.   If you have any questions or concerns, please don't  hesitate to send me a message via MyChart or call the office at [phone]. Thank you for visiting with [place] today! It's our pleasure caring for you.

## 2018-11-30 NOTE — Progress Notes (Signed)
Please call patient: I have reviewed his/her lab results. Xray does show arthritis as expected but it does not look horrible. Hopefully, the steroid shot will help her feel better again. If it doesn't, then I'd refer to ortho.

## 2018-12-03 DIAGNOSIS — I480 Paroxysmal atrial fibrillation: Secondary | ICD-10-CM | POA: Diagnosis not present

## 2018-12-03 DIAGNOSIS — I10 Essential (primary) hypertension: Secondary | ICD-10-CM | POA: Diagnosis not present

## 2018-12-03 DIAGNOSIS — K219 Gastro-esophageal reflux disease without esophagitis: Secondary | ICD-10-CM | POA: Diagnosis not present

## 2018-12-03 DIAGNOSIS — I2510 Atherosclerotic heart disease of native coronary artery without angina pectoris: Secondary | ICD-10-CM | POA: Diagnosis not present

## 2018-12-03 DIAGNOSIS — I4891 Unspecified atrial fibrillation: Secondary | ICD-10-CM | POA: Diagnosis not present

## 2019-01-01 ENCOUNTER — Encounter: Payer: Medicare Other | Admitting: Family Medicine

## 2019-01-01 ENCOUNTER — Telehealth: Payer: Self-pay

## 2019-01-01 NOTE — Telephone Encounter (Signed)
Noted  

## 2019-01-01 NOTE — Telephone Encounter (Signed)
Copied from [place] [id number]. Topic: General - Other >> Jan 01, 2019 10:19 AM Leward Quan A wrote: Reason for CRM: Patient called in had an appointment scheduled for 10.40 AM but due to sister having some issues she had to cancel. Sister was with patient 3 days prior but woke with fever and cough this morning. As a precaution patient rescheduled her appointment.

## 2019-02-25 ENCOUNTER — Telehealth: Payer: Self-pay | Admitting: Family Medicine

## 2019-02-25 NOTE — Telephone Encounter (Signed)
I left a message asking the patient to call and schedule Medicare AWV with Courtney ([facility]).  If patient calls back, please schedule Medicare Wellness Visit (initial) at next available opening.  VDM (Dee-Dee) 

## 2019-03-25 ENCOUNTER — Other Ambulatory Visit: Payer: Self-pay

## 2019-03-25 ENCOUNTER — Telehealth: Payer: Self-pay | Admitting: Family Medicine

## 2019-03-25 NOTE — Telephone Encounter (Signed)
Patient called in wondering if she can get in sooner appt than 04/22/19, she states that she has been having extreme R arm pain, she thinks she may have pulled a muscle in the arm, and she can barley lift it up.

## 2019-03-25 NOTE — Telephone Encounter (Signed)
That should be fine. 

## 2019-03-26 ENCOUNTER — Ambulatory Visit (INDEPENDENT_AMBULATORY_CARE_PROVIDER_SITE_OTHER): Payer: Medicare Other | Admitting: Family Medicine

## 2019-03-26 ENCOUNTER — Encounter: Payer: Self-pay | Admitting: Family Medicine

## 2019-03-26 VITALS — BP 128/78 | HR 70 | Temp 96.2°F | Ht 62.0 in | Wt 152.4 lb

## 2019-03-26 DIAGNOSIS — K225 Diverticulum of esophagus, acquired: Secondary | ICD-10-CM

## 2019-03-26 DIAGNOSIS — F411 Generalized anxiety disorder: Secondary | ICD-10-CM

## 2019-03-26 DIAGNOSIS — Z7901 Long term (current) use of anticoagulants: Secondary | ICD-10-CM

## 2019-03-26 DIAGNOSIS — M25561 Pain in right knee: Secondary | ICD-10-CM

## 2019-03-26 DIAGNOSIS — G8929 Other chronic pain: Secondary | ICD-10-CM

## 2019-03-26 DIAGNOSIS — F339 Major depressive disorder, recurrent, unspecified: Secondary | ICD-10-CM

## 2019-03-26 DIAGNOSIS — M75121 Complete rotator cuff tear or rupture of right shoulder, not specified as traumatic: Secondary | ICD-10-CM | POA: Diagnosis not present

## 2019-03-26 DIAGNOSIS — M23306 Other meniscus derangements, unspecified meniscus, right knee: Secondary | ICD-10-CM | POA: Diagnosis not present

## 2019-03-26 MED ORDER — TRIAMCINOLONE ACETONIDE 40 MG/ML IJ SUSP
40.0000 mg | Freq: Once | INTRAMUSCULAR | Status: AC
  Administered 2019-03-26: 16:00:00 40 mg via INTRAMUSCULAR

## 2019-03-26 MED ORDER — OMEPRAZOLE 40 MG PO CPDR: 40.0000 mg | DELAYED_RELEASE_CAPSULE | Freq: Every day | ORAL | 3 refills | Status: DC

## 2019-03-26 MED ORDER — ALPRAZOLAM 0.5 MG PO TABS: 0.5000 mg | ORAL_TABLET | Freq: Every day | ORAL | 0 refills | Status: DC | PRN

## 2019-03-26 NOTE — Progress Notes (Signed)
Subjective  CC:  Chief Complaint  Patient presents with  . Shoulder Pain    HPI: Brittney Cochran is a 79 y.o. female who presents to the office today to address the problems listed above in the chief complaint.  Brittney Cochran reports that about 3 to 4 weeks ago she was lifting a large bin full of supplies over something in her garage, up over her shoulder, and felt acute onset of pain.  Since then pain has increased, persisted and worse with lying on that side and now has trouble lifting up her arm unassisted.  She is able to flex at the elbow and her normal grip is present.  She does have chronic right grip weakness due to history of CVA.  She does use over-the-counter medications for pain relief but activities are extremely limited due to decreased likely trauma and due to worsening pain.  She denies neck pain or radicular symptoms.  She has had a "bursitis" diet in the past.  Chronic right knee pain with history of osteoarthritis and reported chronic meniscal tear status post left steroid injection in September.  She had 1 prior steroid injection in March.  She had relief of pain after both steroid injections however cannot.  She uses over-the-counter Voltaren gel which does relieve pain.  She does have intermittent swelling.  No redness or warmth in the joint.  No fevers.  No new injuries.  General anxiety disorder major depressive disorder well-controlled on high-dose Zoloft.  She requested a refill of her Xanax.  She uses very low-dose and intermittent basis.  She has been pills that were last refilled a year ago.  Tender diverticulum and GERD: Needs refill of her omeprazole.  She has been out for over a month and is starting to have reflux symptoms again.  Of note, she is no longer on anticoagulation therapy.  Cardiology finally stopped it after having her on her monitor which did not reveal any further episodes of PAF.  She has been in sinus rhythm since her ablation.  She is high risk for  anticoagulation given her history of hemorrhagic CVA.  Assessment  1. Nontraumatic complete tear of right rotator cuff   2. Chronic pain of right knee   3. Degenerative tear of meniscus of right knee   4. Generalized anxiety disorder   5. Major depression, recurrent, chronic (HCC)   6. Zenker diverticulum   7. Long term current use of anticoagulant therapy      Plan   Shoulder pain, right: Exam consistent with complete rotator cuff tear.  Status post her injection today.  Routine postprocedure care instructions given.  Refer to sports medicine for further evaluation.  May need further imaging.  Right chronic knee pain with history of meniscus tear and osteoarthritis status post 2 steroid injections in the last year.  Refer to sports medicine for further evaluation and treatment.  Continue topical NSAIDs for now.  Anxiety and depression are controlled.  Refilled Xanax for rare and intermittent use  GERD: Restart omeprazole 40 mg daily.  Patient will follow up with GI to see if she can tolerate a low dose.  Recheck vitamin B12 levels at next visit   Follow up: Return for as scheduled.  04/22/2019  Orders Placed This Encounter  Procedures  . Ambulatory referral to Sports Medicine   Meds ordered this encounter  Medications  . triamcinolone acetonide (KENALOG-40) injection 40 mg  . ALPRAZolam (XANAX) 0.5 MG tablet    Sig: Take 1 tablet (  0.5 mg total) by mouth daily as needed.    Dispense:  30 tablet    Refill:  0  . omeprazole (PRILOSEC) 40 MG capsule    Sig: Take 1 capsule (40 mg total) by mouth daily.    Dispense:  90 capsule    Refill:  3      I reviewed the patients updated PMH, FH, and SocHx.    Patient Active Problem List   Diagnosis Date Noted  . Hemorrhagic cerebrovascular accident (CVA) ([place]) 04/20/2018    Priority: High  . Major depression, recurrent, chronic ([place]) 02/12/2015    Priority: High  . Coronary artery disease involving native coronary artery of native  heart without angina pectoris 05/16/2012    Priority: High  . Generalized anxiety disorder 01/10/2012    Priority: High  . History of non-ST elevation myocardial infarction (NSTEMI) 12/05/2011    Priority: High  . Mixed hyperlipidemia 12/31/2010    Priority: High  . Paroxysmal atrial fibrillation ([place]) 12/14/2010    Priority: High  . Essential hypertension 01/09/2009    Priority: High  . Subclinical hypothyroidism 03/24/2017    Priority: Medium  . DJD (degenerative joint disease), cervical 05/12/2016    Priority: Medium  . Foraminal stenosis of cervical region 08/18/2015    Priority: Medium  . Chronic pain of both knees 02/12/2015    Priority: Medium  . Zenker diverticulum 05/08/2014    Priority: Medium  . Osteopenia 06/08/2012    Priority: Medium  . Gastro-esophageal reflux disease without esophagitis 08/12/2011    Priority: Medium  . Osteoarthritis, hand 12/31/2010    Priority: Low  . Rosacea 12/31/2010    Priority: Low   Current Meds  Medication Sig  . ALPRAZolam (XANAX) 0.5 MG tablet Take 1 tablet (0.5 mg total) by mouth daily as needed.  . carvedilol (COREG) 12.5 MG tablet TK 1 T PO BID WITH MEALS  . Co-Enzyme Q-10 30 MG CAPS Take by mouth.  . diclofenac sodium (VOLTAREN) 1 % GEL Apply 2 g topically 4 (four) times daily. To hands as needed  . fluorouracil (EFUDEX) 5 % cream Apply topically 3 (three) times daily.  . fluticasone (FLONASE) 50 MCG/ACT nasal spray USE 1 SPRAY NASALLY DAILY  . lisinopril (PRINIVIL,ZESTRIL) 2.5 MG tablet Take 2.5 mg by mouth daily.   . nitroGLYCERIN (NITROSTAT) 0.4 MG SL tablet Place 0.4 mg under the tongue.  Marland Kitchen omeprazole (PRILOSEC) 40 MG capsule Take 1 capsule (40 mg total) by mouth daily.  . ranolazine (RANEXA) 500 MG 12 hr tablet Take 500 mg by mouth 2 (two) times daily.   . rosuvastatin (CRESTOR) 40 MG tablet Take 40 mg by mouth daily.   . sertraline (ZOLOFT) 100 MG tablet Take 1.5 tablets (150 mg total) by mouth daily.  Marland Kitchen SYNTHROID 25  MCG tablet TAKE 1 TABLET BY MOUTH DAILY BEFORE BREAKFAST  . VITAMIN D, CHOLECALCIFEROL, PO Take by mouth.  . [DISCONTINUED] ALPRAZolam (XANAX) 0.5 MG tablet TAKE 1 TABLET BY MOUTH DAILY AS NEEDED  . [DISCONTINUED] omeprazole (PRILOSEC) 40 MG capsule Take 40 mg by mouth daily.     Allergies: Patient has No Known Allergies. Family History: Patient family history includes Alcohol abuse in her father; Atrial fibrillation in her mother; Diabetes in her brother, father, and paternal grandmother; Early death in her father and maternal grandfather; Heart attack in her father, maternal uncle, and paternal grandmother; Kidney disease in her maternal grandmother; Stroke in her mother, paternal aunt, and son; Transient ischemic attack in her mother.  Social History:  Patient  reports that she has never smoked. She has never used smokeless tobacco. She reports current alcohol use of about 2.0 - 3.0 standard drinks of alcohol per week. She reports that she does not use drugs.  Review of Systems: Constitutional: Negative for fever malaise or anorexia Cardiovascular: negative for chest pain Respiratory: negative for SOB or persistent cough Gastrointestinal: negative for abdominal pain  Objective  Vitals: BP 128/78 (BP Location: Left Arm, Patient Position: Sitting, Cuff Size: Normal)   Pulse 70   Temp (!) 96.2 F (35.7 C) (Temporal)   Ht 5\' 2"  (1.575 m)   Wt 152 lb 6.4 oz (69.1 kg)   SpO2 93%   BMI 27.87 kg/m  General: no acute distress , A&Ox3 Right shoulder: Full passive range of motion active abduction limited as an empty can testing, positive drop testing tender posterior and anterior tenderness  Right knee: Mild effusion, full range of motion, mild crepitus. skin:  Warm, no rashes  Shoulder Injection Procedure Note  Pre-operative Diagnosis: right rotator cuff tear  Post-operative Diagnosis: same  Indications: Symptomatic relief and failed conservative measures  Anesthesia: Lidocaine 1%  without epinephrine without added sodium bicarbonate  Procedure Details   Verbal consent was obtained for the procedure. The shoulder was prepped with alcohol  and the skin was anesthetized with cold spray. Using a 22 gauge needle the subacromial space was injected with 4 mL 1% lidocaine and 1 mL of triamcinolone (KENALOG) 40mg /ml under the posterior aspect of the acromion. The injection site was cleansed with topical isopropyl alcohol and a dressing was applied.  Complications:  None; patient tolerated the procedure well.    Commons side effects, risks, benefits, and alternatives for medications and treatment plan prescribed today were discussed, and the patient expressed understanding of the given instructions. Patient is instructed to call or message via MyChart if he/she has any questions or concerns regarding our treatment plan. No barriers to understanding were identified. We discussed Red Flag symptoms and signs in detail. Patient expressed understanding regarding what to do in case of urgent or emergency type symptoms.   Medication list was reconciled, printed and provided to the patient in AVS. Patient instructions and summary information was reviewed with the patient as documented in the AVS. This note was prepared with assistance of Dragon voice recognition software. Occasional wrong-word or sound-a-like substitutions may have occurred due to the inherent limitations of voice recognition software  This visit occurred during the SARS-CoV-2 public health emergency.  Safety protocols were in place, including screening questions prior to the visit, additional usage of staff PPE, and extensive cleaning of exam room while observing appropriate contact time as indicated for disinfecting solutions.

## 2019-03-26 NOTE — Patient Instructions (Signed)
Please follow up as scheduled for your next visit with me: 04/22/2019   I am sending your to Dr. Junius Roads to evaluate your shoulder and knee pain. He can decide on MRI of one or both and then see if he can help you or if you will need to see a Psychologist, sport and exercise.   If you have any questions or concerns, please don't hesitate to send me a message via MyChart or call the office at [phone]. Thank you for visiting with Brittney Cochran today! It's our pleasure caring for you.  You had a steroid injection today.   Things to be aware of after this injection are listed below:  You may experience no significant improvement or even a slight worsening in your symptoms during the first 24 to 48 hours.  After that we expect your symptoms to improve gradually over the next 2 weeks for the medicine to have its maximal effect.  You should continue to have improvement out to 6 weeks after your injection.  I recommend icing the site of the injection for 20 minutes  1-2 times the day of your injection  You may shower but no swimming, tub bath or Jacuzzi for 24 hours.  If your bandage falls off this does not need to be replaced.  It is appropriate to remove the bandage after 4 hours.  You may resume light activities as tolerated.     POSSIBLE PROCEDURE SIDE EFFECTS: The side effects of the injection are usually fairly minimal however if you may experience some of the following side effects that are usually self-limited and will is off on their own.  If you are concerned please feel free to call the office with questions:             Increased numbness or tingling             Nausea or vomiting             Swelling or bruising at the injection site    Please call our office if if you experience any of the following symptoms over the next week as these can be signs of infection:              Fever greater than 100.23F             Significant swelling at the injection site             Significant redness or drainage from the  injection site     Rotator Cuff Tear  A rotator cuff tear is a partial or complete tear of the cord-like bands (tendons) that connect muscle to bone in the rotator cuff. The rotator cuff is a group of muscles and tendons that surround the shoulder joint and keep the upper arm bone (humerus) in the shoulder socket. The tear can occur suddenly (acute tear) or can develop over a long period of time (chronic tear). What are the causes? Acute tears may be caused by:  A fall, especially on an outstretched arm.  Lifting very heavy objects with a jerking motion. Chronic tears may be caused by overuse of the muscles. This may happen in sports, physical work, or activities in which your arm repeatedly moves over your head. What increases the risk? This condition is more likely to occur in:  Athletes and workers who frequently use their shoulder or reach over their heads. This may include activities such as: ? Tennis. ? Baseball and softball. ? Swimming and rowing. ? Weightlifting. ?  Construction work. ? Painting.  People who smoke.  Older people who have arthritis or poor blood supply. These can make the muscles and tendons weaker. What are the signs or symptoms? Symptoms of this condition depend on the type and severity of the injury:  An acute tear may include a sudden tearing feeling, followed by severe pain that goes from your upper shoulder, down your arm, and toward your elbow.  A chronic tear includes a gradual weakness and decreased shoulder motion as the pain gets worse. The pain is usually worse at night. Both types may have symptoms such as:  Pain that spreads (radiates) from the shoulder to the upper arm.  Swelling and tenderness in front of the shoulder.  Decreased range of motion.  Pain when: ? Reaching, pulling, or lifting the arm above the head. ? Lowering the arm from above the head.  Not being able to raise your arm out to the side.  Difficulty placing the arm  behind your back. How is this diagnosed? This condition is diagnosed with a medical history and physical exam. Imaging tests may also be done, including:  X-rays.  MRI.  Ultrasound.  CT or MR arthrogram. During this test, a contrast material is injected into your shoulder and then images are taken. How is this treated? Treatment for this condition depends on the type and severity of the condition. In less severe cases, treatment may include:  Rest. This may be done with a sling that holds the shoulder still (immobilization). Your health care provider may also recommend avoiding activities that involve lifting your arm over your head.  Icing the shoulder.  Anti-inflammatory medicines, such as aspirin or ibuprofen.  Strengthening and stretching exercises. Your health care provider may recommend specific exercises to improve your range of motion and strengthen your shoulder. In more severe cases, treatment may include:  Physical therapy.  Steroid injections.  Surgery. Follow these instructions at home: Managing pain, stiffness, and swelling  If directed, put ice on the injured area. ? If you have a removable sling, remove it as told by your health care provider. ? Put ice in a plastic bag. ? Place a towel between your skin and the bag. ? Leave the ice on for 20 minutes, 2-3 times a day.  Raise (elevate) the injured area above the level of your heart while you are lying down.  Find a comfortable sleeping position or sleep on a recliner, if available.  Move your fingers often to avoid stiffness and to lessen swelling.  Once the swelling has gone down, your health care provider may direct you to apply heat to relax the muscles. Use the heat source that your health care provider recommends, such as a moist heat pack or a heating pad. ? Place a towel between your skin and the heat source. ? Leave the heat on for 20-30 minutes. ? Remove the heat if your skin turns bright red.  This is especially important if you are unable to feel pain, heat, or cold. You may have a greater risk of getting burned. If you have a sling:  Wear the sling as told by your health care provider. Remove it only as told by your health care provider.  Loosen the sling if your fingers tingle, become numb, or turn cold and blue.  Keep the sling clean.  If the sling is not waterproof: ? Do not let it get wet. ? Cover it with a watertight covering when you take a bath or a  shower. Driving  Do not drive or use heavy machinery while taking prescription pain medicine.  Ask your health care provider when it is safe to drive if you have a sling on your arm. Activity  Rest your shoulder as told by your health care provider.  Return to your normal activities as told by your health care provider. Ask your health care provider what activities are safe for you.  Do any exercises or stretches as told by your health care provider. General instructions  Do not use any products that contain nicotine or tobacco, such as cigarettes and e-cigarettes. If you need help quitting, ask your health care provider.  Take over-the-counter and prescription medicines only as told by your health care provider.  Keep all follow-up visits as told by your health care provider. This is important. Contact a health care provider if:  Your pain gets worse.  You have new pain in your arm, hands, or fingers.  Medicine does not help your pain. Get help right away if:  Your arm, hand, or fingers are numb or tingling.  Your arm, hand, or fingers are swollen or painful or they turn white or blue.  Your hand or fingers on your injured arm are colder than your other hand. Summary  A rotator cuff tear is a partial or complete tear of the cord-like bands (tendons) that connect muscle to bone in the rotator cuff.  The tear can occur suddenly (acute tear) or can develop over a long period of time (chronic  tear).  Treatment generally includes rest, anti-inflammatory medicines, and icing. In some cases, physical therapy and steroid injections may be needed. In severe cases, surgery may be needed. This information is not intended to replace advice given to you by your health care provider. Make sure you discuss any questions you have with your health care provider. Document Revised: 02/10/2017 Document Reviewed: 05/16/2016 Elsevier Patient Education  [place].

## 2019-03-29 ENCOUNTER — Ambulatory Visit: Payer: Medicare Other | Admitting: Family Medicine

## 2019-03-29 ENCOUNTER — Ambulatory Visit: Payer: Self-pay

## 2019-03-29 ENCOUNTER — Other Ambulatory Visit: Payer: Self-pay

## 2019-03-29 ENCOUNTER — Encounter: Payer: Self-pay | Admitting: Family Medicine

## 2019-03-29 DIAGNOSIS — M25511 Pain in right shoulder: Secondary | ICD-10-CM

## 2019-03-29 DIAGNOSIS — G8929 Other chronic pain: Secondary | ICD-10-CM

## 2019-03-29 DIAGNOSIS — M25561 Pain in right knee: Secondary | ICD-10-CM | POA: Diagnosis not present

## 2019-03-29 DIAGNOSIS — M75121 Complete rotator cuff tear or rupture of right shoulder, not specified as traumatic: Secondary | ICD-10-CM | POA: Diagnosis not present

## 2019-03-29 NOTE — Progress Notes (Signed)
Office Visit Note   Patient: Brittney Cochran           Date of Birth: [birth year]           MRN: [id number] Visit Date: 03/29/2019 Requested by: Leamon Arnt, [place],  [place] [id number] PCP: Leamon Arnt, MD  Subjective: Chief Complaint  Patient presents with  . Right Shoulder - Pain    Jerked and lifted a large container 12/26 or 27th. Pain has worsened. Can no longer lift her arm.  . Right Knee - Pain    Chronic pain right knee. S/p a "bad fall" 02/2018.     HPI: She is here at the request of Dr. Jonni Sanger for right shoulder pain.  Around December 26 she was lifting a plastic bin, attempted to put it up on a shelf and felt something pop in her right shoulder.  Immediate pain but she kept on with her activities.  Over the next few days she noticed that in addition to the pain, she could no longer raise her arm overhead.  She went to Dr. Jonni Sanger who gave her a subacromial injection which has taken the edge off the pain, but unfortunately she still cannot reach overhead.  She is right-hand dominant.  No previous problems with her shoulder.  She has a history of coronary artery disease which is stable.  She has paroxysmal atrial fibrillation and history of a hemorrhagic stroke after a head trauma a couple years ago.  All of her medical conditions are currently stable.               ROS: No fever or chills.  All other systems were reviewed and are negative.  Objective: Vital Signs: There were no vitals taken for this visit.  Physical Exam:  General:  Alert and oriented, in no acute distress. Pulm:  Breathing unlabored. Psy:  Normal mood, congruent affect. Skin: No bruising. Right shoulder: Unable to abductor shoulder actively.  Passively she has full range of motion.  She has good strength with internal rotation although it causes a little bit of pain.  She has slight weakness with external rotation.  Positive drop test for supraspinatus tear.  No tenderness of the  AC joint.  Imaging: Diagnostic ultrasound right shoulder: Long head biceps tendon appears to be in its groove and does not look torn.  Subscapularis tendon appears intact.  Supraspinatus has a high-grade tear involving probably two thirds of the tendon with retraction.  Infraspinatus looks about 50% torn.  Assessment & Plan: 1.  Right shoulder supraspinatus and infraspinatus tears -Discussed options with patient, she would very much prefer to have surgical repair if indicated.  We will make an appointment with Dr. Marlou Sa for this coming Monday.  I told her he might order x-rays and possibly MRI scan to further evaluate unless clinical exam is sufficient.     Procedures: No procedures performed  No notes on file     PMFS History: Patient Active Problem List   Diagnosis Date Noted  . Hemorrhagic cerebrovascular accident (CVA) ([place]) 04/20/2018  . Subclinical hypothyroidism 03/24/2017  . DJD (degenerative joint disease), cervical 05/12/2016  . Foraminal stenosis of cervical region 08/18/2015  . Chronic pain of both knees 02/12/2015  . Major depression, recurrent, chronic ([place]) 02/12/2015  . Zenker diverticulum 05/08/2014  . Osteopenia 06/08/2012  . Coronary artery disease involving native coronary artery of native heart without angina pectoris 05/16/2012  . Generalized anxiety disorder 01/10/2012  .  History of non-ST elevation myocardial infarction (NSTEMI) 12/05/2011  . Gastro-esophageal reflux disease without esophagitis 08/12/2011  . Mixed hyperlipidemia 12/31/2010  . Osteoarthritis, hand 12/31/2010  . Rosacea 12/31/2010  . Paroxysmal atrial fibrillation ([place]) 12/14/2010  . Essential hypertension 01/09/2009   Past Medical History:  Diagnosis Date  . Atrial fibrillation ([place])   . Coronary artery disease   . GERD (gastroesophageal reflux disease)    endoscopy and esophageal diatation 2013 Dr. Percell Miller  . Hyperlipemia   . Leiomyoma of body of uterus   . Long term current use of  anticoagulant therapy 07/07/2015   Stopped 2020; no recurrence of PAF since ablation. Stopped due to hemorrhagic cva.   . Osteopenia   . Stroke ([place])   . Subclinical hypothyroidism 03/24/2017   Started synthroid 25 03/2017 for increasing TSH.    Family History  Problem Relation Age of Onset  . Atrial fibrillation Mother   . Transient ischemic attack Mother   . Stroke Mother   . Alcohol abuse Father   . Diabetes Father   . Early death Father   . Heart attack Father   . Diabetes Brother   . Kidney disease Maternal Grandmother   . Early death Maternal Grandfather   . Diabetes Paternal Grandmother   . Heart attack Paternal Grandmother   . Heart attack Maternal Uncle   . Stroke Son   . Stroke Paternal Aunt     Past Surgical History:  Procedure Laterality Date  . ABLATION OF DYSRHYTHMIC FOCUS    . BLADDER REPAIR  2011  . BREAST BIOPSY Left 08/19/2013  . CAROTID STENT  12/04/2011  . ESOPHAGEAL DILATION  2013  . Floating Kidney  1963  . TUBAL LIGATION  1985   Social History   Occupational History  . Not on file  Tobacco Use  . Smoking status: Never Smoker  . Smokeless tobacco: Never Used  Substance and Sexual Activity  . Alcohol use: Yes    Alcohol/week: 2.0 - 3.0 standard drinks    Types: 2 - 3 Glasses of wine per week    Comment: Red Wine  . Drug use: No  . Sexual activity: Never

## 2019-04-01 ENCOUNTER — Ambulatory Visit: Payer: Self-pay

## 2019-04-01 ENCOUNTER — Other Ambulatory Visit: Payer: Self-pay

## 2019-04-01 ENCOUNTER — Ambulatory Visit: Payer: Medicare Other | Admitting: Orthopedic Surgery

## 2019-04-01 DIAGNOSIS — M75101 Unspecified rotator cuff tear or rupture of right shoulder, not specified as traumatic: Secondary | ICD-10-CM | POA: Diagnosis not present

## 2019-04-01 DIAGNOSIS — M12811 Other specific arthropathies, not elsewhere classified, right shoulder: Secondary | ICD-10-CM | POA: Diagnosis not present

## 2019-04-02 ENCOUNTER — Ambulatory Visit: Payer: Medicare Other

## 2019-04-04 ENCOUNTER — Telehealth: Payer: Self-pay | Admitting: Orthopedic Surgery

## 2019-04-04 NOTE — Telephone Encounter (Signed)
Called patient left message on voicemail to return call to schedule an appointment with DR dean for MRI review    MRI scheduled 04/06/2019

## 2019-04-05 ENCOUNTER — Encounter: Payer: Self-pay | Admitting: Orthopedic Surgery

## 2019-04-05 NOTE — Progress Notes (Signed)
Office Visit Note   Patient: Brittney Cochran           Date of Birth: 1941-01-08           MRN: [id number] Visit Date: 04/01/2019 Requested by: Leamon Arnt, [place],  [place] [address] PCP: Leamon Arnt, MD  Subjective: Chief Complaint  Patient presents with  . Right Shoulder - Pain    HPI: Brittney Cochran is a 79 y.o. female who presents to the office complaining of right shoulder dysfunction.  Patient notes that she injured her right shoulder while lifting a container while decorating for Christmas.  She was seen by her primary care physician who referred patient to Dr. Junius Roads.  Dr. Junius Roads did an ultrasound that revealed rotator cuff tear.  She denies any complaint of pain, mostly the lack of function is her concern.  She has had a history of right shoulder pain over the past 2 to 3 months but has no history of shoulder surgery.  Denies any neck symptoms, numbness/tingling, weakness elsewhere throughout the upper extremities.  Does not wake with pain.  No prior surgery or injury.  He does have a history of MI with stents and history of atrial fibrillation with ablation.  Denies any history of smoking, diabetes, blood clots.  She was on Coumadin until December 2019..                ROS:  All systems reviewed are negative as they relate to the chief complaint within the history of present illness.  Patient denies fevers or chills.  Assessment & Plan: Visit Diagnoses:  1. Rotator cuff tear arthropathy of right shoulder     Plan: Patient is a 79 year old female who presents complaining of right shoulder dysfunction.  This is following an acute injury when she was lifting a container and felt pain with sudden dysfunction of the arm.  She has active forward flexion to 45 degrees and active abduction to 75 degrees.  She has been seen by Dr. Junius Roads who did an ultrasound that revealed rotator cuff tear.  She has significant weakness with infraspinatus and supraspinatus on  exam.  Ordered right shoulder MRI to evaluate for rotator cuff tear.  If the rotator cuff tendon tear looks repairable and is not significantly retracted, we will plan on right shoulder rotator cuff repair.  However before this can happen she will need cardiac clearance from her cardiologist due to her medical history.  Patient agrees with plan and will follow up after MRI to review results.  Patient is interested in surgical management but not necessarily shoulder replacement.  Follow-Up Instructions: No follow-ups on file.   Orders:  Orders Placed This Encounter  Procedures  . XR Shoulder Right  . MR SHOULDER RIGHT WO CONTRAST   No orders of the defined types were placed in this encounter.     Procedures: No procedures performed   Clinical Data: No additional findings.  Objective: Vital Signs: There were no vitals taken for this visit.  Physical Exam:  Constitutional: Patient appears well-developed HEENT:  Head: Normocephalic Eyes:EOM are normal Neck: Normal range of motion Cardiovascular: Normal rate Pulmonary/chest: Effort normal Neurologic: Patient is alert Skin: Skin is warm Psychiatric: Patient has normal mood and affect  Ortho Exam:  Right shoulder Exam Active forward flexion to 45 degrees and active abduction to 75 degrees No loss of ER relative to the other shoulder.  Good endpoint with ER No TTP over the [facility]  joint or bicipital groove 5/5 motor strength of the subscapularis muscle.  Marked weakness of the infraspinatus and supraspinatus muscles Negative Hawkins impingement 5/5 grip strength, forearm pronation/supination, and bicep strength  Specialty Comments:  No specialty comments available.  Imaging: No results found.   PMFS History: Patient Active Problem List   Diagnosis Date Noted  . Hemorrhagic cerebrovascular accident (CVA) ([place]) 04/20/2018  . Subclinical hypothyroidism 03/24/2017  . DJD (degenerative joint disease), cervical 05/12/2016  .  Foraminal stenosis of cervical region 08/18/2015  . Chronic pain of both knees 02/12/2015  . Major depression, recurrent, chronic ([place]) 02/12/2015  . Zenker diverticulum 05/08/2014  . Osteopenia 06/08/2012  . Coronary artery disease involving native coronary artery of native heart without angina pectoris 05/16/2012  . Generalized anxiety disorder 01/10/2012  . History of non-ST elevation myocardial infarction (NSTEMI) 12/05/2011  . Gastro-esophageal reflux disease without esophagitis 08/12/2011  . Mixed hyperlipidemia 12/31/2010  . Osteoarthritis, hand 12/31/2010  . Rosacea 12/31/2010  . Paroxysmal atrial fibrillation ([place]) 12/14/2010  . Essential hypertension 01/09/2009   Past Medical History:  Diagnosis Date  . Atrial fibrillation ([place])   . Coronary artery disease   . GERD (gastroesophageal reflux disease)    endoscopy and esophageal diatation 2013 Dr. Percell Miller  . Hyperlipemia   . Leiomyoma of body of uterus   . Long term current use of anticoagulant therapy 07/07/2015   Stopped 2020; no recurrence of PAF since ablation. Stopped due to hemorrhagic cva.   . Osteopenia   . Stroke ([place])   . Subclinical hypothyroidism 03/24/2017   Started synthroid 25 03/2017 for increasing TSH.    Family History  Problem Relation Age of Onset  . Atrial fibrillation Mother   . Transient ischemic attack Mother   . Stroke Mother   . Alcohol abuse Father   . Diabetes Father   . Early death Father   . Heart attack Father   . Diabetes Brother   . Kidney disease Maternal Grandmother   . Early death Maternal Grandfather   . Diabetes Paternal Grandmother   . Heart attack Paternal Grandmother   . Heart attack Maternal Uncle   . Stroke Son   . Stroke Paternal Aunt     Past Surgical History:  Procedure Laterality Date  . ABLATION OF DYSRHYTHMIC FOCUS    . BLADDER REPAIR  2011  . BREAST BIOPSY Left 08/19/2013  . CAROTID STENT  12/04/2011  . ESOPHAGEAL DILATION  2013  . Floating Kidney  1963  .  TUBAL LIGATION  1985   Social History   Occupational History  . Not on file  Tobacco Use  . Smoking status: Never Smoker  . Smokeless tobacco: Never Used  Substance and Sexual Activity  . Alcohol use: Yes    Alcohol/week: 2.0 - 3.0 standard drinks    Types: 2 - 3 Glasses of wine per week    Comment: Red Wine  . Drug use: No  . Sexual activity: Never

## 2019-04-06 ENCOUNTER — Ambulatory Visit (HOSPITAL_BASED_OUTPATIENT_CLINIC_OR_DEPARTMENT_OTHER): Payer: Medicare Other

## 2019-04-09 ENCOUNTER — Telehealth: Payer: Self-pay | Admitting: Family Medicine

## 2019-04-09 NOTE — Telephone Encounter (Signed)
Called patient left voicemail message to return call to schedule an appointment for MRI review with Dr. Junius Roads

## 2019-04-13 ENCOUNTER — Ambulatory Visit (HOSPITAL_BASED_OUTPATIENT_CLINIC_OR_DEPARTMENT_OTHER): Payer: Medicare Other

## 2019-04-17 ENCOUNTER — Ambulatory Visit: Payer: Medicare Other | Admitting: Orthopedic Surgery

## 2019-04-20 ENCOUNTER — Ambulatory Visit (HOSPITAL_BASED_OUTPATIENT_CLINIC_OR_DEPARTMENT_OTHER)
Admission: RE | Admit: 2019-04-20 | Discharge: 2019-04-20 | Disposition: A | Discharge status: Home / Self Care | Payer: Medicare Other | Source: Ambulatory Visit | Attending: Orthopedic Surgery | Admitting: Orthopedic Surgery

## 2019-04-20 ENCOUNTER — Other Ambulatory Visit: Payer: Self-pay

## 2019-04-20 DIAGNOSIS — M25511 Pain in right shoulder: Secondary | ICD-10-CM | POA: Diagnosis not present

## 2019-04-20 DIAGNOSIS — M75101 Unspecified rotator cuff tear or rupture of right shoulder, not specified as traumatic: Secondary | ICD-10-CM | POA: Insufficient documentation

## 2019-04-20 DIAGNOSIS — M12811 Other specific arthropathies, not elsewhere classified, right shoulder: Secondary | ICD-10-CM | POA: Diagnosis not present

## 2019-04-22 ENCOUNTER — Encounter: Payer: Medicare Other | Admitting: Family Medicine

## 2019-04-24 ENCOUNTER — Ambulatory Visit: Payer: Medicare Other | Admitting: Orthopedic Surgery

## 2019-05-01 ENCOUNTER — Ambulatory Visit: Payer: Medicare Other | Admitting: Orthopedic Surgery

## 2019-05-02 ENCOUNTER — Ambulatory Visit: Payer: Medicare Other | Admitting: Orthopedic Surgery

## 2019-05-09 ENCOUNTER — Other Ambulatory Visit: Payer: Self-pay

## 2019-05-09 ENCOUNTER — Ambulatory Visit: Payer: Medicare Other | Admitting: Orthopedic Surgery

## 2019-05-09 DIAGNOSIS — M12811 Other specific arthropathies, not elsewhere classified, right shoulder: Secondary | ICD-10-CM

## 2019-05-09 DIAGNOSIS — M75101 Unspecified rotator cuff tear or rupture of right shoulder, not specified as traumatic: Secondary | ICD-10-CM | POA: Diagnosis not present

## 2019-05-10 ENCOUNTER — Encounter: Payer: Self-pay | Admitting: Orthopedic Surgery

## 2019-05-10 NOTE — Progress Notes (Signed)
Office Visit Note   Patient: Brittney Cochran           Date of Birth: 10-Mar-1941           MRN: [id number] Visit Date: 05/09/2019 Requested by: Leamon Arnt, [place],  [place] [address] PCP: Leamon Arnt, MD  Subjective: Chief Complaint  Patient presents with  . Follow-up    MRI review    HPI: Brittney Cochran is a 79 y.o. female who presents to the office for MRI review of right shoulder.  Patient notes improved function of the shoulder but she still has significant dysfunction with active range of motion.  She has continued pain with occasional radiation of the right shoulder pain to the right elbow.  Pain wakes her up at night.  She has been taking occasional ibuprofen with some relief of her pain.  It hurts to drive when she tries to drive a car due to her right shoulder pain.  Additionally, she is complaining of right knee pain.  She has had knee radiographs taken by her primary care physician (Dr. Jonni Sanger) which showed tricompartmental degenerative changes of the right knee..  She localizes the majority of her pain to the medial aspect.  She denies any mechanical symptoms but admits to instability of the right knee.  She has had multiple injections in the last 1 was about a year ago.  She denies any groin pain, radicular symptoms, low back pain.              ROS:  All systems reviewed are negative as they relate to the chief complaint within the history of present illness.  Patient denies fevers or chills.  Assessment & Plan: Visit Diagnoses:  1. Rotator cuff tear arthropathy of right shoulder     Plan: Patient is a 79 year old female who presents complaining of right shoulder pain.  She is here for MRI review of the right shoulder.  MRI of the right shoulder revealed complete supraspinatus and infraspinatus tendon tears with 4-1/2 to 5-1/2 cm of tendon retraction as well as mild to moderate supraspinatus/infraspinatus atrophy.  Discussed options available  to patient.  Impression is that these rotator cuff tears are not amenable to repair due to the nature of how far they were retracted.  This is causing her significant dysfunction and pain.  She has less than 90 degrees of forward flexion and abduction.  After lengthy discussion including surgical management, patient wants to hold off on right shoulder surgery for now but will consider surgery in the form of right reverse shoulder arthroplasty in the future.  She will follow-up in 4 weeks for a right shoulder injection.  In general the patient does have some pain and loss of function but it does not raise to the level of requiring surgical intervention yet.  Additionally regarding the patient's right knee pain, she has no significant effusion and no mechanical symptoms but is experiencing medial sided knee pain with a history of right knee arthritis.  Her last injection was a year ago.  Discussed options available to patient and decided on a course of action including right knee injection today.  Patient tolerated the procedure well and will follow up with the office in 4 weeks.  Follow-Up Instructions: No follow-ups on file.   Orders:  No orders of the defined types were placed in this encounter.  No orders of the defined types were placed in this encounter.     Procedures:  No procedures performed   Clinical Data: No additional findings.  Objective: Vital Signs: There were no vitals taken for this visit.  Physical Exam:  Constitutional: Patient appears well-developed HEENT:  Head: Normocephalic Eyes:EOM are normal Neck: Normal range of motion Cardiovascular: Normal rate Pulmonary/chest: Effort normal Neurologic: Patient is alert Skin: Skin is warm Psychiatric: Patient has normal mood and affect  Ortho Exam:  Right knee Exam Tenderness to palpation over the medial joint line No effusion Extensor mechanism intact No TTP over the lateral jointlines, quad tendon, patellar tendon,  pes anserinus, patella, tibial tubercle, LCL/MCL insertions Stable to varus/valgus stresses.  Stable to anterior/posterior drawer Extension to 0 degrees Flexion > 90 degrees No pain in the groin elicited with internal rotation/external rotation of the right hip joint.,  No nerve root tension signs.  Right shoulder Exam Able to forward flex and AB duct arm actively less than 90 degrees No TTP over the [facility] joint or bicipital groove 5/5 motor strength of the subscapularis muscle.  Marked weakness of the infraspinatus/supraspinatus muscles Negative Hawkins impingement 5/5 grip strength, forearm pronation/supination, and bicep strength  Specialty Comments:  No specialty comments available.  Imaging: No results found.   PMFS History: Patient Active Problem List   Diagnosis Date Noted  . Hemorrhagic cerebrovascular accident (CVA) ([place]) 04/20/2018  . Subclinical hypothyroidism 03/24/2017  . DJD (degenerative joint disease), cervical 05/12/2016  . Foraminal stenosis of cervical region 08/18/2015  . Chronic pain of both knees 02/12/2015  . Major depression, recurrent, chronic ([place]) 02/12/2015  . Zenker diverticulum 05/08/2014  . Osteopenia 06/08/2012  . Coronary artery disease involving native coronary artery of native heart without angina pectoris 05/16/2012  . Generalized anxiety disorder 01/10/2012  . History of non-ST elevation myocardial infarction (NSTEMI) 12/05/2011  . Gastro-esophageal reflux disease without esophagitis 08/12/2011  . Mixed hyperlipidemia 12/31/2010  . Osteoarthritis, hand 12/31/2010  . Rosacea 12/31/2010  . Paroxysmal atrial fibrillation ([place]) 12/14/2010  . Essential hypertension 01/09/2009   Past Medical History:  Diagnosis Date  . Atrial fibrillation ([place])   . Coronary artery disease   . GERD (gastroesophageal reflux disease)    endoscopy and esophageal diatation 2013 Dr. Percell Miller  . Hyperlipemia   . Leiomyoma of body of uterus   . Long term current use  of anticoagulant therapy 07/07/2015   Stopped 2020; no recurrence of PAF since ablation. Stopped due to hemorrhagic cva.   . Osteopenia   . Stroke ([place])   . Subclinical hypothyroidism 03/24/2017   Started synthroid 25 03/2017 for increasing TSH.    Family History  Problem Relation Age of Onset  . Atrial fibrillation Mother   . Transient ischemic attack Mother   . Stroke Mother   . Alcohol abuse Father   . Diabetes Father   . Early death Father   . Heart attack Father   . Diabetes Brother   . Kidney disease Maternal Grandmother   . Early death Maternal Grandfather   . Diabetes Paternal Grandmother   . Heart attack Paternal Grandmother   . Heart attack Maternal Uncle   . Stroke Son   . Stroke Paternal Aunt     Past Surgical History:  Procedure Laterality Date  . ABLATION OF DYSRHYTHMIC FOCUS    . BLADDER REPAIR  2011  . BREAST BIOPSY Left 08/19/2013  . CAROTID STENT  12/04/2011  . ESOPHAGEAL DILATION  2013  . Floating Kidney  1963  . TUBAL LIGATION  1985   Social History   Occupational History  .  Not on file  Tobacco Use  . Smoking status: Never Smoker  . Smokeless tobacco: Never Used  Substance and Sexual Activity  . Alcohol use: Yes    Alcohol/week: 2.0 - 3.0 standard drinks    Types: 2 - 3 Glasses of wine per week    Comment: Red Wine  . Drug use: No  . Sexual activity: Never

## 2019-05-11 ENCOUNTER — Other Ambulatory Visit: Payer: Self-pay | Admitting: Family Medicine

## 2019-05-13 NOTE — Telephone Encounter (Signed)
LAST APPOINTMENT DATE: @1 /02/2020 NEXT APPOINTMENT DATE:@ no app made  Last lab for TSH : 05/18/2017  LAST REFILL: 11/12/2018 #90 1 rf  ok to give more or need app for lab?

## 2019-05-24 ENCOUNTER — Encounter: Payer: Medicare Other | Admitting: Family Medicine

## 2019-05-24 ENCOUNTER — Ambulatory Visit: Payer: Medicare Other

## 2019-06-05 ENCOUNTER — Encounter: Payer: Medicare Other | Admitting: Family Medicine

## 2019-06-06 ENCOUNTER — Ambulatory Visit: Payer: Medicare Other

## 2019-06-06 ENCOUNTER — Ambulatory Visit: Payer: Medicare Other | Admitting: Orthopedic Surgery

## 2019-06-26 ENCOUNTER — Telehealth: Payer: Self-pay | Admitting: Family Medicine

## 2019-06-26 NOTE — Telephone Encounter (Signed)
Left message for patient to call back and schedule Medicare Annual Wellness Visit (AWV) either virtually/audio only OR in office. Whatever the patients preference is.  No hx; please schedule at anytime with [facility] Health Advisor at [facility].

## 2019-07-09 ENCOUNTER — Other Ambulatory Visit: Payer: Self-pay | Admitting: Family Medicine

## 2019-07-09 DIAGNOSIS — Z1231 Encounter for screening mammogram for malignant neoplasm of breast: Secondary | ICD-10-CM

## 2019-07-10 DIAGNOSIS — L600 Ingrowing nail: Secondary | ICD-10-CM | POA: Diagnosis not present

## 2019-07-16 ENCOUNTER — Ambulatory Visit: Payer: Medicare Other

## 2019-07-22 ENCOUNTER — Other Ambulatory Visit: Payer: Self-pay | Admitting: Family Medicine

## 2019-07-24 ENCOUNTER — Ambulatory Visit: Payer: Medicare Other | Admitting: Orthopedic Surgery

## 2019-08-07 ENCOUNTER — Ambulatory Visit: Payer: Medicare Other | Admitting: Family Medicine

## 2019-10-28 ENCOUNTER — Ambulatory Visit (INDEPENDENT_AMBULATORY_CARE_PROVIDER_SITE_OTHER): Payer: Medicare Other | Admitting: Family Medicine

## 2019-10-28 ENCOUNTER — Encounter: Payer: Self-pay | Admitting: Family Medicine

## 2019-10-28 ENCOUNTER — Other Ambulatory Visit: Payer: Self-pay

## 2019-10-28 VITALS — BP 126/70 | HR 74 | Temp 97.4°F | Resp 18 | Ht 62.0 in | Wt 149.4 lb

## 2019-10-28 DIAGNOSIS — Z0000 Encounter for general adult medical examination without abnormal findings: Secondary | ICD-10-CM | POA: Diagnosis not present

## 2019-10-28 DIAGNOSIS — I10 Essential (primary) hypertension: Secondary | ICD-10-CM

## 2019-10-28 DIAGNOSIS — F411 Generalized anxiety disorder: Secondary | ICD-10-CM

## 2019-10-28 DIAGNOSIS — I480 Paroxysmal atrial fibrillation: Secondary | ICD-10-CM | POA: Diagnosis not present

## 2019-10-28 DIAGNOSIS — Z780 Asymptomatic menopausal state: Secondary | ICD-10-CM

## 2019-10-28 DIAGNOSIS — I2510 Atherosclerotic heart disease of native coronary artery without angina pectoris: Secondary | ICD-10-CM

## 2019-10-28 DIAGNOSIS — E782 Mixed hyperlipidemia: Secondary | ICD-10-CM | POA: Diagnosis not present

## 2019-10-28 DIAGNOSIS — Z1231 Encounter for screening mammogram for malignant neoplasm of breast: Secondary | ICD-10-CM | POA: Diagnosis not present

## 2019-10-28 DIAGNOSIS — M8580 Other specified disorders of bone density and structure, unspecified site: Secondary | ICD-10-CM

## 2019-10-28 DIAGNOSIS — E039 Hypothyroidism, unspecified: Secondary | ICD-10-CM | POA: Diagnosis not present

## 2019-10-28 DIAGNOSIS — M75121 Complete rotator cuff tear or rupture of right shoulder, not specified as traumatic: Secondary | ICD-10-CM

## 2019-10-28 DIAGNOSIS — I252 Old myocardial infarction: Secondary | ICD-10-CM

## 2019-10-28 DIAGNOSIS — F339 Major depressive disorder, recurrent, unspecified: Secondary | ICD-10-CM

## 2019-10-28 DIAGNOSIS — E038 Other specified hypothyroidism: Secondary | ICD-10-CM

## 2019-10-28 DIAGNOSIS — M1711 Unilateral primary osteoarthritis, right knee: Secondary | ICD-10-CM

## 2019-10-28 NOTE — Patient Instructions (Signed)
Please return in 6 months for hypertension follow up. Needs AWV as well.  Please schedule your mammogram and bone density.   We can call you to return for a knee injection with synvisc gel.  We will call you with your lab results.  If you have any questions or concerns, please don't hesitate to send me a message via MyChart or call the office at [phone]. Thank you for visiting with [place] today! It's our pleasure caring for you.

## 2019-10-28 NOTE — Progress Notes (Signed)
Subjective  Chief Complaint  Patient presents with  . Annual Exam    No new concerns. "She feels wonderful and blessed to be here". Non-fasting labs  . Health Maintenance    Dexa scan due     HPI: Brittney Cochran is a 79 y.o. female who presents to [place] [facility] today for a Female Wellness Visit. She also has the concerns and/or needs as listed above in the chief complaint. These will be addressed in addition to the Health Maintenance Visit.   Wellness Visit: annual visit with health maintenance review and exam without Pap   Health maintenance: Patient is overdue for mammogram and bone density to follow-up on osteopenia.  She is now ready to have the studies done.  She reports that she physically feels pretty well.  Emotionally he is doing well.  Eating well.  Chronic disease f/u and/or acute problem visit: (deemed necessary to be done in addition to the wellness visit):  Coronary artery disease with remote history of stenting and history of PAF: Reviewed cardiology notes from this year.  She had a ZIO monitor which did not show any A. fib.  She has since been taken off anticoagulation and changed to baby aspirin daily.  She has had no chest pain or shortness of breath.  No heart failure symptoms.  She continues on Ranexa.  Hyperlipidemia on a statin and overdue for lipid panel.  She is nonfasting today.  Tolerates it well.  Generalized anxiety disorder and depression very well controlled on long-term Zoloft.  She has no mood concerns at this time.  Hypertension has been controlled  Subclinical hypothyroidism on low-dose Synthroid.  Due for recheck.  She reports that her energy is good.  No symptoms of high or low thyroid.  She is compliant with her medication.  Knee osteoarthritis and right rotator cuff tear: I reviewed notes from orthopedics.  She had a shoulder MRI which did reveal 2 rotator cuff tears with tendon shortening.  She has multiple questions  regarding options of care.  Her pain has improved although at times she will have sharp pains depending on her motion.  Range of motion is improved but still with significant limitations.  She also has right knee arthritis, I reviewed her most recent x-ray.  She continues to have pain related to this.  Her last steroid injection was given in September of last year.  Resolved her pain for about 6 weeks.  She has never had hyaluronic acid injections.  She would be interested in this.  No locking or give way symptoms.  She does use a cane to ambulate.  Assessment  1. Annual physical exam   2. Coronary artery disease involving native coronary artery of native heart without angina pectoris   3. Generalized anxiety disorder   4. Paroxysmal atrial fibrillation (HCC)   5. Osteopenia, unspecified location   6. Mixed hyperlipidemia   7. Essential hypertension   8. History of non-ST elevation myocardial infarction (NSTEMI)   9. Major depression, recurrent, chronic ([place])   10. Subclinical hypothyroidism   11. Encounter for screening mammogram for breast cancer   12. Asymptomatic menopausal state   13. Primary osteoarthritis of right knee   14. Nontraumatic complete tear of right rotator cuff      Plan  Female Wellness Visit:  Age appropriate Health Maintenance and Prevention measures were discussed with patient. Included topics are cancer screening recommendations, ways to keep healthy (see AVS) including dietary and  exercise recommendations, regular eye and dental care, use of seat belts, and avoidance of moderate alcohol use and tobacco use.   BMI: discussed patient's BMI and encouraged positive lifestyle modifications to help get to or maintain a target BMI.  HM needs and immunizations were addressed and ordered. See below for orders. See HM and immunization section for updates.  Routine labs and screening tests ordered including cmp, cbc and lipids where appropriate.  Discussed recommendations  regarding Vit D and calcium supplementation (see AVS)  Chronic disease management visit and/or acute problem visit:  Recheck lab recheck lab work to follow-up on hyperlipidemia and hypertension and hypothyroidism.  Clinically these problems are well controlled.  No symptoms of coronary artery disease or arrhythmia.  Continue on aspirin daily.  Osteoarthritis of knee: Discussed options of care.  Recommend Synvisc 1 injection.  Will call to get scheduled.  Complete tear right rotator cuff: Education given.  Prognosis discussed.  Currently nonsurgical but if she is experiencing increasing pain a steroid injection would be indicated.  She could have that done here for the orthopedic office.  Continue to follow-up if things worsen and consider surgical intervention in the future.  Patient understands and agrees with care plan.  History of PAF: Continue baby aspirin.  Mood disorder: Well-controlled on Zoloft.  Continue chronically  Follow up: 6 months for recheck: Annual wellness visit due Orders Placed This Encounter  Procedures  . MM DIGITAL SCREENING BILATERAL  . DG Bone Density  . CBC with Differential/Platelet  . Comprehensive metabolic panel  . Lipid panel  . T3  . T4, free  . TSH   No orders of the defined types were placed in this encounter.     Lifestyle: Body mass index is 27.33 kg/m. Wt Readings from Last 3 Encounters:  10/28/19 149 lb 6.4 oz (67.8 kg)  03/26/19 152 lb 6.4 oz (69.1 kg)  11/29/18 141 lb 12.8 oz (64.3 kg)     Patient Active Problem List   Diagnosis Date Noted  . Hemorrhagic cerebrovascular accident (CVA) ([place]) 04/20/2018    Priority: High    Left frontal lobe by MRI 03/2018; Dr. Saintclair [place]sted [facility] Secondary subdural hematoma   . Major depression, recurrent, chronic ([place]) 02/12/2015    Priority: High  . Coronary artery disease involving native coronary artery of native heart without angina pectoris 05/16/2012    Priority: High    Cardiac MRI - EF normalized  to 55% from 40%, no wall motion abnormalities nor scarring For lexiscan stress test 07/2013 - unremarkable/cla    . Generalized anxiety disorder 01/10/2012    Priority: High    Triggers are winter months, snow, darkness, family problems: has panic sxs. Controlled on prn xanax and zolft.    . History of non-ST elevation myocardial infarction (NSTEMI) 12/05/2011    Priority: High    Overview:  12/02/11 - [facility], s/p PTCA RCA, nonSTEMI EF 60%   . Mixed hyperlipidemia 12/31/2010    Priority: High    Overview:  Goal LDL < 80; cards managing   . Paroxysmal atrial fibrillation ([place]) 12/14/2010    Priority: [place] follows coumadin levels Heart doc said ok to use celebrex    . Essential hypertension 01/09/2009    Priority: High  . Subclinical hypothyroidism 03/24/2017    Priority: Medium    Started synthroid 25 03/2017 for increasing TSH.   . DJD (degenerative joint disease), cervical 05/12/2016    Priority: Medium  . Foraminal stenosis of  cervical region 08/18/2015    Priority: Medium    Overview:  xrays 2017; multilevel DJD   . Chronic pain of both knees 02/12/2015    Priority: Medium  . Zenker diverticulum 05/08/2014    Priority: Medium    Overview:  By upper GI study.  GI is following.  Patient hoping to avoid surgical repair.  2016   . Osteopenia 06/08/2012    Priority: Medium    Overview:  DEXA 2016 T=-0.2 lumbar spine, -1.6 at left femur. Stable 2016, mild worsening Dexa 07/2017: stable at femur; osteopenia. Recheck 2 years.    . Gastro-esophageal reflux disease without esophagitis 08/12/2011    Priority: Medium  . Osteoarthritis, hand 12/31/2010    Priority: Low  . Rosacea 12/31/2010    Priority: Low   Health Maintenance  Topic Date Due  . MAMMOGRAM  08/02/2018  . DEXA SCAN  08/02/2019  . INFLUENZA VACCINE  10/13/2019  . COVID-19 Vaccine  Completed  . PNA vac Low Risk Adult  Completed   Immunization History  Administered  Date(s) Administered  . Fluad Quad(high Dose 65+) 11/29/2018  . Influenza Split 01/11/2007, 02/04/2008, 01/01/2009, 04/19/2010, 12/14/2010, 01/12/2011  . Influenza, High Dose Seasonal PF 11/29/2011, 12/18/2012, 12/10/2013, 02/02/2015, 12/16/2016, 12/01/2017  . Influenza, Seasonal, Injecte, Preservative Fre 03/14/2009, 12/08/2015  . Influenza,inj,quad, With Preservative 03/14/2009  . Influenza-Unspecified 03/14/2009  . Moderna SARS-COVID-2 Vaccination 04/12/2019, 05/08/2019  . Pneumococcal Conjugate-13 04/08/2014  . Pneumococcal Polysaccharide-23 01/01/2009, 03/14/2009  . Pneumococcal-Unspecified 03/14/2009  . Tdap 03/14/2004, 03/14/2006   We updated and reviewed the patient's past history in detail and it is documented below. Allergies: Patient has No Known Allergies. Past Medical History Patient  has a past medical history of Atrial fibrillation ([place]), Coronary artery disease, GERD (gastroesophageal reflux disease), Hyperlipemia, Leiomyoma of body of uterus, Long term current use of anticoagulant therapy (07/07/2015), Osteopenia, Stroke ([place]), and Subclinical hypothyroidism (03/24/2017). Past Surgical History Patient  has a past surgical history that includes Breast biopsy (Left, 08/19/2013); Ablation of dysrhythmic focus; Bladder repair (2011); Tubal ligation (1985); Floating Kidney [phone]); Carotid stent (12/04/2011); and Esophageal dilation (2013). Family History: Patient family history includes Alcohol abuse in her father; Atrial fibrillation in her mother; Diabetes in her brother, father, and paternal grandmother; Early death in her father and maternal grandfather; Heart attack in her father, maternal uncle, and paternal grandmother; Kidney disease in her maternal grandmother; Stroke in her mother, paternal aunt, and son; Transient ischemic attack in her mother. Social History:  Patient  reports that she has never smoked. She has never used smokeless tobacco. She reports current alcohol use  of about 2.0 - 3.0 standard drinks of alcohol per week. She reports that she does not use drugs.  Review of Systems: Constitutional: negative for fever or malaise Ophthalmic: negative for photophobia, double vision or loss of vision Cardiovascular: negative for chest pain, dyspnea on exertion, or new LE swelling Respiratory: negative for SOB or persistent cough Gastrointestinal: negative for abdominal pain, change in bowel habits or melena Genitourinary: negative for dysuria or gross hematuria, no abnormal uterine bleeding or disharge Musculoskeletal: negative for new gait disturbance or muscular weakness Integumentary: negative for new or persistent rashes, no breast lumps Neurological: negative for TIA or stroke symptoms Psychiatric: negative for SI or delusions Allergic/Immunologic: negative for hives  Patient Care Team    Relationship Specialty Notifications Start End  Leamon Arnt, MD PCP - General Family Medicine  03/21/17   Kary Kos, [place] Physician Neurosurgery  03/26/18   Quillian Quince,  Georgeanna Harrison., DO Referring Physician Cardiology  03/26/18   Ebbie Ridge, MD Referring Physician Cardiology  03/26/18     Objective  Vitals: BP 126/70   Pulse 74   Temp (!) 97.4 F (36.3 C) (Temporal)   Resp 18   Ht 5\' 2"  (1.575 m)   Wt 149 lb 6.4 oz (67.8 kg)   SpO2 96%   BMI 27.33 kg/m  General:  Well developed, well nourished, no acute distress  Psych:  Alert and orientedx3,normal mood and affect HEENT:  Normocephalic, atraumatic, non-icteric sclera,  supple neck without adenopathy, mass or thyromegaly Cardiovascular:  Normal S1, S2, RRR without gallop, rub or murmur Respiratory:  Good breath sounds bilaterally, CTAB with normal respiratory effort Gastrointestinal: normal bowel sounds, soft, non-tender, no noted masses. No HSM MSK: Right knee with crepitus and osteoarthritic changes, no effusion, warmth or redness.  Right shoulder with limited range of motion.  No contusions.  Joints are without erythema or swelling.  Skin:  Warm, no rashes or suspicious lesions noted Neurologic:    Mental status is normal. CN 2-11 are normal. Gross motor and sensory exams are normal. Normal gait. No tremor    Commons side effects, risks, benefits, and alternatives for medications and treatment plan prescribed today were discussed, and the patient expressed understanding of the given instructions. Patient is instructed to call or message via MyChart if he/she has any questions or concerns regarding our treatment plan. No barriers to understanding were identified. We discussed Red Flag symptoms and signs in detail. Patient expressed understanding regarding what to do in case of urgent or emergency type symptoms.   Medication list was reconciled, printed and provided to the patient in AVS. Patient instructions and summary information was reviewed with the patient as documented in the AVS. This note was prepared with assistance of Dragon voice recognition software. Occasional wrong-word or sound-a-like substitutions may have occurred due to the inherent limitations of voice recognition software  This visit occurred during the SARS-CoV-2 public health emergency.  Safety protocols were in place, including screening questions prior to the visit, additional usage of staff PPE, and extensive cleaning of exam room while observing appropriate contact time as indicated for disinfecting solutions.

## 2019-10-29 LAB — CBC WITH DIFFERENTIAL/PLATELET
Absolute Monocytes: 975 cells/uL — ABNORMAL HIGH (ref 200–950)
Basophils Absolute: 30 cells/uL (ref 0–200)
Basophils Relative: 0.4 %
Eosinophils Absolute: 90 cells/uL (ref 15–500)
Eosinophils Relative: 1.2 %
HCT: 38.3 % (ref 35.0–45.0)
Hemoglobin: 12.3 g/dL (ref 11.7–15.5)
Lymphs Abs: 1455 cells/uL (ref 850–3900)
MCH: 30.5 pg (ref 27.0–33.0)
MCHC: 32.1 g/dL (ref 32.0–36.0)
MCV: 95 fL (ref 80.0–100.0)
MPV: 12.8 fL — ABNORMAL HIGH (ref 7.5–12.5)
Monocytes Relative: 13 %
Neutro Abs: 4950 cells/uL (ref 1500–7800)
Neutrophils Relative %: 66 %
Platelets: 166 10*3/uL (ref 140–400)
RBC: 4.03 10*6/uL (ref 3.80–5.10)
RDW: 12 % (ref 11.0–15.0)
Total Lymphocyte: 19.4 %
WBC: 7.5 10*3/uL (ref 3.8–10.8)

## 2019-10-29 LAB — COMPREHENSIVE METABOLIC PANEL
AG Ratio: 1.7 (calc) (ref 1.0–2.5)
ALT: 23 U/L (ref 6–29)
AST: 30 U/L (ref 10–35)
Albumin: 4 g/dL (ref 3.6–5.1)
Alkaline phosphatase (APISO): 75 U/L (ref 37–153)
BUN: 21 mg/dL (ref 7–25)
CO2: 28 mmol/L (ref 20–32)
Calcium: 9.5 mg/dL (ref 8.6–10.4)
Chloride: 103 mmol/L (ref 98–110)
Creat: 0.86 mg/dL (ref 0.60–0.88)
Globulin: 2.3 g/dL (calc) (ref 1.9–3.7)
Glucose, Bld: 109 mg/dL — ABNORMAL HIGH (ref 65–99)
Potassium: 4.8 mmol/L (ref 3.5–5.3)
Sodium: 138 mmol/L (ref 135–146)
Total Bilirubin: 0.6 mg/dL (ref 0.2–1.2)
Total Protein: 6.3 g/dL (ref 6.1–8.1)

## 2019-10-29 LAB — LIPID PANEL
Cholesterol: 134 mg/dL (ref <200)
HDL: 60 mg/dL (ref >=50)
LDL Cholesterol (Calc): 56 mg/dL (calc)
Non-HDL Cholesterol (Calc): 74 mg/dL (calc) (ref <130)
Total CHOL/HDL Ratio: 2.2 (calc) (ref <5.0)
Triglycerides: 94 mg/dL (ref <150)

## 2019-10-29 LAB — T4, FREE: Free T4: 1 ng/dL (ref 0.8–1.8)

## 2019-10-29 LAB — T3: T3, Total: 99 ng/dL (ref 76–181)

## 2019-10-29 LAB — TSH: TSH: 4.4 mIU/L (ref 0.40–4.50)

## 2019-11-03 NOTE — Progress Notes (Signed)
Please call patient: I have reviewed his/her lab results. All lab results are stable. Things look good.

## 2019-11-27 ENCOUNTER — Other Ambulatory Visit: Payer: Self-pay | Admitting: Family Medicine

## 2019-11-27 DIAGNOSIS — M8580 Other specified disorders of bone density and structure, unspecified site: Secondary | ICD-10-CM

## 2019-11-27 DIAGNOSIS — Z780 Asymptomatic menopausal state: Secondary | ICD-10-CM

## 2019-11-28 ENCOUNTER — Other Ambulatory Visit: Payer: Medicare Other

## 2019-11-29 ENCOUNTER — Other Ambulatory Visit: Payer: Self-pay | Admitting: Family Medicine

## 2019-12-13 DIAGNOSIS — B354 Tinea corporis: Secondary | ICD-10-CM | POA: Diagnosis not present

## 2019-12-23 ENCOUNTER — Ambulatory Visit: Payer: Medicare Other

## 2020-01-08 ENCOUNTER — Encounter: Payer: Self-pay | Admitting: Family Medicine

## 2020-01-08 ENCOUNTER — Other Ambulatory Visit: Payer: Self-pay

## 2020-01-08 ENCOUNTER — Ambulatory Visit (INDEPENDENT_AMBULATORY_CARE_PROVIDER_SITE_OTHER): Payer: Medicare Other | Admitting: Family Medicine

## 2020-01-08 VITALS — BP 110/64 | HR 69 | Temp 97.9°F | Resp 16 | Ht 62.0 in | Wt 150.0 lb

## 2020-01-08 DIAGNOSIS — M75121 Complete rotator cuff tear or rupture of right shoulder, not specified as traumatic: Secondary | ICD-10-CM

## 2020-01-08 DIAGNOSIS — M1711 Unilateral primary osteoarthritis, right knee: Secondary | ICD-10-CM | POA: Diagnosis not present

## 2020-01-08 MED ORDER — ALPRAZOLAM 0.5 MG PO TABS: 0.5000 mg | ORAL_TABLET | Freq: Every day | ORAL | 0 refills | Status: DC | PRN

## 2020-01-08 NOTE — Patient Instructions (Addendum)
We will call you for your knee injection. Please let me know if we don't call within two weeks to get you scheduled.  Today you were given your high dose flu vaccination.   You had a steroid injection today.   Things to be aware of after this injection are listed below:  You may experience no significant improvement or even a slight worsening in your symptoms during the first 24 to 48 hours.  After that we expect your symptoms to improve gradually over the next 2 weeks for the medicine to have its maximal effect.  You should continue to have improvement out to 6 weeks after your injection.  I recommend icing the site of the injection for 20 minutes  1-2 times the day of your injection  You may shower but no swimming, tub bath or Jacuzzi for 24 hours.  If your bandage falls off this does not need to be replaced.  It is appropriate to remove the bandage after 4 hours.  You may resume light activities as tolerated.     POSSIBLE PROCEDURE SIDE EFFECTS: The side effects of the injection are usually fairly minimal however if you may experience some of the following side effects that are usually self-limited and will is off on their own.  If you are concerned please feel free to call the office with questions:             Increased numbness or tingling             Nausea or vomiting             Swelling or bruising at the injection site    Please call our office if if you experience any of the following symptoms over the next week as these can be signs of infection:              Fever greater than 100.58F             Significant swelling at the injection site             Significant redness or drainage from the injection site

## 2020-01-08 NOTE — Progress Notes (Signed)
Subjective  CC:  Chief Complaint  Patient presents with  . Knee Pain  . Shoulder Pain    right, torn ligaments     HPI: Brittney Cochran is a 79 y.o. female who presents to the office today to address the problems listed above in the chief complaint.  Right shoulder pain is worsening. See last note from august: MRI with torn rotator cuff, non surgical. Now with weekly increased pain limiting activity. No new injury or weakness. rom remains fairly good.   Right knee OA: wants synvisc.    Assessment  1. Nontraumatic complete tear of right rotator cuff   2. Primary osteoarthritis of right knee      Plan   RCT:  S/p steroid injection today w/ Routine post procedure care instructions were given to patient in detail.  Return for synvisc.   Follow up: Return if symptoms worsen or fail to improve.  04/29/2020  No orders of the defined types were placed in this encounter.  Meds ordered this encounter  Medications  . ALPRAZolam (XANAX) 0.5 MG tablet    Sig: Take 1 tablet (0.5 mg total) by mouth daily as needed.    Dispense:  30 tablet    Refill:  0      I reviewed the patients updated PMH, FH, and SocHx.    Patient Active Problem List   Diagnosis Date Noted  . Hemorrhagic cerebrovascular accident (CVA) ([place]) 04/20/2018    Priority: High  . Major depression, recurrent, chronic ([place]) 02/12/2015    Priority: High  . Coronary artery disease involving native coronary artery of native heart without angina pectoris 05/16/2012    Priority: High  . Generalized anxiety disorder 01/10/2012    Priority: High  . History of non-ST elevation myocardial infarction (NSTEMI) 12/05/2011    Priority: High  . Mixed hyperlipidemia 12/31/2010    Priority: High  . Paroxysmal atrial fibrillation ([place]) 12/14/2010    Priority: High  . Essential hypertension 01/09/2009    Priority: High  . Subclinical hypothyroidism 03/24/2017    Priority: Medium  . DJD (degenerative joint disease),  cervical 05/12/2016    Priority: Medium  . Foraminal stenosis of cervical region 08/18/2015    Priority: Medium  . Chronic pain of both knees 02/12/2015    Priority: Medium  . Zenker diverticulum 05/08/2014    Priority: Medium  . Osteopenia 06/08/2012    Priority: Medium  . Gastro-esophageal reflux disease without esophagitis 08/12/2011    Priority: Medium  . Osteoarthritis, hand 12/31/2010    Priority: Low  . Rosacea 12/31/2010    Priority: Low   Current Meds  Medication Sig  . ALPRAZolam (XANAX) 0.5 MG tablet Take 1 tablet (0.5 mg total) by mouth daily as needed.  Marland Kitchen aspirin EC 81 MG tablet Take 81 mg by mouth daily. Swallow whole.  . carvedilol (COREG) 12.5 MG tablet TK 1 T PO BID WITH MEALS  . Co-Enzyme Q-10 30 MG CAPS Take by mouth.  . diclofenac sodium (VOLTAREN) 1 % GEL Apply 2 g topically 4 (four) times daily. To hands as needed  . fluticasone (FLONASE) 50 MCG/ACT nasal spray USE 1 SPRAY NASALLY DAILY  . lisinopril (PRINIVIL,ZESTRIL) 2.5 MG tablet Take 2.5 mg by mouth daily.   . nitroGLYCERIN (NITROSTAT) 0.4 MG SL tablet Place 0.4 mg under the tongue.  Marland Kitchen omeprazole (PRILOSEC) 40 MG capsule Take 1 capsule (40 mg total) by mouth daily.  . ranolazine (RANEXA) 500 MG 12 hr tablet Take 500 mg by  mouth 2 (two) times daily.   . rosuvastatin (CRESTOR) 40 MG tablet Take 40 mg by mouth daily.   . sertraline (ZOLOFT) 100 MG tablet TAKE 1 AND 1/2 TABLETS BY MOUTH DAILY  . SYNTHROID 25 MCG tablet TAKE 1 TABLET BY MOUTH DAILY BEFORE BREAKFAST  . VITAMIN D, CHOLECALCIFEROL, PO Take by mouth.  . [DISCONTINUED] ALPRAZolam (XANAX) 0.5 MG tablet Take 1 tablet (0.5 mg total) by mouth daily as needed.    Allergies: Patient has No Known Allergies. Family History: Patient family history includes Alcohol abuse in her father; Atrial fibrillation in her mother; Diabetes in her brother, father, and paternal grandmother; Early death in her father and maternal grandfather; Heart attack in her  father, maternal uncle, and paternal grandmother; Kidney disease in her maternal grandmother; Stroke in her mother, paternal aunt, and son; Transient ischemic attack in her mother. Social History:  Patient  reports that she has never smoked. She has never used smokeless tobacco. She reports current alcohol use of about 2.0 - 3.0 standard drinks of alcohol per week. She reports that she does not use drugs.  Review of Systems: Constitutional: Negative for fever malaise or anorexia Cardiovascular: negative for chest pain Respiratory: negative for SOB or persistent cough Gastrointestinal: negative for abdominal pain  Objective  Vitals: BP 110/64   Pulse 69   Temp 97.9 F (36.6 C) (Temporal)   Resp 16   Ht 5\' 2"  (1.575 m)   Wt 150 lb (68 kg)   SpO2 97%   BMI 27.44 kg/m  General: no acute distress , A&Ox3 Right shoulder: FROM. + empty can testing.   Shoulder Injection Procedure Note  Pre-operative Diagnosis: right    Post-operative Diagnosis: same  Indications: Symptomatic relief and failed conservative measures  Anesthesia: Lidocaine 1% without epinephrine without added sodium bicarbonate  Procedure Details   Verbal consent was obtained for the procedure. The shoulder was prepped with alcohol  and the skin was anesthetized with cold spray. Using a 22 gauge needle the subacromial space was injected with 4 mL 1% lidocaine and 1 mL of triamcinolone (KENALOG) 40mg /ml under the posterior aspect of the acromion. The injection site was cleansed with topical isopropyl alcohol and a dressing was applied.  Complications:  None; patient tolerated the procedure well.    Commons side effects, risks, benefits, and alternatives for medications and treatment plan prescribed today were discussed, and the patient expressed understanding of the given instructions. Patient is instructed to call or message via MyChart if he/she has any questions or concerns regarding our treatment plan. No barriers  to understanding were identified. We discussed Red Flag symptoms and signs in detail. Patient expressed understanding regarding what to do in case of urgent or emergency type symptoms.   Medication list was reconciled, printed and provided to the patient in AVS. Patient instructions and summary information was reviewed with the patient as documented in the AVS. This note was prepared with assistance of Dragon voice recognition software. Occasional wrong-word or sound-a-like substitutions may have occurred due to the inherent limitations of voice recognition software  This visit occurred during the SARS-CoV-2 public health emergency.  Safety protocols were in place, including screening questions prior to the visit, additional usage of staff PPE, and extensive cleaning of exam room while observing appropriate contact time as indicated for disinfecting solutions.

## 2020-01-09 DIAGNOSIS — L820 Inflamed seborrheic keratosis: Secondary | ICD-10-CM | POA: Diagnosis not present

## 2020-01-09 DIAGNOSIS — L218 Other seborrheic dermatitis: Secondary | ICD-10-CM | POA: Diagnosis not present

## 2020-01-09 DIAGNOSIS — B359 Dermatophytosis, unspecified: Secondary | ICD-10-CM | POA: Diagnosis not present

## 2020-01-09 DIAGNOSIS — X32XXXS Exposure to sunlight, sequela: Secondary | ICD-10-CM | POA: Diagnosis not present

## 2020-01-09 DIAGNOSIS — L718 Other rosacea: Secondary | ICD-10-CM | POA: Diagnosis not present

## 2020-01-09 DIAGNOSIS — L814 Other melanin hyperpigmentation: Secondary | ICD-10-CM | POA: Diagnosis not present

## 2020-01-10 ENCOUNTER — Other Ambulatory Visit: Payer: Self-pay | Admitting: Family Medicine

## 2020-01-10 ENCOUNTER — Telehealth: Payer: Self-pay

## 2020-01-10 NOTE — Telephone Encounter (Signed)
PA for Synvisc injections not needed with patient's insurance plan. Please order two doses of Synvisc 16mg /2 mL

## 2020-01-16 NOTE — Telephone Encounter (Signed)
2 Synvisc injections received and given to [facility]

## 2020-01-16 NOTE — Telephone Encounter (Signed)
Please have patient scheduled for injection

## 2020-01-17 MED ORDER — TRIAMCINOLONE ACETONIDE 40 MG/ML IJ SUSP
40.0000 mg | Freq: Once | INTRAMUSCULAR | Status: AC
  Administered 2020-01-08: 40 mg via INTRAMUSCULAR

## 2020-01-17 NOTE — Telephone Encounter (Signed)
LVM to have pt call back

## 2020-01-17 NOTE — Addendum Note (Signed)
Addended by: Doran Clay A on: 01/17/2020 07:23 AM   Modules accepted: Orders

## 2020-01-20 NOTE — Telephone Encounter (Signed)
Patient is scheduled for the injection but she would like to know if she needs someone to come with her, and if she is to need a cane or walker.

## 2020-01-20 NOTE — Telephone Encounter (Signed)
She does not need either

## 2020-01-31 ENCOUNTER — Ambulatory Visit: Payer: Medicare Other | Admitting: Family Medicine

## 2020-02-19 ENCOUNTER — Other Ambulatory Visit: Payer: Self-pay

## 2020-02-19 ENCOUNTER — Ambulatory Visit (INDEPENDENT_AMBULATORY_CARE_PROVIDER_SITE_OTHER): Payer: Medicare Other | Admitting: Family Medicine

## 2020-02-19 VITALS — BP 114/68 | HR 68 | Temp 97.5°F | Resp 16 | Ht 62.0 in | Wt 147.0 lb

## 2020-02-19 DIAGNOSIS — M940 Chondrocostal junction syndrome [Tietze]: Secondary | ICD-10-CM

## 2020-02-19 DIAGNOSIS — S0083XA Contusion of other part of head, initial encounter: Secondary | ICD-10-CM | POA: Diagnosis not present

## 2020-02-19 DIAGNOSIS — M75121 Complete rotator cuff tear or rupture of right shoulder, not specified as traumatic: Secondary | ICD-10-CM

## 2020-02-19 DIAGNOSIS — M1711 Unilateral primary osteoarthritis, right knee: Secondary | ICD-10-CM

## 2020-02-19 NOTE — Progress Notes (Signed)
Subjective  CC:  Chief Complaint  Patient presents with  . Osteoarthritis    HPI: Brittney Cochran is a 79 y.o. female who presents to the office today to address the problems listed above in the chief complaint.  Osteoarthritis right knee: Here for Synvisc injection.  First of 3.  Continues to have pain.  No redness or warmth to the knee joint.  Refer to last note for details  S/p steroid injection of the right shoulder at last visit.  Unfortunately, since that time she experienced a fall.  She tripped over her cat.  She was carrying a box at the time and landed forward onto her buttocks.  Glasses in box hit her face, chest wall and right shoulder.  She continues to have pain in the right shoulder.  She has chest wall pain, worse with deep breathing and certain movements.  She did have some bruising.  She had some bruising on her face as well.  No loss of consciousness.  No other injuries.  She has a known right rotator cuff tear  Date of injury 02/16/2020   Assessment  1. Primary osteoarthritis of right knee   2. Costochondritis   3. Contusion of face, initial encounter   4. Nontraumatic complete tear of right rotator cuff      Plan   Osteoarthritis knee: First of 3 Synvisc injections given.  Well-tolerated.  Routine guidance given.  Return in 1 week  Fall with costochondritis, secondary, contusions: Fall prevention discussed.  NSAIDs as needed.  Reassured.  Right rotator cuff tear persistent pain: Did not get relief from steroid injection.  Patient will schedule with orthopedics for follow-up  Follow up: Return in about 1 week (around 02/26/2020) for 2nd synvisc injection.  02/26/2020  No orders of the defined types were placed in this encounter.  No orders of the defined types were placed in this encounter.     I reviewed the patients updated PMH, FH, and SocHx.    Patient Active Problem List   Diagnosis Date Noted  . Hemorrhagic cerebrovascular accident (CVA) ([place])  04/20/2018    Priority: High  . Major depression, recurrent, chronic ([place]) 02/12/2015    Priority: High  . Coronary artery disease involving native coronary artery of native heart without angina pectoris 05/16/2012    Priority: High  . Generalized anxiety disorder 01/10/2012    Priority: High  . History of non-ST elevation myocardial infarction (NSTEMI) 12/05/2011    Priority: High  . Mixed hyperlipidemia 12/31/2010    Priority: High  . Paroxysmal atrial fibrillation ([employer]) 12/14/2010    Priority: High  . Essential hypertension 01/09/2009    Priority: High  . Subclinical hypothyroidism 03/24/2017    Priority: Medium  . DJD (degenerative joint disease), cervical 05/12/2016    Priority: Medium  . Foraminal stenosis of cervical region 08/18/2015    Priority: Medium  . Chronic pain of both knees 02/12/2015    Priority: Medium  . Zenker diverticulum 05/08/2014    Priority: Medium  . Osteopenia 06/08/2012    Priority: Medium  . Gastro-esophageal reflux disease without esophagitis 08/12/2011    Priority: Medium  . Osteoarthritis, hand 12/31/2010    Priority: Low  . Rosacea 12/31/2010    Priority: Low   No outpatient medications have been marked as taking for the 02/19/20 encounter (Office Visit) with Leamon Arnt, MD.    Allergies: Patient has No Known Allergies. Family History: Patient family history includes Alcohol abuse in her father; Atrial fibrillation  in her mother; Diabetes in her brother, father, and paternal grandmother; Early death in her father and maternal grandfather; Heart attack in her father, maternal uncle, and paternal grandmother; Kidney disease in her maternal grandmother; Stroke in her mother, paternal aunt, and son; Transient ischemic attack in her mother. Social History:  Patient  reports that she has never smoked. She has never used smokeless tobacco. She reports current alcohol use of about 2.0 - 3.0 standard drinks of alcohol per week. She reports that  she does not use drugs.  Review of Systems: Constitutional: Negative for fever malaise or anorexia Cardiovascular: negative for chest pain Respiratory: negative for SOB or persistent cough Gastrointestinal: negative for abdominal pain  Objective  Vitals: BP 114/68   Pulse 68   Temp (!) 97.5 F (36.4 C)   Resp 16   Ht 5\' 2"  (1.575 m)   Wt 147 lb (66.7 kg)   SpO2 98%   BMI 26.89 kg/m  General: no acute distress , A&Ox3 HEENT: PEERL, conjunctiva normal, neck is supple, resolving contusion right cheek Cardiovascular:  RRR without murmur or gallop.  Respiratory:  Good breath sounds bilaterally, CTAB with normal respiratory effort Skin:  Warm, no rashes Right knee: Osteoarthritic changes noted, crepitus, no erythema or warmth  Procedure Note for Injection of Synvisc 16mg /injection (8mg /ml) Dx: osteoarthritis of knee(s), right Indication: pain relief .Right Knee(s) injection(s) was/were performed as follows:  Initial universal "time out" was performed  Verbal consent was obtained regarding procedure, indications, risks and benefits   With the patient sitting on edge of bed, the knee(s) was/were cleansed with betadine and alcohol, anesthesia with ethyl chloride spray was done and using the lateral approach, Synvisc 16mg  (42ml)  was injected to the knee(s). The flow was easy into the large joint space and the patient tolerated the procedure well. There were no complications. No bleeding. The patient left the exam room in good condition.   Commons side effects, risks, benefits, and alternatives for medications and treatment plan prescribed today were discussed, and the patient expressed understanding of the given instructions. Patient is instructed to call or message via MyChart if he/she has any questions or concerns regarding our treatment plan. No barriers to understanding were identified. We discussed Red Flag symptoms and signs in detail. Patient expressed understanding regarding what  to do in case of urgent or emergency type symptoms.   Medication list was reconciled, printed and provided to the patient in AVS. Patient instructions and summary information was reviewed with the patient as documented in the AVS. This note was prepared with assistance of Dragon voice recognition software. Occasional wrong-word or sound-a-like substitutions may have occurred due to the inherent limitations of voice recognition software  This visit occurred during the SARS-CoV-2 public health emergency.  Safety protocols were in place, including screening questions prior to the visit, additional usage of staff PPE, and extensive cleaning of exam room while observing appropriate contact time as indicated for disinfecting solutions.

## 2020-02-19 NOTE — Patient Instructions (Signed)
Please return in 1 week for synvisc injection, and then 7-10 days after that for the 3rd injection.  Ice your knee tonight and tomorrow.. You may notice stiffness and increased soreness tomorrow.   If you have any questions or concerns, please don't hesitate to send me a message via MyChart or call the office at [phone]. Thank you for visiting with [place] today! It's our pleasure caring for you.

## 2020-02-20 ENCOUNTER — Encounter: Payer: Self-pay | Admitting: Family Medicine

## 2020-02-21 MED ORDER — HYLAN G-F 20 16 MG/2ML IX SOSY
16.0000 mg | PREFILLED_SYRINGE | Freq: Once | INTRA_ARTICULAR | Status: AC
  Administered 2020-02-19: 16 mg via INTRA_ARTICULAR

## 2020-02-26 ENCOUNTER — Other Ambulatory Visit: Payer: Self-pay

## 2020-02-26 ENCOUNTER — Ambulatory Visit (INDEPENDENT_AMBULATORY_CARE_PROVIDER_SITE_OTHER): Payer: Medicare Other | Admitting: Family Medicine

## 2020-02-26 ENCOUNTER — Encounter: Payer: Self-pay | Admitting: Family Medicine

## 2020-02-26 VITALS — BP 110/64 | HR 104 | Temp 97.4°F | Ht 62.0 in | Wt 148.8 lb

## 2020-02-26 DIAGNOSIS — M1711 Unilateral primary osteoarthritis, right knee: Secondary | ICD-10-CM

## 2020-02-26 MED ORDER — HYLAN G-F 20 16 MG/2ML IX SOSY
16.0000 mg | PREFILLED_SYRINGE | Freq: Once | INTRA_ARTICULAR | Status: AC
  Administered 2020-02-26: 16 mg via INTRA_ARTICULAR

## 2020-02-26 NOTE — Patient Instructions (Signed)
Please return during the week of dec 27 for your 3rd synvisc injection.  If you have any questions or concerns, please don't hesitate to send me a message via MyChart or call the office at [phone]. Thank you for visiting with [place] today! It's our pleasure caring for you.

## 2020-02-26 NOTE — Progress Notes (Signed)
Here for 2nd of 3 synvisc injections for R knee OA She tolerated the 1st well.   Procedure Note for Injection of Synvisc 16mg /injection (8mg /ml) Dx: osteoarthritis of knee(s), right Indication: pain relief .Right Knee(s) injection(s) was/were performed as follows:  Initial universal "time out" was performed  Verbal consent was obtained regarding procedure, indications, risks and benefits   With the patient sitting on edge of bed, the knee(s) was/were cleansed with betadine and alcohol, anesthesia with ethyl chloride spray was done and using the lateral approach, Synvisc 16mg  (1ml)  was injected to the knee(s). The flow was easy into the large joint space and the patient tolerated the procedure well. There were no complications. No bleeding. The patient left the exam room in good condition.

## 2020-03-09 ENCOUNTER — Encounter: Payer: Self-pay | Admitting: Family Medicine

## 2020-03-09 ENCOUNTER — Other Ambulatory Visit: Payer: Self-pay

## 2020-03-09 ENCOUNTER — Ambulatory Visit (INDEPENDENT_AMBULATORY_CARE_PROVIDER_SITE_OTHER): Payer: Medicare Other | Admitting: Family Medicine

## 2020-03-09 VITALS — BP 134/74 | HR 73 | Temp 97.1°F | Ht 62.0 in | Wt 149.0 lb

## 2020-03-09 DIAGNOSIS — M1711 Unilateral primary osteoarthritis, right knee: Secondary | ICD-10-CM

## 2020-03-09 MED ORDER — HYLAN G-F 20 16 MG/2ML IX SOSY
16.0000 mg | PREFILLED_SYRINGE | Freq: Once | INTRA_ARTICULAR | Status: AC
  Administered 2020-03-09: 16 mg via INTRA_ARTICULAR

## 2020-03-09 NOTE — Patient Instructions (Signed)
Please follow up as scheduled for your next visit with me: 04/29/2020   If you have any questions or concerns, please don't hesitate to send me a message via MyChart or call the office at [phone]. Thank you for visiting with Brittney Cochran today! It's our pleasure caring for you.

## 2020-03-09 NOTE — Progress Notes (Signed)
Here for 3rd of 3 synvisc injections for R knee OA She tolerated the 1st two doses well. Knee is improving. Less catching. Less pain. No AEs.  Procedure Note for Injection of Synvisc 16mg /injection (8mg /ml) Dx: osteoarthritis of knee(s), right Indication: pain relief .Right Knee(s) injection(s) was/were performed as follows:  Initial universal "time out" was performed  Verbal consent was obtained regarding procedure, indications, risks and benefits   With the patient sitting on edge of bed, the knee(s) was/were cleansed with betadine and alcohol, anesthesia with ethyl chloride spray was done and using the lateral approach, Synvisc 16mg  (51ml)  was injected to the knee(s). The flow was easy into the large joint space and the patient tolerated the procedure well. There were no complications. No bleeding. The patient left the exam room in good condition.

## 2020-03-10 ENCOUNTER — Other Ambulatory Visit: Payer: Medicare Other

## 2020-04-06 ENCOUNTER — Other Ambulatory Visit: Payer: Self-pay | Admitting: Family Medicine

## 2020-04-13 ENCOUNTER — Telehealth: Payer: Self-pay

## 2020-04-13 NOTE — Telephone Encounter (Signed)
.   LAST APPOINTMENT DATE: 03/09/2020   NEXT APPOINTMENT DATE:@2 /16/2022  MEDICATION:SYNTHROID 25 MCG tablet  PHARMACY: [id number] - [place], [place] - [address]  PATIENT IS REQUESTING 90 DAY SUPPLY        CLINICAL FILLS OUT ALL BELOW:   LAST REFILL:  QTY:  REFILL DATE:    OTHER COMMENTS:    Okay for refill?  Please advise

## 2020-04-14 ENCOUNTER — Other Ambulatory Visit: Payer: Self-pay

## 2020-04-14 MED ORDER — SYNTHROID 25 MCG PO TABS: ORAL_TABLET | ORAL | 1 refills | Status: DC

## 2020-04-14 NOTE — Telephone Encounter (Signed)
Refill sent to pharmacy.   

## 2020-04-29 ENCOUNTER — Ambulatory Visit: Payer: Medicare Other | Admitting: Family Medicine

## 2020-04-30 ENCOUNTER — Other Ambulatory Visit: Payer: Self-pay | Admitting: Family Medicine

## 2020-05-04 ENCOUNTER — Other Ambulatory Visit: Payer: Self-pay | Admitting: Family Medicine

## 2020-05-11 ENCOUNTER — Other Ambulatory Visit: Payer: Self-pay | Admitting: Family Medicine

## 2020-05-11 DIAGNOSIS — Z1231 Encounter for screening mammogram for malignant neoplasm of breast: Secondary | ICD-10-CM

## 2020-05-14 DIAGNOSIS — L218 Other seborrheic dermatitis: Secondary | ICD-10-CM | POA: Diagnosis not present

## 2020-05-18 DIAGNOSIS — I10 Essential (primary) hypertension: Secondary | ICD-10-CM | POA: Diagnosis not present

## 2020-05-18 DIAGNOSIS — I480 Paroxysmal atrial fibrillation: Secondary | ICD-10-CM | POA: Diagnosis not present

## 2020-05-18 DIAGNOSIS — I252 Old myocardial infarction: Secondary | ICD-10-CM | POA: Diagnosis not present

## 2020-05-18 DIAGNOSIS — I2510 Atherosclerotic heart disease of native coronary artery without angina pectoris: Secondary | ICD-10-CM | POA: Diagnosis not present

## 2020-05-18 DIAGNOSIS — R0602 Shortness of breath: Secondary | ICD-10-CM | POA: Diagnosis not present

## 2020-05-18 DIAGNOSIS — E782 Mixed hyperlipidemia: Secondary | ICD-10-CM | POA: Diagnosis not present

## 2020-05-18 DIAGNOSIS — Z9861 Coronary angioplasty status: Secondary | ICD-10-CM | POA: Diagnosis not present

## 2020-05-27 ENCOUNTER — Ambulatory Visit (INDEPENDENT_AMBULATORY_CARE_PROVIDER_SITE_OTHER): Payer: Medicare Other | Admitting: Family Medicine

## 2020-05-27 ENCOUNTER — Encounter: Payer: Self-pay | Admitting: Family Medicine

## 2020-05-27 ENCOUNTER — Other Ambulatory Visit: Payer: Self-pay

## 2020-05-27 VITALS — BP 102/60 | HR 55 | Temp 97.2°F | Ht 62.0 in | Wt 145.2 lb

## 2020-05-27 DIAGNOSIS — M8580 Other specified disorders of bone density and structure, unspecified site: Secondary | ICD-10-CM

## 2020-05-27 DIAGNOSIS — N3000 Acute cystitis without hematuria: Secondary | ICD-10-CM | POA: Diagnosis not present

## 2020-05-27 DIAGNOSIS — F411 Generalized anxiety disorder: Secondary | ICD-10-CM | POA: Diagnosis not present

## 2020-05-27 DIAGNOSIS — F339 Major depressive disorder, recurrent, unspecified: Secondary | ICD-10-CM

## 2020-05-27 DIAGNOSIS — M170 Bilateral primary osteoarthritis of knee: Secondary | ICD-10-CM

## 2020-05-27 DIAGNOSIS — I10 Essential (primary) hypertension: Secondary | ICD-10-CM

## 2020-05-27 DIAGNOSIS — Z7185 Encounter for immunization safety counseling: Secondary | ICD-10-CM | POA: Diagnosis not present

## 2020-05-27 DIAGNOSIS — S06350S Traumatic hemorrhage of left cerebrum without loss of consciousness, sequela: Secondary | ICD-10-CM | POA: Insufficient documentation

## 2020-05-27 DIAGNOSIS — R399 Unspecified symptoms and signs involving the genitourinary system: Secondary | ICD-10-CM

## 2020-05-27 DIAGNOSIS — I480 Paroxysmal atrial fibrillation: Secondary | ICD-10-CM

## 2020-05-27 LAB — POCT URINALYSIS DIPSTICK
Bilirubin, UA: NEGATIVE
Glucose, UA: NEGATIVE
Ketones, UA: NEGATIVE
Nitrite, UA: POSITIVE
Protein, UA: POSITIVE — AB
Spec Grav, UA: 1.02 (ref 1.010–1.025)
Urobilinogen, UA: 0.2 E.U./dL
pH, UA: 5.5 (ref 5.0–8.0)

## 2020-05-27 MED ORDER — CEPHALEXIN 500 MG PO CAPS: 500.0000 mg | ORAL_CAPSULE | Freq: Two times a day (BID) | ORAL | 0 refills | Status: DC

## 2020-05-27 MED ORDER — SHINGRIX 50 MCG/0.5ML IM SUSR: 0.5000 mL | Freq: Once | INTRAMUSCULAR | 0 refills | Status: AC

## 2020-05-27 NOTE — Progress Notes (Signed)
Subjective  CC:  Chief Complaint  Patient presents with   Urinary Tract Infection    Pressure, burning when urinating, frequency but when she goes to the restroom not much comes out so she tries to hold it to avoid going as often.   Depression   Hypertension    HPI: Brittney Cochran is a 80 y.o. female who presents to the office today to address the problems listed above in the chief complaint.   80 year old female with urinary tract symptoms including dysuria, urinary frequency and malaise.  She denies fevers, chills or flank pain.  No nausea or vomiting.  She endorses suprapubic pressure.  She denies lightheadedness.  Appetite is normal  Hypertension f/u: Control has been good.  Blood pressures running low today.  She takes a beta-blocker and low-dose ACE inhibitor.  Pt reports she is doing well. She denies adverse effects from his BP medications. Compliance with medication is good.   Mood disorder: Anxiety and depression are currently well controlled on high-dose sertraline.  She has been stable on this dose for a while now.  No adverse effects.  PAF without palpitations or symptoms of heart failure.  No chest pain.  Bilateral osteoarthritis knees: She did very well with her Synvisc 3 injections.  She continues to have low-grade pain.  She has end-stage right osteoarthritis.  She is requesting to see a specific provider in [facility] for further assessment and need for knee replacement.  No redness or swelling.  Assessment  1. Essential hypertension   2. Generalized anxiety disorder   3. Major depression, recurrent, chronic (HCC) Chronic  4. Paroxysmal atrial fibrillation (HCC) Chronic  5. Acute cystitis without hematuria   6. Bilateral primary osteoarthritis of knee   7. Vaccine counseling      Plan    Hypertension f/u: BP control is well controlled.  However blood pressures running a little low today.  Hold lisinopril for the next 2 days until she is  drinking normally and feels better from her UTI.  Acute cystitis: Start Keflex.  Monitor for worsening.  Hydrate.  Follow-up as needed.  Await culture results.  Mood disorder is well controlled on sertraline  PAF stable  Bilateral knee osteoarthritis: Refer to orthopedics for evaluation for knee replacement  Health maintenance: Eligible for Shingrix.  Prescription given vaccine counseling done.  Mammogram and bone density scheduled for later this year. Education regarding management of these chronic disease states was given. Management strategies discussed on successive visits include dietary and exercise recommendations, goals of achieving and maintaining IBW, and lifestyle modifications aiming for adequate sleep and minimizing stressors.   Follow up: August for complete physical  Orders Placed This Encounter  Procedures   Urine Culture   CBC with Differential/Platelet   Ambulatory referral to Orthopedic Surgery   POCT Urinalysis Dipstick   Meds ordered this encounter  Medications   Zoster Vaccine Adjuvanted [facility]) injection    Sig: Inject 0.5 mLs into the muscle once for 1 dose. Please give 2nd dose 2-6 months after first dose    Dispense:  2 each    Refill:  0   cephALEXin (KEFLEX) 500 MG capsule    Sig: Take 1 capsule (500 mg total) by mouth 2 (two) times daily.    Dispense:  14 capsule    Refill:  0      BP Readings from Last 3 Encounters:  05/27/20 102/60  03/09/20 134/74  02/26/20 110/64   Wt Readings from Last 3  Encounters:  05/27/20 145 lb 3.2 oz (65.9 kg)  03/09/20 149 lb (67.6 kg)  02/26/20 148 lb 12.8 oz (67.5 kg)    Lab Results  Component Value Date   CHOL 134 10/28/2019   CHOL 140 05/18/2017   Lab Results  Component Value Date   HDL 60 10/28/2019   HDL 80.00 05/18/2017   Lab Results  Component Value Date   LDLCALC 56 10/28/2019   LDLCALC 39 05/18/2017   Lab Results  Component Value Date   TRIG 94 10/28/2019   TRIG 104.0  05/18/2017   Lab Results  Component Value Date   CHOLHDL 2.2 10/28/2019   CHOLHDL 2 05/18/2017   No results found for: LDLDIRECT Lab Results  Component Value Date   CREATININE 0.86 10/28/2019   BUN 21 10/28/2019   NA 138 10/28/2019   K 4.8 10/28/2019   CL 103 10/28/2019   CO2 28 10/28/2019    The ASCVD Risk score (Goff DC Jr., et al., 2013) failed to calculate for the following reasons:   The 2013 ASCVD risk score is only valid for ages 53 to 10   The patient has a prior MI or stroke diagnosis  I reviewed the patients updated PMH, FH, and SocHx.    Patient Active Problem List   Diagnosis Date Noted   Hemorrhagic cerebrovascular accident (CVA) ([place]) 04/20/2018    Priority: High   Major depression, recurrent, chronic ([place]) 02/12/2015    Priority: High   Coronary artery disease involving native coronary artery of native heart without angina pectoris 05/16/2012    Priority: High   Generalized anxiety disorder 01/10/2012    Priority: High   History of non-ST elevation myocardial infarction (NSTEMI) 12/05/2011    Priority: High   Mixed hyperlipidemia 12/31/2010    Priority: High   Paroxysmal atrial fibrillation ([place]) 12/14/2010    Priority: High   Essential hypertension 01/09/2009    Priority: High   Subclinical hypothyroidism 03/24/2017    Priority: Medium   DJD (degenerative joint disease), cervical 05/12/2016    Priority: Medium   Foraminal stenosis of cervical region 08/18/2015    Priority: Medium   Chronic pain of both knees 02/12/2015    Priority: Medium   Zenker diverticulum 05/08/2014    Priority: Medium   Osteopenia 06/08/2012    Priority: Medium   Gastro-esophageal reflux disease without esophagitis 08/12/2011    Priority: Medium   Osteoarthritis, hand 12/31/2010    Priority: Low   Rosacea 12/31/2010    Priority: Low    Allergies: Patient has no known allergies.  Social History: Patient  reports that she has never smoked. She has  never used smokeless tobacco. She reports current alcohol use of about 2.0 - 3.0 standard drinks of alcohol per week. She reports that she does not use drugs.  Current Meds  Medication Sig   ALPRAZolam (XANAX) 0.5 MG tablet Take 1 tablet (0.5 mg total) by mouth daily as needed.   aspirin EC 81 MG tablet Take 81 mg by mouth daily. Swallow whole.   carvedilol (COREG) 12.5 MG tablet TK 1 T PO BID WITH MEALS   cephALEXin (KEFLEX) 500 MG capsule Take 1 capsule (500 mg total) by mouth 2 (two) times daily.   Co-Enzyme Q-10 30 MG CAPS Take by mouth.   diclofenac sodium (VOLTAREN) 1 % GEL Apply 2 g topically 4 (four) times daily. To hands as needed   fluticasone (FLONASE) 50 MCG/ACT nasal spray USE 1 SPRAY NASALLY DAILY   lisinopril (  PRINIVIL,ZESTRIL) 2.5 MG tablet Take 2.5 mg by mouth daily.    nitroGLYCERIN (NITROSTAT) 0.4 MG SL tablet Place 0.4 mg under the tongue.   omeprazole (PRILOSEC) 40 MG capsule TAKE 1 CAPSULE(40 MG) BY MOUTH DAILY   ranolazine (RANEXA) 500 MG 12 hr tablet Take 500 mg by mouth 2 (two) times daily.    rosuvastatin (CRESTOR) 40 MG tablet Take 40 mg by mouth daily.    sertraline (ZOLOFT) 100 MG tablet TAKE 1 AND 1/2 TABLETS BY MOUTH DAILY   SYNTHROID 25 MCG tablet TAKE 1 TABLET BY MOUTH DAILY BEFORE BREAKFAST   VITAMIN D, CHOLECALCIFEROL, PO Take by mouth.   [EXPIRED] Zoster Vaccine Adjuvanted [facility]) injection Inject 0.5 mLs into the muscle once for 1 dose. Please give 2nd dose 2-6 months after first dose    Review of Systems: Cardiovascular: negative for chest pain, palpitations, leg swelling, orthopnea Respiratory: negative for SOB, wheezing or persistent cough Gastrointestinal: negative for abdominal pain Genitourinary: negative for dysuria or gross hematuria  Objective  Vitals: BP 102/60    Pulse (!) 55    Temp (!) 97.2 F (36.2 C) (Temporal)    Ht 5\' 2"  (1.575 m)    Wt 145 lb 3.2 oz (65.9 kg)    SpO2 97%    BMI 26.56 kg/m  General: no acute  distress  Psych:  Alert and oriented, normal mood and affect HEENT:  Normocephalic, atraumatic, supple neck  Cardiovascular:  RRR, no edema Respiratory:  Good breath sounds bilaterally, CTAB with normal respiratory effort, no cvat Benign abdomen with mild suprapubic ttp Skin:  Warm, no rashes   Commons side effects, risks, benefits, and alternatives for medications and treatment plan prescribed today were discussed, and the patient expressed understanding of the given instructions. Patient is instructed to call or message via MyChart if he/she has any questions or concerns regarding our treatment plan. No barriers to understanding were identified. We discussed Red Flag symptoms and signs in detail. Patient expressed understanding regarding what to do in case of urgent or emergency type symptoms.   Medication list was reconciled, printed and provided to the patient in AVS. Patient instructions and summary information was reviewed with the patient as documented in the AVS. This note was prepared with assistance of Dragon voice recognition software. Occasional wrong-word or sound-a-like substitutions may have occurred due to the inherent limitations of voice recognition software  This visit occurred during the SARS-CoV-2 public health emergency.  Safety protocols were in place, including screening questions prior to the visit, additional usage of staff PPE, and extensive cleaning of exam room while observing appropriate contact time as indicated for disinfecting solutions.

## 2020-05-27 NOTE — Patient Instructions (Addendum)
Please return in august for your annual complete physical; please come fasting. Please schedule an AWV with Otila Kluver.   Please hold the lisinopril for the next day or two until you are feeling better and eating and drinking normally. Check your blood pressure at home to ensure it responds. It is low today.   BP Readings from Last 3 Encounters:  05/27/20 102/60  03/09/20 134/74  02/26/20 110/64   Please take the prescription for Shingrix to the pharmacy so they may administer the vaccinations. Your insurance will then cover the injections.   If you have any questions or concerns, please don't hesitate to send me a message via MyChart or call the office at [phone]. Thank you for visiting with [place] today! It's our pleasure caring for you.

## 2020-05-28 LAB — CBC WITH DIFFERENTIAL/PLATELET
Basophils Absolute: 0.1 10*3/uL (ref 0.0–0.1)
Basophils Relative: 0.6 % (ref 0.0–3.0)
Eosinophils Absolute: 0.1 10*3/uL (ref 0.0–0.7)
Eosinophils Relative: 1 % (ref 0.0–5.0)
HCT: 34.9 % — ABNORMAL LOW (ref 36.0–46.0)
Hemoglobin: 11.6 g/dL — ABNORMAL LOW (ref 12.0–15.0)
Lymphocytes Relative: 11.6 % — ABNORMAL LOW (ref 12.0–46.0)
Lymphs Abs: 1 10*3/uL (ref 0.7–4.0)
MCHC: 33.3 g/dL (ref 30.0–36.0)
MCV: 93 fl (ref 78.0–100.0)
Monocytes Absolute: 1.4 10*3/uL — ABNORMAL HIGH (ref 0.1–1.0)
Monocytes Relative: 16 % — ABNORMAL HIGH (ref 3.0–12.0)
Neutro Abs: 6.1 10*3/uL (ref 1.4–7.7)
Neutrophils Relative %: 70.8 % (ref 43.0–77.0)
Platelets: 141 10*3/uL — ABNORMAL LOW (ref 150.0–400.0)
RBC: 3.75 Mil/uL — ABNORMAL LOW (ref 3.87–5.11)
RDW: 14.6 % (ref 11.5–15.5)
WBC: 8.6 10*3/uL (ref 4.0–10.5)

## 2020-05-29 LAB — URINE CULTURE
MICRO NUMBER:: 11654664
SPECIMEN QUALITY:: ADEQUATE

## 2020-06-01 ENCOUNTER — Telehealth: Payer: Self-pay

## 2020-06-01 NOTE — Progress Notes (Signed)
Please call patient: I have reviewed his/her lab results. Urine is infected. Please call to see if her symptoms have improved on keflex. If they have not, let me know so I can change her antibiotic.  (keflex should be helping).

## 2020-06-01 NOTE — Telephone Encounter (Signed)
See result note.  

## 2020-06-01 NOTE — Telephone Encounter (Signed)
Patient wanted to know if CBC results were okay. Drawn same day as urine culture. Please advise

## 2020-06-01 NOTE — Telephone Encounter (Signed)
Pt returned Katie's phone call. Please advise.

## 2020-06-02 NOTE — Telephone Encounter (Signed)
Yes, cbc was ok. WBC was normal. Mild anemia.

## 2020-06-02 NOTE — Telephone Encounter (Signed)
Patient aware of lab results per PCP

## 2020-06-11 DIAGNOSIS — M25561 Pain in right knee: Secondary | ICD-10-CM | POA: Diagnosis not present

## 2020-06-11 DIAGNOSIS — M1711 Unilateral primary osteoarthritis, right knee: Secondary | ICD-10-CM | POA: Diagnosis not present

## 2020-06-26 DIAGNOSIS — H52203 Unspecified astigmatism, bilateral: Secondary | ICD-10-CM | POA: Diagnosis not present

## 2020-06-26 DIAGNOSIS — H02831 Dermatochalasis of right upper eyelid: Secondary | ICD-10-CM | POA: Diagnosis not present

## 2020-06-26 DIAGNOSIS — H524 Presbyopia: Secondary | ICD-10-CM | POA: Diagnosis not present

## 2020-06-26 DIAGNOSIS — H5213 Myopia, bilateral: Secondary | ICD-10-CM | POA: Diagnosis not present

## 2020-06-26 DIAGNOSIS — H2513 Age-related nuclear cataract, bilateral: Secondary | ICD-10-CM | POA: Diagnosis not present

## 2020-06-26 DIAGNOSIS — H43813 Vitreous degeneration, bilateral: Secondary | ICD-10-CM | POA: Diagnosis not present

## 2020-06-26 DIAGNOSIS — H25013 Cortical age-related cataract, bilateral: Secondary | ICD-10-CM | POA: Diagnosis not present

## 2020-06-26 DIAGNOSIS — H02834 Dermatochalasis of left upper eyelid: Secondary | ICD-10-CM | POA: Diagnosis not present

## 2020-07-02 ENCOUNTER — Inpatient Hospital Stay: Admission: RE | Admit: 2020-07-02 | Payer: Medicare Other | Source: Ambulatory Visit

## 2020-07-09 ENCOUNTER — Ambulatory Visit (INDEPENDENT_AMBULATORY_CARE_PROVIDER_SITE_OTHER): Payer: Medicare Other | Admitting: Family Medicine

## 2020-07-09 ENCOUNTER — Other Ambulatory Visit: Payer: Self-pay

## 2020-07-09 ENCOUNTER — Encounter: Payer: Self-pay | Admitting: Family Medicine

## 2020-07-09 ENCOUNTER — Telehealth: Payer: Self-pay

## 2020-07-09 VITALS — BP 122/76 | HR 69 | Temp 97.2°F | Resp 16 | Ht 62.0 in | Wt 147.4 lb

## 2020-07-09 DIAGNOSIS — R300 Dysuria: Secondary | ICD-10-CM | POA: Diagnosis not present

## 2020-07-09 LAB — POCT URINALYSIS DIPSTICK
Bilirubin, UA: NEGATIVE
Blood, UA: NEGATIVE
Glucose, UA: NEGATIVE
Ketones, UA: NEGATIVE
Leukocytes, UA: NEGATIVE
Nitrite, UA: NEGATIVE
Protein, UA: NEGATIVE
Spec Grav, UA: 1.015 (ref 1.010–1.025)
Urobilinogen, UA: 1 E.U./dL
pH, UA: 5.5 (ref 5.0–8.0)

## 2020-07-09 MED ORDER — CIPROFLOXACIN HCL 250 MG PO TABS: 250.0000 mg | ORAL_TABLET | Freq: Two times a day (BID) | ORAL | 0 refills | Status: AC

## 2020-07-09 NOTE — Patient Instructions (Signed)
Please follow up as scheduled for your next visit with me: 11/03/2020   If you have any questions or concerns, please don't hesitate to send me a message via MyChart or call the office at [phone]. Thank you for visiting with Brittney Cochran today! It's our pleasure caring for you.  Let me know if your symptoms do not resolve.

## 2020-07-09 NOTE — Addendum Note (Signed)
Addended by: Doran Clay A on: 07/09/2020 11:32 AM   Modules accepted: Orders

## 2020-07-09 NOTE — Telephone Encounter (Signed)
Pt called stating she told Dr. Jonni Sanger incorrectly about her appointment with Dr. Dema Severin at [facility]. Pt states her appointment is not until 07/23/2020. Pt stated she needs more pills of the Keflex sent in that will help her last through that appointment date. Pt states she has not gone to pick up the original prescription Dr. Jonni Sanger wrote for her. Please advise.

## 2020-07-09 NOTE — Progress Notes (Signed)
Subjective   CC:  Chief Complaint  Patient presents with  . Urinary Tract Infection    Possibly, burning when urinating urine has a bad odor. Just got over one UTI, thinks it could be a another UTI.    HPI: Brittney Cochran is a 80 y.o. female who presents to the office today to address the problems listed above in the chief complaint.  Patient reports dysuria and urinary frequency x 2 days.  She does not have the sensation of increased urinary pressure.  She denies fevers flank pain nausea vomiting or gross hematuria.  She had a resistant e.coli in march treated with keflex but she said her symptoms completely resolved.  She denies history of interstitial cystitis.  She denies vaginal symptoms including vaginal discharge or pelvic pain or rash or itching.   Assessment  1. Dysuria      Plan   Classic UTI sxs with nl dipstick today. cipro 250 bid x 3 days and await culture. May be partially treated UTI or recurrent infection.   Follow up:   Orders Placed This Encounter  Procedures  . POCT Urinalysis Dipstick   Meds ordered this encounter  Medications  . ciprofloxacin (CIPRO) 250 MG tablet    Sig: Take 1 tablet (250 mg total) by mouth 2 (two) times daily for 3 days.    Dispense:  6 tablet    Refill:  0      I reviewed the patients updated PMH, FH, and SocHx.    Patient Active Problem List   Diagnosis Date Noted  . Hemorrhagic cerebrovascular accident (CVA) ([place]) 04/20/2018    Priority: High  . Major depression, recurrent, chronic ([place]) 02/12/2015    Priority: High  . Coronary artery disease involving native coronary artery of native heart without angina pectoris 05/16/2012    Priority: High  . Generalized anxiety disorder 01/10/2012    Priority: High  . History of non-ST elevation myocardial infarction (NSTEMI) 12/05/2011    Priority: High  . Mixed hyperlipidemia 12/31/2010    Priority: High  . Paroxysmal atrial fibrillation ([place]) 12/14/2010    Priority: High  .  Essential hypertension 01/09/2009    Priority: High  . Subclinical hypothyroidism 03/24/2017    Priority: Medium  . DJD (degenerative joint disease), cervical 05/12/2016    Priority: Medium  . Foraminal stenosis of cervical region 08/18/2015    Priority: Medium  . Chronic pain of both knees 02/12/2015    Priority: Medium  . Zenker diverticulum 05/08/2014    Priority: Medium  . Osteopenia 06/08/2012    Priority: Medium  . Gastro-esophageal reflux disease without esophagitis 08/12/2011    Priority: Medium  . Osteoarthritis, hand 12/31/2010    Priority: Low  . Rosacea 12/31/2010    Priority: Low   Current Meds  Medication Sig  . ALPRAZolam (XANAX) 0.5 MG tablet Take 1 tablet (0.5 mg total) by mouth daily as needed.  Marland Kitchen aspirin EC 81 MG tablet Take 81 mg by mouth daily. Swallow whole.  . carvedilol (COREG) 12.5 MG tablet TK 1 T PO BID WITH MEALS  . cephALEXin (KEFLEX) 500 MG capsule Take 1 capsule (500 mg total) by mouth 2 (two) times daily.  . ciprofloxacin (CIPRO) 250 MG tablet Take 1 tablet (250 mg total) by mouth 2 (two) times daily for 3 days.  Marland Kitchen Co-Enzyme Q-10 30 MG CAPS Take by mouth.  . diclofenac sodium (VOLTAREN) 1 % GEL Apply 2 g topically 4 (four) times daily. To hands as needed  .  fluticasone (FLONASE) 50 MCG/ACT nasal spray USE 1 SPRAY NASALLY DAILY  . lisinopril (PRINIVIL,ZESTRIL) 2.5 MG tablet Take 2.5 mg by mouth daily.   . nitroGLYCERIN (NITROSTAT) 0.4 MG SL tablet Place 0.4 mg under the tongue.  Marland Kitchen omeprazole (PRILOSEC) 40 MG capsule TAKE 1 CAPSULE(40 MG) BY MOUTH DAILY  . ranolazine (RANEXA) 500 MG 12 hr tablet Take 500 mg by mouth 2 (two) times daily.   . rosuvastatin (CRESTOR) 40 MG tablet Take 40 mg by mouth daily.   . sertraline (ZOLOFT) 100 MG tablet TAKE 1 AND 1/2 TABLETS BY MOUTH DAILY  . SYNTHROID 25 MCG tablet TAKE 1 TABLET BY MOUTH DAILY BEFORE BREAKFAST  . VITAMIN D, CHOLECALCIFEROL, PO Take by mouth.    Review of Systems: Cardiovascular: negative  for chest pain Respiratory: negative for SOB or persistent cough Gastrointestinal: negative for abdominal pain Constitutional: Negative for fever malaise or anorexia  Objective  Vitals: BP 122/76   Pulse 69   Temp (!) 97.2 F (36.2 C) (Temporal)   Resp 16   Ht 5\' 2"  (1.575 m)   Wt 147 lb 6.4 oz (66.9 kg)   SpO2 97%   BMI 26.96 kg/m  General: no acute distress  Psych:  Alert and oriented, normal mood and affect Gastrointestinal: soft, flat abdomen,  NO CVAT, no suprapubic ttp   Office Visit on 07/09/2020  Component Date Value Ref Range Status  . Color, UA 07/09/2020 yellow   Final  . Clarity, UA 07/09/2020 clear   Final  . Glucose, UA 07/09/2020 Negative  Negative Final  . Bilirubin, UA 07/09/2020 negative   Final  . Ketones, UA 07/09/2020 negative   Final  . Spec Grav, UA 07/09/2020 1.015  1.010 - 1.025 Final  . Blood, UA 07/09/2020 negative   Final  . pH, UA 07/09/2020 5.5  5.0 - 8.0 Final  . Protein, UA 07/09/2020 Negative  Negative Final  . Urobilinogen, UA 07/09/2020 1.0  0.2 or 1.0 E.U./dL Final  . Nitrite, UA 07/09/2020 negative   Final  . Leukocytes, UA 07/09/2020 Negative  Negative Final     Commons side effects, risks, benefits, and alternatives for medications and treatment plan prescribed today were discussed, and the patient expressed understanding of the given instructions. Patient is instructed to call or message via MyChart if he/she has any questions or concerns regarding our treatment plan. No barriers to understanding were identified. We discussed Red Flag symptoms and signs in detail. Patient expressed understanding regarding what to do in case of urgent or emergency type symptoms.   Medication list was reconciled, printed and provided to the patient in AVS. Patient instructions and summary information was reviewed with the patient as documented in the AVS. This note was prepared with assistance of Dragon voice recognition software. Occasional wrong-word or  sound-a-like substitutions may have occurred due to the inherent limitations of voice recognition software

## 2020-07-10 NOTE — Telephone Encounter (Signed)
Left voicemail for patient to return call.

## 2020-07-10 NOTE — Telephone Encounter (Signed)
Patient returned call

## 2020-07-10 NOTE — Telephone Encounter (Signed)
Please let patient know that the treatment with keflex was purposefully for 3 days. Then I will have the culture back and let her know if anything else is needed. 3 days is good for a typical UTI. Thank.

## 2020-07-10 NOTE — Telephone Encounter (Signed)
Spoke with patient regarding medication, gave a verbal understanding. Let her know once culture is back, we will know what to do moving forward.

## 2020-07-11 LAB — URINE CULTURE
MICRO NUMBER:: 11826149
SPECIMEN QUALITY:: ADEQUATE

## 2020-07-13 NOTE — Progress Notes (Signed)
Please call patient: I have reviewed his/her lab results. Urine does show infection with e.coli. Is she feeling better? If still with symptoms, please order macrobid 100 bid x 5 days .thanks

## 2020-07-14 ENCOUNTER — Other Ambulatory Visit: Payer: Self-pay

## 2020-07-14 MED ORDER — NITROFURANTOIN MONOHYD MACRO 100 MG PO CAPS: 100.0000 mg | ORAL_CAPSULE | Freq: Two times a day (BID) | ORAL | 0 refills | Status: DC

## 2020-07-17 ENCOUNTER — Telehealth: Payer: Self-pay | Admitting: Family Medicine

## 2020-07-17 NOTE — Progress Notes (Signed)
  Chronic Care Management   Outreach Note  07/17/2020 Name: Brittney Cochran MRN: [id number] DOB: 12-13-1940  Referred by: Leamon Arnt, MD Reason for referral : No chief complaint on file.   An unsuccessful telephone outreach was attempted today. The patient was referred to the pharmacist for assistance with care management and care coordination.   Follow Up Plan:   Lauretta Grill Upstream Scheduler

## 2020-07-20 ENCOUNTER — Telehealth: Payer: Self-pay

## 2020-07-20 ENCOUNTER — Other Ambulatory Visit: Payer: Self-pay

## 2020-07-20 DIAGNOSIS — R350 Frequency of micturition: Secondary | ICD-10-CM

## 2020-07-20 DIAGNOSIS — R300 Dysuria: Secondary | ICD-10-CM

## 2020-07-20 NOTE — Telephone Encounter (Signed)
Please advise 

## 2020-07-20 NOTE — Telephone Encounter (Signed)
Patient called in today stating she does not feel that UTI has gotten much better she is still experiencing the following: Burning when urinating and for a while after Slight pressure and throbbing around urethra  She did state frequency has gotten less-she would like to know if she needs to come to have another urine culture or can we try another antibiotic-please advise

## 2020-07-20 NOTE — Telephone Encounter (Signed)
Please call and schedule lab visit for UA with micro and urine culture for tomorrow. Thanks.

## 2020-07-20 NOTE — Telephone Encounter (Signed)
Spoke with patient, scheduled her for a lab visit. Order placed

## 2020-07-21 ENCOUNTER — Other Ambulatory Visit: Payer: Medicare Other

## 2020-07-22 ENCOUNTER — Other Ambulatory Visit (INDEPENDENT_AMBULATORY_CARE_PROVIDER_SITE_OTHER): Payer: Medicare Other

## 2020-07-22 ENCOUNTER — Other Ambulatory Visit: Payer: Self-pay

## 2020-07-22 DIAGNOSIS — R350 Frequency of micturition: Secondary | ICD-10-CM | POA: Diagnosis not present

## 2020-07-22 LAB — POCT URINALYSIS DIPSTICK
Bilirubin, UA: NEGATIVE
Blood, UA: NEGATIVE
Glucose, UA: NEGATIVE
Ketones, UA: NEGATIVE
Leukocytes, UA: NEGATIVE
Nitrite, UA: NEGATIVE
Protein, UA: NEGATIVE
Spec Grav, UA: 1.025 (ref 1.010–1.025)
Urobilinogen, UA: 0.2 E.U./dL
pH, UA: 6 (ref 5.0–8.0)

## 2020-07-22 LAB — URINALYSIS, ROUTINE W REFLEX MICROSCOPIC
Bilirubin Urine: NEGATIVE
Hgb urine dipstick: NEGATIVE
Ketones, ur: NEGATIVE
Nitrite: NEGATIVE
Specific Gravity, Urine: 1.025 (ref 1.000–1.030)
Total Protein, Urine: NEGATIVE
Urine Glucose: NEGATIVE
Urobilinogen, UA: 0.2 (ref 0.0–1.0)
pH: 6 (ref 5.0–8.0)

## 2020-07-22 NOTE — Addendum Note (Signed)
Addended by: Gean Birchwood on: 07/22/2020 02:09 PM   Modules accepted: Orders

## 2020-07-22 NOTE — Addendum Note (Signed)
Addended by: Jacob Moores on: 07/22/2020 02:11 PM   Modules accepted: Orders

## 2020-07-22 NOTE — Progress Notes (Signed)
uri

## 2020-07-22 NOTE — Addendum Note (Signed)
Addended by: Jacob Moores on: 07/22/2020 02:08 PM   Modules accepted: Orders

## 2020-07-23 DIAGNOSIS — R300 Dysuria: Secondary | ICD-10-CM | POA: Diagnosis not present

## 2020-07-23 NOTE — Progress Notes (Signed)
Please call patient: I have reviewed his/her lab results. Initial testing on urine is negative. Will await culture but urine does not look infected at this point.

## 2020-07-24 LAB — URINE CULTURE
MICRO NUMBER:: 11877308
SPECIMEN QUALITY:: ADEQUATE

## 2020-07-27 ENCOUNTER — Other Ambulatory Visit: Payer: Self-pay

## 2020-07-27 DIAGNOSIS — R300 Dysuria: Secondary | ICD-10-CM

## 2020-07-27 MED ORDER — AMOXICILLIN 875 MG PO TABS: 875.0000 mg | ORAL_TABLET | Freq: Two times a day (BID) | ORAL | 0 refills | Status: AC

## 2020-07-27 NOTE — Progress Notes (Signed)
Please call patient: I have reviewed his/her lab results. Please let her know that her urine did grow out bacteria, a low amount but if still having sxs, can treat with 5 day course of amox 875 po bid. thanks

## 2020-07-31 ENCOUNTER — Other Ambulatory Visit: Payer: Self-pay | Admitting: Family Medicine

## 2020-08-14 ENCOUNTER — Telehealth: Payer: Self-pay | Admitting: Family Medicine

## 2020-08-14 NOTE — Progress Notes (Signed)
  Chronic Care Management   Outreach Note  08/14/2020 Name: Brittney Cochran MRN: [id number] DOB: 09/29/1940  Referred by: Leamon Arnt, MD Reason for referral : No chief complaint on file.   A second unsuccessful telephone outreach was attempted today. The patient was referred to pharmacist for assistance with care management and care coordination.  Follow Up Plan:   Lauretta Grill Upstream Scheduler

## 2020-08-21 ENCOUNTER — Other Ambulatory Visit: Payer: Self-pay

## 2020-08-21 ENCOUNTER — Ambulatory Visit
Admission: RE | Admit: 2020-08-21 | Discharge: 2020-08-21 | Disposition: A | Discharge status: Home / Self Care | Payer: Medicare Other | Source: Ambulatory Visit | Attending: Family Medicine | Admitting: Family Medicine

## 2020-08-21 DIAGNOSIS — Z1231 Encounter for screening mammogram for malignant neoplasm of breast: Secondary | ICD-10-CM | POA: Diagnosis not present

## 2020-10-16 ENCOUNTER — Other Ambulatory Visit: Payer: Medicare Other

## 2020-11-03 ENCOUNTER — Encounter: Payer: Medicare Other | Admitting: Family Medicine

## 2021-01-04 ENCOUNTER — Ambulatory Visit: Payer: Medicare Other | Admitting: Family Medicine

## 2021-01-05 ENCOUNTER — Other Ambulatory Visit: Payer: Self-pay

## 2021-01-05 ENCOUNTER — Ambulatory Visit (INDEPENDENT_AMBULATORY_CARE_PROVIDER_SITE_OTHER): Payer: Medicare Other | Admitting: Physician Assistant

## 2021-01-05 VITALS — BP 143/78 | HR 72 | Temp 97.7°F | Ht 62.0 in | Wt 146.2 lb

## 2021-01-05 DIAGNOSIS — R319 Hematuria, unspecified: Secondary | ICD-10-CM | POA: Diagnosis not present

## 2021-01-05 DIAGNOSIS — Z23 Encounter for immunization: Secondary | ICD-10-CM

## 2021-01-05 DIAGNOSIS — R300 Dysuria: Secondary | ICD-10-CM | POA: Diagnosis not present

## 2021-01-05 LAB — POC URINALSYSI DIPSTICK (AUTOMATED)
Bilirubin, UA: NEGATIVE
Blood, UA: NEGATIVE
Glucose, UA: NEGATIVE
Ketones, UA: NEGATIVE
Leukocytes, UA: NEGATIVE
Nitrite, UA: NEGATIVE
Protein, UA: NEGATIVE
Spec Grav, UA: 1.02 (ref 1.010–1.025)
Urobilinogen, UA: 0.2 E.U./dL
pH, UA: 5.5 (ref 5.0–8.0)

## 2021-01-05 NOTE — Progress Notes (Signed)
Subjective:    Patient ID: Brittney Cochran, female    DOB: 02-20-1941, 80 y.o.   MRN: [id number]  Chief Complaint  Patient presents with   Hematuria    X 1 week    HPI Patient is in today for possible uti x about one week. Noticed dysuria and some pink color to her urine to begin with. Started pushing water and was feeling a bit better. States she had an appointment scheduled yesterday, but her right knee gave out and she had to reschedule to today. She did not have any symptoms this morning.   She denies any abdominal pain, flank pain, fever or chills, hematuria, frequency or nocturia today.  Past Medical History:  Diagnosis Date   Atrial fibrillation [facility])    Coronary artery disease    GERD (gastroesophageal reflux disease)    endoscopy and esophageal diatation 2013 Dr. Percell Miller   Hyperlipemia    Leiomyoma of body of uterus    Long term current use of anticoagulant therapy 07/07/2015   Stopped 2020; no recurrence of PAF since ablation. Stopped due to hemorrhagic cva.    Osteopenia    Stroke [facility])    Subclinical hypothyroidism 03/24/2017   Started synthroid 25 03/2017 for increasing TSH.    Past Surgical History:  Procedure Laterality Date   ABLATION OF DYSRHYTHMIC FOCUS     BLADDER REPAIR  2011   BREAST BIOPSY Left 08/19/2013   CAROTID STENT  12/04/2011   ESOPHAGEAL DILATION  2013   Floating Kidney  1963   TUBAL LIGATION  1985    Family History  Problem Relation Age of Onset   Atrial fibrillation Mother    Transient ischemic attack Mother    Stroke Mother    Alcohol abuse Father    Diabetes Father    Early death Father    Heart attack Father    Diabetes Brother    Kidney disease Maternal Grandmother    Early death Maternal Grandfather    Diabetes Paternal Grandmother    Heart attack Paternal Grandmother    Heart attack Maternal Uncle    Stroke Son    Stroke Paternal Aunt     Social History   Tobacco Use   Smoking status: Never   Smokeless tobacco: Never   Vaping Use   Vaping Use: Never used  Substance Use Topics   Alcohol use: Yes    Alcohol/week: 2.0 - 3.0 standard drinks    Types: 2 - 3 Glasses of wine per week    Comment: Red Wine   Drug use: No     No Known Allergies  Review of Systems REFER TO HPI FOR PERTINENT POSITIVES AND NEGATIVES      Objective:     BP (!) 143/78   Pulse 72   Temp 97.7 F (36.5 C)   Ht 5\' 2"  (1.575 m)   Wt 146 lb 3.2 oz (66.3 kg)   SpO2 97%   BMI 26.74 kg/m   Wt Readings from Last 3 Encounters:  01/05/21 146 lb 3.2 oz (66.3 kg)  07/09/20 147 lb 6.4 oz (66.9 kg)  05/27/20 145 lb 3.2 oz (65.9 kg)    BP Readings from Last 3 Encounters:  01/05/21 (!) 143/78  07/09/20 122/76  05/27/20 102/60     Physical Exam Vitals and nursing note reviewed.  Constitutional:      General: She is not in acute distress.    Appearance: Normal appearance. She is not ill-appearing.  HENT:  Head: Normocephalic and atraumatic.  Cardiovascular:     Rate and Rhythm: Normal rate and regular rhythm.     Pulses: Normal pulses.     Heart sounds: Normal heart sounds.  Pulmonary:     Effort: Pulmonary effort is normal.     Breath sounds: Normal breath sounds.  Abdominal:     General: Abdomen is flat. Bowel sounds are normal.     Palpations: Abdomen is soft.     Tenderness: There is no right CVA tenderness or left CVA tenderness.  Skin:    General: Skin is warm and dry.  Neurological:     General: No focal deficit present.     Mental Status: She is alert.  Psychiatric:        Mood and Affect: Mood normal.       Assessment & Plan:   Problem List Items Addressed This Visit   None Visit Diagnoses     Dysuria    -  Primary   Relevant Orders   POCT Urinalysis Dipstick (Automated) (Completed)   Urine Culture   Need for immunization against influenza       Relevant Orders   Flu Vaccine QUAD High Dose(Fluad) (Completed)       1. Dysuria U/A performed in office today and clear. Will send urine  for culture and treat with antibiotics at that time if warranted. Increase water intake. May take AZO for symptomatic relief. Recheck sooner if fever, severe back pain, vomiting, or other acutely worsening symptoms.    2. Need for immunization against influenza Flu vaccine updated today    This note was prepared with assistance of Dragon voice recognition software. Occasional wrong-word or sound-a-like substitutions may have occurred due to the inherent limitations of voice recognition software.  Time Spent: 21 minutes of total time was spent on the date of the encounter performing the following actions: chart review prior to seeing the patient, obtaining history, performing a medically necessary exam, counseling on the treatment plan, placing orders, and documenting in our EHR.    Giles Currie M Raoul Ciano, PA-C

## 2021-01-05 NOTE — Patient Instructions (Signed)
Very good to meet you today. Your urine looks clear, but I will send this off for culture and call with your results. Keep pushing fluids.   Flu shot updated in office today.

## 2021-01-07 ENCOUNTER — Other Ambulatory Visit: Payer: Self-pay | Admitting: Physician Assistant

## 2021-01-07 LAB — URINE CULTURE
MICRO NUMBER:: 12547969
SPECIMEN QUALITY:: ADEQUATE

## 2021-01-07 MED ORDER — SULFAMETHOXAZOLE-TRIMETHOPRIM 800-160 MG PO TABS: 1.0000 | ORAL_TABLET | Freq: Two times a day (BID) | ORAL | 0 refills | Status: AC

## 2021-01-08 ENCOUNTER — Telehealth: Payer: Self-pay

## 2021-01-08 NOTE — Telephone Encounter (Signed)
Spoke with patient, gave her urine culture results. Gave a verbal understanding

## 2021-01-08 NOTE — Telephone Encounter (Signed)
Pt called regarding lab results. Please Advise.

## 2021-01-11 ENCOUNTER — Telehealth: Payer: Self-pay

## 2021-01-11 NOTE — Telephone Encounter (Signed)
Patient is returning a call from [facility] about lab results, she was already given the results and picked up the medication.

## 2021-01-12 NOTE — Telephone Encounter (Signed)
See result notes. 

## 2021-01-18 ENCOUNTER — Encounter: Payer: Self-pay | Admitting: Family Medicine

## 2021-01-18 ENCOUNTER — Other Ambulatory Visit: Payer: Self-pay

## 2021-01-18 ENCOUNTER — Ambulatory Visit (INDEPENDENT_AMBULATORY_CARE_PROVIDER_SITE_OTHER): Payer: Medicare Other | Admitting: Family Medicine

## 2021-01-18 VITALS — BP 104/70 | HR 86 | Temp 97.8°F | Ht 62.0 in | Wt 144.2 lb

## 2021-01-18 DIAGNOSIS — M1711 Unilateral primary osteoarthritis, right knee: Secondary | ICD-10-CM

## 2021-01-18 DIAGNOSIS — N390 Urinary tract infection, site not specified: Secondary | ICD-10-CM

## 2021-01-18 DIAGNOSIS — R202 Paresthesia of skin: Secondary | ICD-10-CM

## 2021-01-18 DIAGNOSIS — B9689 Other specified bacterial agents as the cause of diseases classified elsewhere: Secondary | ICD-10-CM

## 2021-01-18 DIAGNOSIS — I10 Essential (primary) hypertension: Secondary | ICD-10-CM

## 2021-01-18 DIAGNOSIS — L989 Disorder of the skin and subcutaneous tissue, unspecified: Secondary | ICD-10-CM | POA: Diagnosis not present

## 2021-01-18 DIAGNOSIS — E038 Other specified hypothyroidism: Secondary | ICD-10-CM

## 2021-01-18 DIAGNOSIS — R5383 Other fatigue: Secondary | ICD-10-CM | POA: Diagnosis not present

## 2021-01-18 NOTE — Patient Instructions (Signed)
Please follow up as scheduled for your next visit with me: 03/29/2021   I will release your lab results to you on your MyChart account with further instructions. Please reply with any questions.    Make an appointment with your dermatologist to look at the skin lesion; it may need to be removed.   If you have any questions or concerns, please don't hesitate to send me a message via MyChart or call the office at [phone]. Thank you for visiting with [place] today! It's our pleasure caring for you.

## 2021-01-18 NOTE — Progress Notes (Signed)
Subjective  CC:  Chief Complaint  Patient presents with   Foot Injury    Burning sensation, ongoing for a few months   Joint Swelling    Ankle swelling occasionally    HPI: Brittney Cochran is a 80 y.o. female who presents to the office today to address the problems listed above in the chief complaint. Klebsiella UTI recently treated with Septra.  She completed her antibiotics about 2 to 3 days ago.  She complains of mild dysuria this morning.  This is her second urinary tract infection this year.  Has no history of recurrent UTIs.  Does report her first UTI occurring after an episode of fecal incontinence.  Denies constipation or further problems with that. Has noticed a skin lesion on her upper left shoulder: Was using an old prescription of Lidex cream on it.  Not painful.  Does not itch.  Has, quickly over the last few weeks Complains of feeling fatigued.  No exertional chest pain or shortness of breath.  Sleeping fairly well.  Mood is good.  Just feels a little bit more worn out than usual.  Would like labs checked. Blood pressure is controlled on medications.  No lightheadedness. History of subclinical hypothyroidism.  Would like this rechecked. Will see orthopedic surgeon to discuss right knee replacement due to end-stage osteoarthritis. Complains of paresthesias both feet intermittently, feels cold and hot.  Not painful.  Assessment  1. Urinary tract infection due to Klebsiella species   2. Skin lesion of back   3. Other fatigue   4. Essential hypertension   5. Subclinical hypothyroidism   6. Primary osteoarthritis of right knee   7. Paresthesia of both feet      Plan  UTI due to Klebsiella: Was treated appropriately.  We will recheck urine culture today. Skin lesion: Possible granuloma annulare versus basal cell carcinoma.  Patient does have a dermatologist.  She will schedule to get it checked and possibly removed if needed. Fatigue: Start work-up.  Question  deconditioning. Hypertension is controlled.  Monitor for lows orthostatic symptoms. Recheck thyroid levels Agree with knee replacement given chronic pain.  She has failed conservative measures. Paresthesias: Mild and intermittent.  Will monitor for now.  Check vitamin D levels and thyroid.  Normal pulses bilateral lower extremities today.  She does have foraminal stenosis and osteoarthritis of the upper spine.  Could be related to her back.  Will monitor  Follow up: Return for as scheduled.  02/11/2021  Orders Placed This Encounter  Procedures   Urine Culture   CBC with Differential/Platelet   Comprehensive metabolic panel   TSH   VITAMIN D 25 Hydroxy (Vit-D Deficiency, Fractures)   T3   T4, free   No orders of the defined types were placed in this encounter.     I reviewed the patients updated PMH, FH, and SocHx.    Patient Active Problem List   Diagnosis Date Noted   Hemorrhagic cerebrovascular accident (CVA) ([place]) 04/20/2018    Priority: High   Major depression, recurrent, chronic ([place]) 02/12/2015    Priority: High   Coronary artery disease involving native coronary artery of native heart without angina pectoris 05/16/2012    Priority: High   Generalized anxiety disorder 01/10/2012    Priority: High   History of non-ST elevation myocardial infarction (NSTEMI) 12/05/2011    Priority: High   Mixed hyperlipidemia 12/31/2010    Priority: High   Paroxysmal atrial fibrillation ([place]) 12/14/2010    Priority: High  Essential hypertension 01/09/2009    Priority: High   Subclinical hypothyroidism 03/24/2017    Priority: Medium    DJD (degenerative joint disease), cervical 05/12/2016    Priority: Medium    Foraminal stenosis of cervical region 08/18/2015    Priority: Medium    Chronic pain of both knees 02/12/2015    Priority: Medium    Zenker diverticulum 05/08/2014    Priority: Medium    Osteopenia 06/08/2012    Priority: Medium    Gastro-esophageal reflux disease  without esophagitis 08/12/2011    Priority: Medium    Osteoarthritis, hand 12/31/2010    Priority: Low   Rosacea 12/31/2010    Priority: Low   Current Meds  Medication Sig   ALPRAZolam (XANAX) 0.5 MG tablet Take 1 tablet (0.5 mg total) by mouth daily as needed.   aspirin EC 81 MG tablet Take 81 mg by mouth daily. Swallow whole.   carvedilol (COREG) 12.5 MG tablet TK 1 T PO BID WITH MEALS   Co-Enzyme Q-10 30 MG CAPS Take by mouth.   diclofenac sodium (VOLTAREN) 1 % GEL Apply 2 g topically 4 (four) times daily. To hands as needed   fluticasone (FLONASE) 50 MCG/ACT nasal spray USE 1 SPRAY NASALLY DAILY   lisinopril (PRINIVIL,ZESTRIL) 2.5 MG tablet Take 2.5 mg by mouth daily.    nitroGLYCERIN (NITROSTAT) 0.4 MG SL tablet Place 0.4 mg under the tongue.   omeprazole (PRILOSEC) 40 MG capsule TAKE 1 CAPSULE(40 MG) BY MOUTH DAILY   rosuvastatin (CRESTOR) 40 MG tablet Take 40 mg by mouth daily.    sertraline (ZOLOFT) 100 MG tablet TAKE 1 AND 1/2 TABLETS BY MOUTH DAILY   SYNTHROID 25 MCG tablet TAKE 1 TABLET BY MOUTH DAILY BEFORE BREAKFAST   VITAMIN D, CHOLECALCIFEROL, PO Take by mouth.    Allergies: Patient has No Known Allergies. Family History: Patient family history includes Alcohol abuse in her father; Atrial fibrillation in her mother; Diabetes in her brother, father, and paternal grandmother; Early death in her father and maternal grandfather; Heart attack in her father, maternal uncle, and paternal grandmother; Kidney disease in her maternal grandmother; Stroke in her mother, paternal aunt, and son; Transient ischemic attack in her mother. Social History:  Patient  reports that she has never smoked. She has never used smokeless tobacco. She reports current alcohol use of about 2.0 - 3.0 standard drinks per week. She reports that she does not use drugs.  Review of Systems: Constitutional: Negative for fever malaise or anorexia Cardiovascular: negative for chest pain Respiratory:  negative for SOB or persistent cough Gastrointestinal: negative for abdominal pain  Objective  Vitals: BP 104/70   Pulse 86   Temp 97.8 F (36.6 C) (Temporal)   Ht 5\' 2"  (1.575 m)   Wt 144 lb 3.2 oz (65.4 kg)   SpO2 97%   BMI 26.37 kg/m  General: no acute distress , A&Ox3 Lower extremities: No edema +2 distal pulses Skin:  Warm, no rashes, left upper back with approximately 0.8 cm raised nodular lesion, erythematous and irritated.  Central pit noted.  Shiny    Commons side effects, risks, benefits, and alternatives for medications and treatment plan prescribed today were discussed, and the patient expressed understanding of the given instructions. Patient is instructed to call or message via MyChart if he/she has any questions or concerns regarding our treatment plan. No barriers to understanding were identified. We discussed Red Flag symptoms and signs in detail. Patient expressed understanding regarding what to do in case of  urgent or emergency type symptoms.  Medication list was reconciled, printed and provided to the patient in AVS. Patient instructions and summary information was reviewed with the patient as documented in the AVS. This note was prepared with assistance of Dragon voice recognition software. Occasional wrong-word or sound-a-like substitutions may have occurred due to the inherent limitations of voice recognition software  This visit occurred during the SARS-CoV-2 public health emergency.  Safety protocols were in place, including screening questions prior to the visit, additional usage of staff PPE, and extensive cleaning of exam room while observing appropriate contact time as indicated for disinfecting solutions.

## 2021-01-19 LAB — TSH: TSH: 4.54 u[IU]/mL (ref 0.35–5.50)

## 2021-01-19 LAB — COMPREHENSIVE METABOLIC PANEL
ALT: 38 U/L — ABNORMAL HIGH (ref 0–35)
AST: 41 U/L — ABNORMAL HIGH (ref 0–37)
Albumin: 4.3 g/dL (ref 3.5–5.2)
Alkaline Phosphatase: 89 U/L (ref 39–117)
BUN: 29 mg/dL — ABNORMAL HIGH (ref 6–23)
CO2: 27 mEq/L (ref 19–32)
Calcium: 9.7 mg/dL (ref 8.4–10.5)
Chloride: 103 mEq/L (ref 96–112)
Creatinine, Ser: 1.02 mg/dL (ref 0.40–1.20)
GFR: 51.54 mL/min — ABNORMAL LOW (ref >60.00)
Glucose, Bld: 97 mg/dL (ref 70–99)
Potassium: 4.6 mEq/L (ref 3.5–5.1)
Sodium: 138 mEq/L (ref 135–145)
Total Bilirubin: 0.5 mg/dL (ref 0.2–1.2)
Total Protein: 6.4 g/dL (ref 6.0–8.3)

## 2021-01-19 LAB — CBC WITH DIFFERENTIAL/PLATELET
Basophils Absolute: 0 10*3/uL (ref 0.0–0.1)
Basophils Relative: 0.4 % (ref 0.0–3.0)
Eosinophils Absolute: 0.1 10*3/uL (ref 0.0–0.7)
Eosinophils Relative: 1.9 % (ref 0.0–5.0)
HCT: 39.2 % (ref 36.0–46.0)
Hemoglobin: 12.6 g/dL (ref 12.0–15.0)
Lymphocytes Relative: 22.3 % (ref 12.0–46.0)
Lymphs Abs: 1.4 10*3/uL (ref 0.7–4.0)
MCHC: 32.2 g/dL (ref 30.0–36.0)
MCV: 89.3 fl (ref 78.0–100.0)
Monocytes Absolute: 0.9 10*3/uL (ref 0.1–1.0)
Monocytes Relative: 14.4 % — ABNORMAL HIGH (ref 3.0–12.0)
Neutro Abs: 3.9 10*3/uL (ref 1.4–7.7)
Neutrophils Relative %: 61 % (ref 43.0–77.0)
Platelets: 149 10*3/uL — ABNORMAL LOW (ref 150.0–400.0)
RBC: 4.39 Mil/uL (ref 3.87–5.11)
RDW: 13.8 % (ref 11.5–15.5)
WBC: 6.5 10*3/uL (ref 4.0–10.5)

## 2021-01-19 LAB — VITAMIN D 25 HYDROXY (VIT D DEFICIENCY, FRACTURES): VITD: 53.75 ng/mL (ref 30.00–100.00)

## 2021-01-19 LAB — T4, FREE: Free T4: 0.73 ng/dL (ref 0.60–1.60)

## 2021-01-20 LAB — URINE CULTURE
MICRO NUMBER:: 12602815
SPECIMEN QUALITY:: ADEQUATE

## 2021-01-20 LAB — T3

## 2021-01-21 DIAGNOSIS — C44629 Squamous cell carcinoma of skin of left upper limb, including shoulder: Secondary | ICD-10-CM | POA: Diagnosis not present

## 2021-01-21 DIAGNOSIS — L718 Other rosacea: Secondary | ICD-10-CM | POA: Diagnosis not present

## 2021-01-25 ENCOUNTER — Other Ambulatory Visit: Payer: Self-pay | Admitting: Family Medicine

## 2021-01-26 ENCOUNTER — Other Ambulatory Visit: Payer: Self-pay

## 2021-01-26 MED ORDER — SYNTHROID 25 MCG PO TABS: ORAL_TABLET | ORAL | 1 refills | Status: DC

## 2021-01-26 NOTE — Progress Notes (Signed)
Please call patient: I have reviewed his/her lab results. Thyroid tests are normal now.  Urine is negative. And other lab work do not show a cause for her numbness/tingling.  Her liver tests are just above normal and I will monitor these.  No worrisome findings are present.

## 2021-02-11 ENCOUNTER — Ambulatory Visit: Payer: Medicare Other

## 2021-03-16 ENCOUNTER — Other Ambulatory Visit: Payer: Self-pay | Admitting: Family Medicine

## 2021-03-29 ENCOUNTER — Encounter: Payer: Medicare Other | Admitting: Family Medicine

## 2021-04-12 ENCOUNTER — Other Ambulatory Visit: Payer: Self-pay

## 2021-04-12 ENCOUNTER — Encounter: Payer: Self-pay | Admitting: Family Medicine

## 2021-04-12 ENCOUNTER — Ambulatory Visit (INDEPENDENT_AMBULATORY_CARE_PROVIDER_SITE_OTHER): Payer: Medicare Other | Admitting: Family Medicine

## 2021-04-12 VITALS — BP 123/85 | HR 86 | Temp 97.3°F | Ht 62.0 in | Wt 146.2 lb

## 2021-04-12 DIAGNOSIS — Z0000 Encounter for general adult medical examination without abnormal findings: Secondary | ICD-10-CM

## 2021-04-12 DIAGNOSIS — I480 Paroxysmal atrial fibrillation: Secondary | ICD-10-CM | POA: Diagnosis not present

## 2021-04-12 DIAGNOSIS — F411 Generalized anxiety disorder: Secondary | ICD-10-CM

## 2021-04-12 DIAGNOSIS — F339 Major depressive disorder, recurrent, unspecified: Secondary | ICD-10-CM

## 2021-04-12 DIAGNOSIS — E038 Other specified hypothyroidism: Secondary | ICD-10-CM | POA: Diagnosis not present

## 2021-04-12 DIAGNOSIS — I10 Essential (primary) hypertension: Secondary | ICD-10-CM

## 2021-04-12 DIAGNOSIS — Z780 Asymptomatic menopausal state: Secondary | ICD-10-CM | POA: Diagnosis not present

## 2021-04-12 DIAGNOSIS — E782 Mixed hyperlipidemia: Secondary | ICD-10-CM | POA: Diagnosis not present

## 2021-04-12 DIAGNOSIS — M8580 Other specified disorders of bone density and structure, unspecified site: Secondary | ICD-10-CM

## 2021-04-12 LAB — T3: T3, Total: 107 ng/dL (ref 76–181)

## 2021-04-12 MED ORDER — SHINGRIX 50 MCG/0.5ML IM SUSR: 0.5000 mL | Freq: Once | INTRAMUSCULAR | 0 refills | Status: AC

## 2021-04-12 MED ORDER — FLUTICASONE PROPIONATE 50 MCG/ACT NA SUSP: NASAL | 3 refills | Status: AC

## 2021-04-12 MED ORDER — ALPRAZOLAM 0.5 MG PO TABS: 0.5000 mg | ORAL_TABLET | Freq: Every day | ORAL | 0 refills | Status: DC | PRN

## 2021-04-12 NOTE — Patient Instructions (Signed)
Please return in 6 months for hypertension follow up.   I will release your lab results to you on your MyChart account with further instructions. Please reply with any questions.    Best of luck with your knee surgery!  If you have any questions or concerns, please don't hesitate to send me a message via MyChart or call the office at [phone]. Thank you for visiting with [place] today! It's our pleasure caring for you.   Please take the prescription for Shingrix to the pharmacy so they may administer the vaccinations. Your insurance will then cover the injections.   I have ordered a mammogram and/or bone density for you as we discussed today: []   Mammogram  [x]   Bone Density  Please call the office checked below to schedule your appointment:  [x]   [facility]      [address] [place], [place]         []   [facility]  [address] [place], [place]

## 2021-04-12 NOTE — Progress Notes (Signed)
Subjective  Chief Complaint  Patient presents with   Annual Exam   Hypertension   Hyperlipidemia   Atrial Fibrillation   Coronary Artery Disease    HPI: Brittney Cochran is a 81 y.o. female who presents to [facility] today for a Female Wellness Visit. She also has the concerns and/or needs as listed above in the chief complaint. These will be addressed in addition to the Health Maintenance Visit.   Wellness Visit: annual visit with health maintenance review and exam without Pap  HM: Mammogram is current.  Overall she is doing well.  However he has end-stage right knee arthritis and is scheduled for right knee replacement in March.  She is eligible for Shingrix.  Chronic disease f/u and/or acute problem visit: (deemed necessary to be done in addition to the wellness visit): Depression and anxiety: Her mood is well controlled.  She says she is in a good place.  She remains on sertraline 150 mg daily.  This controls her mood well.  She is requesting a refill on Xanax she has had 30 pills that has lasted her from 2021.  Rare use. Hypertension with history of CAD and PAF: Sees cardiology annually.  Has upcoming cardiac clearance appointment scheduled.  Denies chest pain, lower extremity edema, palpitations.  She remains on aspirin daily.  No long-term anticoagulation due to history of hemorrhagic CVA.  Remains on carvedilol 12.5 mg twice daily.  Blood pressures controlled with that in addition to low-dose lisinopril. Hyperlipidemia: Has been well controlled on rosuvastatin 40 mg nightly.  She is nonfasting for recheck today.  Tolerates well. Has history of subclinical hypothyroidism for which we started levothyroxine 25 mcg last year.  However she stopped taking this in October.  She reports since her blood work was normal she thought she no longer needed it.  She feels well clinically. Osteopenia with last bone density done in 2019.  Overdue for recheck.  Assessment   1. Annual physical exam   2. Major depression, recurrent, chronic (HCC) Chronic  3. Paroxysmal atrial fibrillation (HCC) Chronic  4. Essential hypertension   5. Generalized anxiety disorder   6. Mixed hyperlipidemia   7. Subclinical hypothyroidism   8. Osteopenia, unspecified location   9. Asymptomatic menopausal state      Plan  Female Wellness Visit: Age appropriate Health Maintenance and Prevention measures were discussed with patient. Included topics are cancer screening recommendations, ways to keep healthy (see AVS) including dietary and exercise recommendations, regular eye and dental care, use of seat belts, and avoidance of moderate alcohol use and tobacco use.  BMI: discussed patient's BMI and encouraged positive lifestyle modifications to help get to or maintain a target BMI. HM needs and immunizations were addressed and ordered. See below for orders. See HM and immunization section for updates.  Again discussed Shingrix.  Prescription given to patient Routine labs and screening tests ordered including cmp, cbc and lipids where appropriate. Discussed recommendations regarding Vit D and calcium supplementation (see AVS)  Chronic disease management visit and/or acute problem visit: Depression anxiety are well controlled.  Continue sertraline 150 mg daily and Xanax prescription refilled for rare use.  She understands risk versus benefits Hypertension is well controlled.  PAF and CAD remains stable.  Continue aspirin, beta-blocker and lisinopril.  See doses as noted above. Recheck thyroid levels.  Will consider restarting levothyroxine if indicated.  Clinically euthyroid Preoperative clearance for right knee replacement: Increased risk due to comorbidities. Osteopenia: Ordered bone.  Continue calcium and vitamin D.  Follow up: 6 months for follow-up hypertension and recheck. Orders Placed This Encounter  Procedures   DG Bone Density   CBC with Differential/Platelet    Comprehensive metabolic panel   Lipid panel   TSH   T3   T4, free   Meds ordered this encounter  Medications   Zoster Vaccine Adjuvanted [facility]) injection    Sig: Inject 0.5 mLs into the muscle once for 1 dose. Please give 2nd dose 2-6 months after first dose    Dispense:  2 each    Refill:  0   fluticasone (FLONASE) 50 MCG/ACT nasal spray    Sig: USE 1 SPRAY NASALLY DAILY    Dispense:  16 g    Refill:  3   ALPRAZolam (XANAX) 0.5 MG tablet    Sig: Take 1 tablet (0.5 mg total) by mouth daily as needed.    Dispense:  30 tablet    Refill:  0      Body mass index is 26.74 kg/m. Wt Readings from Last 3 Encounters:  04/12/21 146 lb 3.2 oz (66.3 kg)  01/18/21 144 lb 3.2 oz (65.4 kg)  01/05/21 146 lb 3.2 oz (66.3 kg)     Patient Active Problem List   Diagnosis Date Noted   Hemorrhagic cerebrovascular accident (CVA) ([place]) 04/20/2018    Priority: High    Left frontal lobe by MRI 03/2018; Dr. Saintclair Halsted NS Secondary subdural hematoma    Major depression, recurrent, chronic ([place]) 02/12/2015    Priority: High   Coronary artery disease involving native coronary artery of native heart without angina pectoris 05/16/2012    Priority: High    Cardiac MRI - EF normalized to 55% from 40%, no wall motion abnormalities nor scarring For lexiscan stress test 07/2013 - unremarkable/cla     Generalized anxiety disorder 01/10/2012    Priority: High    Triggers are winter months, snow, darkness, family problems: has panic sxs. Controlled on prn xanax and zolft.     History of non-ST elevation myocardial infarction (NSTEMI) 12/05/2011    Priority: High    Overview:  12/02/11 - [facility], s/p PTCA RCA, nonSTEMI EF 60%    Mixed hyperlipidemia 12/31/2010    Priority: High    Overview:  Goal LDL < 80; cards managing    Paroxysmal atrial fibrillation ([place]) 12/14/2010    Priority: High    No recent paf; no longer on anticoagulation per cards due to h/o cva hemorrhagic Heart doc  said ok to use celebrex     Essential hypertension 01/09/2009    Priority: High   Subclinical hypothyroidism 03/24/2017    Priority: Medium     Started synthroid 25 03/2017 for increasing TSH.    DJD (degenerative joint disease), cervical 05/12/2016    Priority: Medium    Foraminal stenosis of cervical region 08/18/2015    Priority: Medium     Overview:  xrays 2017; multilevel DJD    Chronic pain of both knees 02/12/2015    Priority: Medium    Zenker diverticulum 05/08/2014    Priority: Medium     Overview:  By upper GI study.  GI is following.  Patient hoping to avoid surgical repair.  2016    Osteopenia 06/08/2012    Priority: Medium     Overview:  DEXA 2016 T=-0.2 lumbar spine, -1.6 at left femur. Stable 2016, mild worsening Dexa 07/2017: stable at femur; osteopenia. Recheck 2 years.     Gastro-esophageal reflux disease without  esophagitis 08/12/2011    Priority: Medium    Osteoarthritis, hand 12/31/2010    Priority: Low   Rosacea 12/31/2010    Priority: [place] Maintenance  Topic Date Due   Zoster Vaccines- Shingrix (1 of 2) Never done   COVID-19 Vaccine (3 - Moderna risk series) 06/05/2019   DEXA SCAN  08/02/2019   MAMMOGRAM  08/21/2021   Pneumonia Vaccine 21+ Years old  Completed   INFLUENZA VACCINE  Completed   HPV VACCINES  Aged Out   Immunization History  Administered Date(s) Administered   Fluad Quad(high Dose 65+) 11/29/2018, 01/08/2020, 01/05/2021   Influenza Split 01/11/2007, 02/04/2008, 01/01/2009, 04/19/2010, 12/14/2010, 01/12/2011   Influenza, High Dose Seasonal PF 11/29/2011, 12/18/2012, 12/10/2013, 02/02/2015, 12/16/2016, 12/01/2017   Influenza, Seasonal, Injecte, Preservative Fre 03/14/2009, 12/08/2015   Influenza,inj,quad, With Preservative 03/14/2009   Influenza-Unspecified 03/14/2009   Moderna Sars-Covid-2 Vaccination 04/12/2019, 05/08/2019   Pneumococcal Conjugate-13 04/08/2014   Pneumococcal Polysaccharide-23 01/01/2009, 03/14/2009    Pneumococcal-Unspecified 03/14/2009   Tdap 03/14/2004, 03/14/2006   We updated and reviewed the patient's past history in detail and it is documented below. Allergies: Patient has No Known Allergies. Past Medical History Patient  has a past medical history of Atrial fibrillation ([place]), Coronary artery disease, GERD (gastroesophageal reflux disease), Hyperlipemia, Leiomyoma of body of uterus, Long term current use of anticoagulant therapy (07/07/2015), Osteopenia, Stroke ([place]), and Subclinical hypothyroidism (03/24/2017). Past Surgical History Patient  has a past surgical history that includes Breast biopsy (Left, 08/19/2013); Ablation of dysrhythmic focus; Bladder repair (2011); Tubal ligation (1985); Floating Kidney [phone]); Carotid stent (12/04/2011); and Esophageal dilation (2013). Family History: Patient family history includes Alcohol abuse in her father; Atrial fibrillation in her mother; Diabetes in her brother, father, and paternal grandmother; Early death in her father and maternal grandfather; Heart attack in her father, maternal uncle, and paternal grandmother; Kidney disease in her maternal grandmother; Stroke in her mother, paternal aunt, and son; Transient ischemic attack in her mother. Social History:  Patient  reports that she has never smoked. She has never used smokeless tobacco. She reports current alcohol use of about 2.0 - 3.0 standard drinks per week. She reports that she does not use drugs.  Review of Systems: Constitutional: negative for fever or malaise Ophthalmic: negative for photophobia, double vision or loss of vision Cardiovascular: negative for chest pain, dyspnea on exertion, or new LE swelling Respiratory: negative for SOB or persistent cough Gastrointestinal: negative for abdominal pain, change in bowel habits or melena Genitourinary: negative for dysuria or gross hematuria, no abnormal uterine bleeding or disharge Musculoskeletal: negative for new gait  disturbance or muscular weakness Integumentary: negative for new or persistent rashes, no breast lumps Neurological: negative for TIA or stroke symptoms Psychiatric: negative for SI or delusions Allergic/Immunologic: negative for hives  Patient Care Team    Relationship Specialty Notifications Start End  Leamon Arnt, MD PCP - General Family Medicine  03/21/17   Kary Kos, MD Consulting Physician Neurosurgery  03/26/18   Vickii Chafe., DO Referring Physician Cardiology  03/26/18   Ebbie Ridge, MD Referring Physician Cardiology  03/26/18     Objective  Vitals: BP 123/85 (BP Location: Right Arm, Patient Position: Sitting, Cuff Size: Normal)    Pulse 86    Temp (!) 97.3 F (36.3 C) (Temporal)    Ht 5\' 2"  (1.575 m)    Wt 146 lb 3.2 oz (66.3 kg)    SpO2 96%    BMI 26.74 kg/m  General:  Well  developed, well nourished, no acute distress  Psych:  Alert and orientedx3,normal mood and affect HEENT:  Normocephalic, atraumatic, non-icteric sclera,  supple neck without adenopathy, mass or thyromegaly Cardiovascular:  Normal S1, S2, RRR without gallop, rub or murmur Respiratory:  Good breath sounds bilaterally, CTAB with normal respiratory effort Gastrointestinal: normal bowel sounds, soft, non-tender, no noted masses. No HSM MSK: no deformities, contusions. Joints are without erythema or swelling.  Skin:  Warm, no rashes or suspicious lesions noted   Commons side effects, risks, benefits, and alternatives for medications and treatment plan prescribed today were discussed, and the patient expressed understanding of the given instructions. Patient is instructed to call or message via MyChart if he/she has any questions or concerns regarding our treatment plan. No barriers to understanding were identified. We discussed Red Flag symptoms and signs in detail. Patient expressed understanding regarding what to do in case of urgent or emergency type symptoms.  Medication list was reconciled, printed  and provided to the patient in AVS. Patient instructions and summary information was reviewed with the patient as documented in the AVS. This note was prepared with assistance of Dragon voice recognition software. Occasional wrong-word or sound-a-like substitutions may have occurred due to the inherent limitations of voice recognition software  This visit occurred during the SARS-CoV-2 public health emergency.  Safety protocols were in place, including screening questions prior to the visit, additional usage of staff PPE, and extensive cleaning of exam room while observing appropriate contact time as indicated for disinfecting solutions.

## 2021-04-13 DIAGNOSIS — H6121 Impacted cerumen, right ear: Secondary | ICD-10-CM | POA: Diagnosis not present

## 2021-04-13 DIAGNOSIS — H903 Sensorineural hearing loss, bilateral: Secondary | ICD-10-CM | POA: Diagnosis not present

## 2021-04-13 LAB — T4, FREE: Free T4: 0.67 ng/dL (ref 0.60–1.60)

## 2021-04-13 LAB — COMPREHENSIVE METABOLIC PANEL
ALT: 16 U/L (ref 0–35)
AST: 26 U/L (ref 0–37)
Albumin: 4.4 g/dL (ref 3.5–5.2)
Alkaline Phosphatase: 72 U/L (ref 39–117)
BUN: 26 mg/dL — ABNORMAL HIGH (ref 6–23)
CO2: 32 mEq/L (ref 19–32)
Calcium: 9.8 mg/dL (ref 8.4–10.5)
Chloride: 102 mEq/L (ref 96–112)
Creatinine, Ser: 0.97 mg/dL (ref 0.40–1.20)
GFR: 54.65 mL/min — ABNORMAL LOW (ref >60.00)
Glucose, Bld: 102 mg/dL — ABNORMAL HIGH (ref 70–99)
Potassium: 4.9 mEq/L (ref 3.5–5.1)
Sodium: 140 mEq/L (ref 135–145)
Total Bilirubin: 0.6 mg/dL (ref 0.2–1.2)
Total Protein: 6.4 g/dL (ref 6.0–8.3)

## 2021-04-13 LAB — CBC WITH DIFFERENTIAL/PLATELET
Basophils Absolute: 0 10*3/uL (ref 0.0–0.1)
Basophils Relative: 0.6 % (ref 0.0–3.0)
Eosinophils Absolute: 0.1 10*3/uL (ref 0.0–0.7)
Eosinophils Relative: 1.2 % (ref 0.0–5.0)
HCT: 40.6 % (ref 36.0–46.0)
Hemoglobin: 13 g/dL (ref 12.0–15.0)
Lymphocytes Relative: 18.4 % (ref 12.0–46.0)
Lymphs Abs: 1.2 10*3/uL (ref 0.7–4.0)
MCHC: 31.9 g/dL (ref 30.0–36.0)
MCV: 90.4 fl (ref 78.0–100.0)
Monocytes Absolute: 0.9 10*3/uL (ref 0.1–1.0)
Monocytes Relative: 13.9 % — ABNORMAL HIGH (ref 3.0–12.0)
Neutro Abs: 4.2 10*3/uL (ref 1.4–7.7)
Neutrophils Relative %: 65.9 % (ref 43.0–77.0)
Platelets: 122 10*3/uL — ABNORMAL LOW (ref 150.0–400.0)
RBC: 4.5 Mil/uL (ref 3.87–5.11)
RDW: 14.2 % (ref 11.5–15.5)
WBC: 6.3 10*3/uL (ref 4.0–10.5)

## 2021-04-13 LAB — TSH: TSH: 7.35 u[IU]/mL — ABNORMAL HIGH (ref 0.35–5.50)

## 2021-04-13 LAB — LIPID PANEL
Cholesterol: 122 mg/dL (ref 0–200)
HDL: 51.1 mg/dL (ref >39.00)
LDL Cholesterol: 54 mg/dL (ref 0–99)
NonHDL: 71.22
Total CHOL/HDL Ratio: 2
Triglycerides: 85 mg/dL (ref 0.0–149.0)
VLDL: 17 mg/dL (ref 0.0–40.0)

## 2021-04-14 NOTE — Progress Notes (Signed)
Please call patient: I have reviewed his/her lab results. Labs are all stable. Thyroid test remains in the low normal range. She does not need to restart her thyroid medication at this time. I will monitor these levels for her every 6 months; she may need to restart in the future. She should return for recheck if she notes fatigue. Thanks.

## 2021-04-26 ENCOUNTER — Other Ambulatory Visit: Payer: Self-pay | Admitting: Family Medicine

## 2021-04-26 DIAGNOSIS — M8580 Other specified disorders of bone density and structure, unspecified site: Secondary | ICD-10-CM

## 2021-04-26 DIAGNOSIS — Z780 Asymptomatic menopausal state: Secondary | ICD-10-CM

## 2021-05-12 DIAGNOSIS — L608 Other nail disorders: Secondary | ICD-10-CM | POA: Diagnosis not present

## 2021-05-24 DIAGNOSIS — I2510 Atherosclerotic heart disease of native coronary artery without angina pectoris: Secondary | ICD-10-CM | POA: Diagnosis not present

## 2021-05-24 DIAGNOSIS — I252 Old myocardial infarction: Secondary | ICD-10-CM | POA: Diagnosis not present

## 2021-05-24 DIAGNOSIS — Z9861 Coronary angioplasty status: Secondary | ICD-10-CM | POA: Diagnosis not present

## 2021-05-24 DIAGNOSIS — I480 Paroxysmal atrial fibrillation: Secondary | ICD-10-CM | POA: Diagnosis not present

## 2021-05-24 DIAGNOSIS — I10 Essential (primary) hypertension: Secondary | ICD-10-CM | POA: Diagnosis not present

## 2021-06-21 ENCOUNTER — Other Ambulatory Visit: Payer: Self-pay | Admitting: Family Medicine

## 2021-06-24 DIAGNOSIS — Z79899 Other long term (current) drug therapy: Secondary | ICD-10-CM | POA: Diagnosis not present

## 2021-06-24 DIAGNOSIS — M1710 Unilateral primary osteoarthritis, unspecified knee: Secondary | ICD-10-CM | POA: Diagnosis not present

## 2021-06-24 DIAGNOSIS — Z01818 Encounter for other preprocedural examination: Secondary | ICD-10-CM | POA: Diagnosis not present

## 2021-06-24 DIAGNOSIS — I4891 Unspecified atrial fibrillation: Secondary | ICD-10-CM | POA: Diagnosis not present

## 2021-06-24 DIAGNOSIS — R7309 Other abnormal glucose: Secondary | ICD-10-CM | POA: Diagnosis not present

## 2021-06-29 ENCOUNTER — Telehealth: Payer: Self-pay | Admitting: Family Medicine

## 2021-06-29 NOTE — Telephone Encounter (Signed)
PT states a surgery clearance has been sent over from [facility]/Dr Davina Poke. ? ?Have you received this? ? ?If so, does the pt need an OV to get approval? ? ?FYI: Surgery is scheduled for 04/24. ?

## 2021-06-30 NOTE — Telephone Encounter (Signed)
Clearance forms were faxed out today 06/30/2021 and I received confirmation. ?

## 2021-06-30 NOTE — Telephone Encounter (Signed)
Jeani Hawking with [facility] has called to check on surgical clearance form that was faxed over on 06/21/21. Please call [phone] ?

## 2021-07-05 DIAGNOSIS — Z96651 Presence of right artificial knee joint: Secondary | ICD-10-CM | POA: Diagnosis not present

## 2021-07-05 DIAGNOSIS — I2510 Atherosclerotic heart disease of native coronary artery without angina pectoris: Secondary | ICD-10-CM | POA: Diagnosis not present

## 2021-07-05 DIAGNOSIS — M1711 Unilateral primary osteoarthritis, right knee: Secondary | ICD-10-CM | POA: Diagnosis not present

## 2021-07-05 DIAGNOSIS — I4891 Unspecified atrial fibrillation: Secondary | ICD-10-CM | POA: Diagnosis not present

## 2021-07-05 DIAGNOSIS — Z955 Presence of coronary angioplasty implant and graft: Secondary | ICD-10-CM | POA: Diagnosis not present

## 2021-07-05 DIAGNOSIS — I252 Old myocardial infarction: Secondary | ICD-10-CM | POA: Diagnosis not present

## 2021-07-06 DIAGNOSIS — I252 Old myocardial infarction: Secondary | ICD-10-CM | POA: Diagnosis not present

## 2021-07-06 DIAGNOSIS — Z955 Presence of coronary angioplasty implant and graft: Secondary | ICD-10-CM | POA: Diagnosis not present

## 2021-07-06 DIAGNOSIS — I4891 Unspecified atrial fibrillation: Secondary | ICD-10-CM | POA: Diagnosis not present

## 2021-07-06 DIAGNOSIS — I2510 Atherosclerotic heart disease of native coronary artery without angina pectoris: Secondary | ICD-10-CM | POA: Diagnosis not present

## 2021-07-07 DIAGNOSIS — M25561 Pain in right knee: Secondary | ICD-10-CM | POA: Diagnosis not present

## 2021-07-07 DIAGNOSIS — M1711 Unilateral primary osteoarthritis, right knee: Secondary | ICD-10-CM | POA: Diagnosis not present

## 2021-07-07 DIAGNOSIS — R531 Weakness: Secondary | ICD-10-CM | POA: Diagnosis not present

## 2021-07-07 DIAGNOSIS — R2689 Other abnormalities of gait and mobility: Secondary | ICD-10-CM | POA: Diagnosis not present

## 2021-07-07 DIAGNOSIS — Z96651 Presence of right artificial knee joint: Secondary | ICD-10-CM | POA: Diagnosis not present

## 2021-07-07 DIAGNOSIS — R5383 Other fatigue: Secondary | ICD-10-CM | POA: Diagnosis not present

## 2021-07-07 DIAGNOSIS — Z471 Aftercare following joint replacement surgery: Secondary | ICD-10-CM | POA: Diagnosis not present

## 2021-07-09 DIAGNOSIS — R2689 Other abnormalities of gait and mobility: Secondary | ICD-10-CM | POA: Diagnosis not present

## 2021-07-09 DIAGNOSIS — R5383 Other fatigue: Secondary | ICD-10-CM | POA: Diagnosis not present

## 2021-07-09 DIAGNOSIS — Z96651 Presence of right artificial knee joint: Secondary | ICD-10-CM | POA: Diagnosis not present

## 2021-07-09 DIAGNOSIS — Z471 Aftercare following joint replacement surgery: Secondary | ICD-10-CM | POA: Diagnosis not present

## 2021-07-09 DIAGNOSIS — M1711 Unilateral primary osteoarthritis, right knee: Secondary | ICD-10-CM | POA: Diagnosis not present

## 2021-07-09 DIAGNOSIS — R531 Weakness: Secondary | ICD-10-CM | POA: Diagnosis not present

## 2021-07-09 DIAGNOSIS — M25561 Pain in right knee: Secondary | ICD-10-CM | POA: Diagnosis not present

## 2021-07-12 DIAGNOSIS — M25561 Pain in right knee: Secondary | ICD-10-CM | POA: Diagnosis not present

## 2021-07-12 DIAGNOSIS — Z471 Aftercare following joint replacement surgery: Secondary | ICD-10-CM | POA: Diagnosis not present

## 2021-07-12 DIAGNOSIS — R2689 Other abnormalities of gait and mobility: Secondary | ICD-10-CM | POA: Diagnosis not present

## 2021-07-12 DIAGNOSIS — R5383 Other fatigue: Secondary | ICD-10-CM | POA: Diagnosis not present

## 2021-07-12 DIAGNOSIS — M1711 Unilateral primary osteoarthritis, right knee: Secondary | ICD-10-CM | POA: Diagnosis not present

## 2021-07-12 DIAGNOSIS — Z96651 Presence of right artificial knee joint: Secondary | ICD-10-CM | POA: Diagnosis not present

## 2021-07-12 DIAGNOSIS — R531 Weakness: Secondary | ICD-10-CM | POA: Diagnosis not present

## 2021-07-14 DIAGNOSIS — R531 Weakness: Secondary | ICD-10-CM | POA: Diagnosis not present

## 2021-07-14 DIAGNOSIS — M1711 Unilateral primary osteoarthritis, right knee: Secondary | ICD-10-CM | POA: Diagnosis not present

## 2021-07-14 DIAGNOSIS — Z471 Aftercare following joint replacement surgery: Secondary | ICD-10-CM | POA: Diagnosis not present

## 2021-07-14 DIAGNOSIS — M25561 Pain in right knee: Secondary | ICD-10-CM | POA: Diagnosis not present

## 2021-07-14 DIAGNOSIS — R2689 Other abnormalities of gait and mobility: Secondary | ICD-10-CM | POA: Diagnosis not present

## 2021-07-14 DIAGNOSIS — Z96651 Presence of right artificial knee joint: Secondary | ICD-10-CM | POA: Diagnosis not present

## 2021-07-14 DIAGNOSIS — R5383 Other fatigue: Secondary | ICD-10-CM | POA: Diagnosis not present

## 2021-07-19 DIAGNOSIS — Z471 Aftercare following joint replacement surgery: Secondary | ICD-10-CM | POA: Diagnosis not present

## 2021-07-19 DIAGNOSIS — R531 Weakness: Secondary | ICD-10-CM | POA: Diagnosis not present

## 2021-07-19 DIAGNOSIS — R5383 Other fatigue: Secondary | ICD-10-CM | POA: Diagnosis not present

## 2021-07-19 DIAGNOSIS — Z96651 Presence of right artificial knee joint: Secondary | ICD-10-CM | POA: Diagnosis not present

## 2021-07-19 DIAGNOSIS — R2689 Other abnormalities of gait and mobility: Secondary | ICD-10-CM | POA: Diagnosis not present

## 2021-07-19 DIAGNOSIS — M1711 Unilateral primary osteoarthritis, right knee: Secondary | ICD-10-CM | POA: Diagnosis not present

## 2021-07-19 DIAGNOSIS — M25561 Pain in right knee: Secondary | ICD-10-CM | POA: Diagnosis not present

## 2021-07-21 DIAGNOSIS — Z471 Aftercare following joint replacement surgery: Secondary | ICD-10-CM | POA: Diagnosis not present

## 2021-07-21 DIAGNOSIS — R531 Weakness: Secondary | ICD-10-CM | POA: Diagnosis not present

## 2021-07-21 DIAGNOSIS — M25561 Pain in right knee: Secondary | ICD-10-CM | POA: Diagnosis not present

## 2021-07-21 DIAGNOSIS — Z96651 Presence of right artificial knee joint: Secondary | ICD-10-CM | POA: Diagnosis not present

## 2021-07-21 DIAGNOSIS — R2689 Other abnormalities of gait and mobility: Secondary | ICD-10-CM | POA: Diagnosis not present

## 2021-07-21 DIAGNOSIS — M1711 Unilateral primary osteoarthritis, right knee: Secondary | ICD-10-CM | POA: Diagnosis not present

## 2021-07-21 DIAGNOSIS — R5383 Other fatigue: Secondary | ICD-10-CM | POA: Diagnosis not present

## 2021-07-22 DIAGNOSIS — Z7409 Other reduced mobility: Secondary | ICD-10-CM | POA: Diagnosis not present

## 2021-07-22 DIAGNOSIS — M25561 Pain in right knee: Secondary | ICD-10-CM | POA: Diagnosis not present

## 2021-07-22 DIAGNOSIS — M25661 Stiffness of right knee, not elsewhere classified: Secondary | ICD-10-CM | POA: Diagnosis not present

## 2021-07-22 DIAGNOSIS — Z96651 Presence of right artificial knee joint: Secondary | ICD-10-CM | POA: Diagnosis not present

## 2021-07-22 DIAGNOSIS — M1711 Unilateral primary osteoarthritis, right knee: Secondary | ICD-10-CM | POA: Diagnosis not present

## 2021-07-22 DIAGNOSIS — Z471 Aftercare following joint replacement surgery: Secondary | ICD-10-CM | POA: Diagnosis not present

## 2021-08-02 DIAGNOSIS — M1711 Unilateral primary osteoarthritis, right knee: Secondary | ICD-10-CM | POA: Diagnosis not present

## 2021-08-02 DIAGNOSIS — M25661 Stiffness of right knee, not elsewhere classified: Secondary | ICD-10-CM | POA: Diagnosis not present

## 2021-08-02 DIAGNOSIS — Z96651 Presence of right artificial knee joint: Secondary | ICD-10-CM | POA: Diagnosis not present

## 2021-08-02 DIAGNOSIS — M25561 Pain in right knee: Secondary | ICD-10-CM | POA: Diagnosis not present

## 2021-08-02 DIAGNOSIS — Z7409 Other reduced mobility: Secondary | ICD-10-CM | POA: Diagnosis not present

## 2021-08-04 DIAGNOSIS — M1710 Unilateral primary osteoarthritis, unspecified knee: Secondary | ICD-10-CM | POA: Diagnosis not present

## 2021-08-04 DIAGNOSIS — E038 Other specified hypothyroidism: Secondary | ICD-10-CM | POA: Diagnosis not present

## 2021-08-04 DIAGNOSIS — Z7409 Other reduced mobility: Secondary | ICD-10-CM | POA: Diagnosis not present

## 2021-08-04 DIAGNOSIS — Z8679 Personal history of other diseases of the circulatory system: Secondary | ICD-10-CM | POA: Diagnosis not present

## 2021-08-04 DIAGNOSIS — Z95818 Presence of other cardiac implants and grafts: Secondary | ICD-10-CM | POA: Diagnosis not present

## 2021-08-04 DIAGNOSIS — I480 Paroxysmal atrial fibrillation: Secondary | ICD-10-CM | POA: Diagnosis not present

## 2021-08-04 DIAGNOSIS — M25561 Pain in right knee: Secondary | ICD-10-CM | POA: Diagnosis not present

## 2021-08-04 DIAGNOSIS — M25661 Stiffness of right knee, not elsewhere classified: Secondary | ICD-10-CM | POA: Diagnosis not present

## 2021-08-04 DIAGNOSIS — I4891 Unspecified atrial fibrillation: Secondary | ICD-10-CM | POA: Diagnosis not present

## 2021-08-04 DIAGNOSIS — Z96651 Presence of right artificial knee joint: Secondary | ICD-10-CM | POA: Diagnosis not present

## 2021-08-04 DIAGNOSIS — I2510 Atherosclerotic heart disease of native coronary artery without angina pectoris: Secondary | ICD-10-CM | POA: Diagnosis not present

## 2021-08-17 ENCOUNTER — Other Ambulatory Visit: Payer: Self-pay | Admitting: Family Medicine

## 2021-08-17 DIAGNOSIS — Z1231 Encounter for screening mammogram for malignant neoplasm of breast: Secondary | ICD-10-CM

## 2021-08-23 DIAGNOSIS — Z471 Aftercare following joint replacement surgery: Secondary | ICD-10-CM | POA: Diagnosis not present

## 2021-08-23 DIAGNOSIS — Z96651 Presence of right artificial knee joint: Secondary | ICD-10-CM | POA: Diagnosis not present

## 2021-08-23 DIAGNOSIS — Z7409 Other reduced mobility: Secondary | ICD-10-CM | POA: Diagnosis not present

## 2021-08-23 DIAGNOSIS — M25561 Pain in right knee: Secondary | ICD-10-CM | POA: Diagnosis not present

## 2021-08-23 DIAGNOSIS — M1711 Unilateral primary osteoarthritis, right knee: Secondary | ICD-10-CM | POA: Diagnosis not present

## 2021-08-23 DIAGNOSIS — M25661 Stiffness of right knee, not elsewhere classified: Secondary | ICD-10-CM | POA: Diagnosis not present

## 2021-08-27 DIAGNOSIS — M25561 Pain in right knee: Secondary | ICD-10-CM | POA: Diagnosis not present

## 2021-08-27 DIAGNOSIS — Z7409 Other reduced mobility: Secondary | ICD-10-CM | POA: Diagnosis not present

## 2021-08-27 DIAGNOSIS — Z96651 Presence of right artificial knee joint: Secondary | ICD-10-CM | POA: Diagnosis not present

## 2021-08-27 DIAGNOSIS — M1711 Unilateral primary osteoarthritis, right knee: Secondary | ICD-10-CM | POA: Diagnosis not present

## 2021-08-27 DIAGNOSIS — M25661 Stiffness of right knee, not elsewhere classified: Secondary | ICD-10-CM | POA: Diagnosis not present

## 2021-08-27 DIAGNOSIS — Z471 Aftercare following joint replacement surgery: Secondary | ICD-10-CM | POA: Diagnosis not present

## 2021-09-13 DIAGNOSIS — I480 Paroxysmal atrial fibrillation: Secondary | ICD-10-CM | POA: Diagnosis not present

## 2021-09-13 DIAGNOSIS — I4892 Unspecified atrial flutter: Secondary | ICD-10-CM | POA: Diagnosis not present

## 2021-09-13 DIAGNOSIS — D649 Anemia, unspecified: Secondary | ICD-10-CM | POA: Diagnosis not present

## 2021-09-13 DIAGNOSIS — I4439 Other atrioventricular block: Secondary | ICD-10-CM | POA: Diagnosis not present

## 2021-09-15 DIAGNOSIS — I4439 Other atrioventricular block: Secondary | ICD-10-CM | POA: Diagnosis not present

## 2021-09-15 DIAGNOSIS — I4892 Unspecified atrial flutter: Secondary | ICD-10-CM | POA: Diagnosis not present

## 2021-09-17 ENCOUNTER — Ambulatory Visit
Admission: RE | Admit: 2021-09-17 | Discharge: 2021-09-17 | Disposition: A | Discharge status: Home / Self Care | Payer: Medicare Other | Source: Ambulatory Visit | Attending: Family Medicine | Admitting: Family Medicine

## 2021-09-17 DIAGNOSIS — Z1231 Encounter for screening mammogram for malignant neoplasm of breast: Secondary | ICD-10-CM | POA: Diagnosis not present

## 2021-09-17 DIAGNOSIS — Z780 Asymptomatic menopausal state: Secondary | ICD-10-CM | POA: Diagnosis not present

## 2021-09-17 DIAGNOSIS — M85851 Other specified disorders of bone density and structure, right thigh: Secondary | ICD-10-CM | POA: Diagnosis not present

## 2021-09-17 DIAGNOSIS — M8580 Other specified disorders of bone density and structure, unspecified site: Secondary | ICD-10-CM

## 2021-09-20 ENCOUNTER — Other Ambulatory Visit: Payer: Self-pay | Admitting: Family Medicine

## 2021-09-20 DIAGNOSIS — R928 Other abnormal and inconclusive findings on diagnostic imaging of breast: Secondary | ICD-10-CM

## 2021-09-20 NOTE — Progress Notes (Signed)
Please call patient: I have reviewed his/her bone density test results. Her bones are slightly worsening but remain in the osteopenic range. No need for RX meds yet. Will recheck in 2 years.

## 2021-09-24 ENCOUNTER — Other Ambulatory Visit: Payer: Medicare Other

## 2021-10-13 ENCOUNTER — Ambulatory Visit: Payer: Medicare Other | Admitting: Family Medicine

## 2021-10-15 ENCOUNTER — Ambulatory Visit
Admission: RE | Admit: 2021-10-15 | Discharge: 2021-10-15 | Disposition: A | Discharge status: Home / Self Care | Payer: Medicare Other | Source: Ambulatory Visit | Attending: Family Medicine | Admitting: Family Medicine

## 2021-10-15 DIAGNOSIS — R928 Other abnormal and inconclusive findings on diagnostic imaging of breast: Secondary | ICD-10-CM

## 2021-10-15 DIAGNOSIS — N6489 Other specified disorders of breast: Secondary | ICD-10-CM | POA: Diagnosis not present

## 2021-10-15 DIAGNOSIS — R922 Inconclusive mammogram: Secondary | ICD-10-CM | POA: Diagnosis not present

## 2021-10-18 ENCOUNTER — Ambulatory Visit (INDEPENDENT_AMBULATORY_CARE_PROVIDER_SITE_OTHER): Payer: Medicare Other

## 2021-10-18 ENCOUNTER — Encounter: Payer: Self-pay | Admitting: Family Medicine

## 2021-10-18 ENCOUNTER — Ambulatory Visit (INDEPENDENT_AMBULATORY_CARE_PROVIDER_SITE_OTHER): Payer: Medicare Other | Admitting: Family Medicine

## 2021-10-18 VITALS — BP 104/64 | HR 86 | Temp 97.8°F | Ht 62.0 in | Wt 143.4 lb

## 2021-10-18 VITALS — BP 118/64 | HR 92 | Temp 97.6°F | Wt 142.4 lb

## 2021-10-18 DIAGNOSIS — I10 Essential (primary) hypertension: Secondary | ICD-10-CM | POA: Diagnosis not present

## 2021-10-18 DIAGNOSIS — H0100A Unspecified blepharitis right eye, upper and lower eyelids: Secondary | ICD-10-CM | POA: Diagnosis not present

## 2021-10-18 DIAGNOSIS — E782 Mixed hyperlipidemia: Secondary | ICD-10-CM | POA: Diagnosis not present

## 2021-10-18 DIAGNOSIS — H0015 Chalazion left lower eyelid: Secondary | ICD-10-CM | POA: Diagnosis not present

## 2021-10-18 DIAGNOSIS — E038 Other specified hypothyroidism: Secondary | ICD-10-CM | POA: Diagnosis not present

## 2021-10-18 DIAGNOSIS — Z9189 Other specified personal risk factors, not elsewhere classified: Secondary | ICD-10-CM

## 2021-10-18 DIAGNOSIS — H16223 Keratoconjunctivitis sicca, not specified as Sjogren's, bilateral: Secondary | ICD-10-CM | POA: Diagnosis not present

## 2021-10-18 DIAGNOSIS — Z0000 Encounter for general adult medical examination without abnormal findings: Secondary | ICD-10-CM | POA: Diagnosis not present

## 2021-10-18 DIAGNOSIS — M85852 Other specified disorders of bone density and structure, left thigh: Secondary | ICD-10-CM | POA: Diagnosis not present

## 2021-10-18 DIAGNOSIS — H0100B Unspecified blepharitis left eye, upper and lower eyelids: Secondary | ICD-10-CM | POA: Diagnosis not present

## 2021-10-18 DIAGNOSIS — I480 Paroxysmal atrial fibrillation: Secondary | ICD-10-CM | POA: Diagnosis not present

## 2021-10-18 MED ORDER — SERTRALINE HCL 100 MG PO TABS: 150.0000 mg | ORAL_TABLET | Freq: Every day | ORAL | 3 refills | Status: DC

## 2021-10-18 NOTE — Patient Instructions (Signed)
Please return in 6 months for your annual complete physical; please come fasting.   If you have any questions or concerns, please don't hesitate to send me a message via MyChart or call the office at [phone]. Thank you for visiting with [place] today! It's our pleasure caring for you.   Calcium Intake Recommendations You can take Caltrate Plus twice a day or get it through your diet or other OTC supplements (Viactiv, OsCal etc)  Calcium is a mineral that affects many functions in the body, including: Blood clotting. Blood vessel function. Nerve impulse conduction. Hormone secretion. Muscle contraction. Bone and teeth functions.  Most of your body's calcium supply is stored in your bones and teeth. When your calcium stores are low, you may be at risk for low bone mass, bone loss, and bone fractures. Consuming enough calcium helps to grow healthy bones and teeth and to prevent breakdown over time. It is very important that you get enough calcium if you are: A child undergoing rapid growth. An adolescent girl. A pre- or post-menopausal woman. A woman whose menstrual cycle has stopped due to anorexia nervosa or regular intense exercise. An individual with lactose intolerance or a milk allergy. A vegetarian.  What is my plan? Try to consume the recommended amount of calcium daily based on your age. Depending on your overall health, your health care provider may recommend increased calcium intake. General daily calcium intake recommendations by age are: Birth to 6 months: 200 mg. Infants 7 to 12 months: 260 mg. Children 1 to 3 years: 700 mg. Children 4 to 8 years: 1,000 mg. Children 9 to 13 years: 1,300 mg. Teens 14 to 18 years: 1,300 mg. Adults 19 to 50 years: 1,000 mg. Adult women 51 to 70 years: 1,200 mg. Adult men 51 to 70 years: 1,000 mg. Adults 71 years and older: 1,200 mg. Pregnant and breastfeeding teens: 1,300 mg. Pregnant and breastfeeding adults: 1,000 mg.  What do I need  to know about calcium intake? In order for the body to absorb calcium, it needs vitamin D. You can get vitamin D through (we recommend getting [phone] units of Vitamin D daily) Direct exposure of the skin to sunlight. Foods, such as egg yolks, liver, saltwater fish, and fortified milk. Supplements. Consuming too much calcium may cause: Constipation. Decreased absorption of iron and zinc. Kidney stones. Calcium supplements may interact with certain medicines. Check with your health care provider before starting any calcium supplements. Try to get most of your calcium from food. What foods can I eat? Grains  Fortified oatmeal. Fortified ready-to-eat cereals. Fortified frozen waffles. Vegetables Turnip greens. Broccoli. Fruits Fortified orange juice. Meats and Other Protein Sources Canned sardines with bones. Canned salmon with bones. Soy beans. Tofu. Baked beans. Almonds. [place] nuts. Sunflower seeds. Dairy Milk. Yogurt. Cheese. Cottage cheese. Beverages Fortified soy milk. Fortified rice milk. Sweets/Desserts Pudding. Ice Cream. Milkshakes. Blackstrap molasses. The items listed above may not be a complete list of recommended foods or beverages. Contact your dietitian for more options. What foods can affect my calcium intake? It may be more difficult for your body to use calcium or calcium may leave your body more quickly if you consume large amounts of: Sodium. Protein. Caffeine. Alcohol.  This information is not intended to replace advice given to you by your health care provider. Make sure you discuss any questions you have with your health care provider. Document Released: 10/13/2003 Document Revised: 09/18/2015 Document Reviewed: 08/06/2013 Elsevier Interactive Patient Education  2018 [employer].

## 2021-10-18 NOTE — Progress Notes (Signed)
Subjective  CC:  Chief Complaint  Patient presents with   Hypertension    Pt here to F/U with Bp    HPI: Brittney Cochran is a 81 y.o. female who presents to the office today to address the problems listed above in the chief complaint. Hypertension f/u: Control is good . Pt reports she is doing well. taking medications as instructed, no medication side effects noted, no TIAs, no chest pain on exertion, no dyspnea on exertion, no swelling of ankles. She denies adverse effects from his BP medications. Compliance with medication is good.  Recurrent afib: noted on ekg preop for knee replacement in march. Reviewed cards notes since. Reviewed recent labs. On eloquis now but does not like it. Cards deciding on cardioversion vs ablation. No palpitations or cp or sob. Heart rates are controlled. H/o cva bleed, traumatic. Pound for blood thinners by NS. Nl magnesium and lytes Subclinical hypothyroidism with recent tsh 7ish. Remains asymptomatic.  S/p right knee replacement. Has done very well.  HDL at goal  Reviewed dexa 7.2023. persistent but worsening osteopenia with T=-2.2 at left hip but elevated FRAX score: 10 yr hip fracture 5.8%  Assessment  1. Essential hypertension   2. Mixed hyperlipidemia   3. Subclinical hypothyroidism   4. Paroxysmal atrial fibrillation (HCC)   5. Osteopenia of left hip   6. Fracture Risk Assessment Score (FRAX) indicating greater than 3% risk for hip fracture      Plan   Hypertension f/u: BP control is well controlled. Low dose lisinopril HLD on statin Afib: continue eloquis. F/u with cards Discussed worsening oseopenia and meaning of elevated FRAX score; will defer treatment discussion until January; will get heart rate/afib treatment completed. Will consider biphophonates   Education regarding management of these chronic disease states was given. Management strategies discussed on successive visits include dietary and exercise recommendations, goals of  achieving and maintaining IBW, and lifestyle modifications aiming for adequate sleep and minimizing stressors.   Follow up: Return in about 6 months (around 04/20/2022) for complete physical.  No orders of the defined types were placed in this encounter.  Meds ordered this encounter  Medications   sertraline (ZOLOFT) 100 MG tablet    Sig: Take 1.5 tablets (150 mg total) by mouth daily.    Dispense:  135 tablet    Refill:  3    Please keep on file for next due refill      BP Readings from Last 3 Encounters:  10/18/21 104/64  10/18/21 118/64  04/12/21 123/85   Wt Readings from Last 3 Encounters:  10/18/21 143 lb 6.4 oz (65 kg)  10/18/21 142 lb 6.6 oz (64.6 kg)  04/12/21 146 lb 3.2 oz (66.3 kg)    Lab Results  Component Value Date   CHOL 122 04/12/2021   CHOL 134 10/28/2019   CHOL 140 05/18/2017   Lab Results  Component Value Date   HDL 51.10 04/12/2021   HDL 60 10/28/2019   HDL 80.00 05/18/2017   Lab Results  Component Value Date   LDLCALC 54 04/12/2021   LDLCALC 56 10/28/2019   LDLCALC 39 05/18/2017   Lab Results  Component Value Date   TRIG 85.0 04/12/2021   TRIG 94 10/28/2019   TRIG 104.0 05/18/2017   Lab Results  Component Value Date   CHOLHDL 2 04/12/2021   CHOLHDL 2.2 10/28/2019   CHOLHDL 2 05/18/2017   No results found for: "LDLDIRECT" Lab Results  Component Value Date   CREATININE 0.97  04/12/2021   BUN 26 (H) 04/12/2021   NA 140 04/12/2021   K 4.9 04/12/2021   CL 102 04/12/2021   CO2 32 04/12/2021   Lab Results  Component Value Date   TSH 7.35 (H) 04/12/2021    The ASCVD Risk score (Arnett DK, et al., 2019) failed to calculate for the following reasons:   The 2019 ASCVD risk score is only valid for ages 68 to 69   The patient has a prior MI or stroke diagnosis  I reviewed the patients updated PMH, FH, and SocHx.    Patient Active Problem List   Diagnosis Date Noted   Hemorrhagic cerebrovascular accident (CVA) ([place]) 04/20/2018     Priority: High   Major depression, recurrent, chronic ([place]) 02/12/2015    Priority: High   Coronary artery disease involving native coronary artery of native heart without angina pectoris 05/16/2012    Priority: High   Generalized anxiety disorder 01/10/2012    Priority: High   History of non-ST elevation myocardial infarction (NSTEMI) 12/05/2011    Priority: High   Mixed hyperlipidemia 12/31/2010    Priority: High   Paroxysmal atrial fibrillation ([place]) 12/14/2010    Priority: High   Essential hypertension 01/09/2009    Priority: High   Subclinical hypothyroidism 03/24/2017    Priority: Medium    DJD (degenerative joint disease), cervical 05/12/2016    Priority: Medium    Foraminal stenosis of cervical region 08/18/2015    Priority: Medium    Chronic pain of both knees 02/12/2015    Priority: Medium    Zenker diverticulum 05/08/2014    Priority: Medium    Osteopenia 06/08/2012    Priority: Medium    Gastro-esophageal reflux disease without esophagitis 08/12/2011    Priority: Medium    Osteoarthritis, hand 12/31/2010    Priority: Low   Rosacea 12/31/2010    Priority: Low   Fracture Risk Assessment Score (FRAX) indicating greater than 3% risk for hip fracture 10/18/2021    Allergies: Clindamycin  Social History: Patient  reports that she has never smoked. She has never used smokeless tobacco. She reports current alcohol use of about 2.0 - 3.0 standard drinks of alcohol per week. She reports that she does not use drugs.  Current Meds  Medication Sig   ALPRAZolam (XANAX) 0.5 MG tablet Take 1 tablet (0.5 mg total) by mouth daily as needed.   aspirin EC 81 MG tablet Take 81 mg by mouth daily. Swallow whole.   carvedilol (COREG) 12.5 MG tablet TK 1 T PO BID WITH MEALS   Co-Enzyme Q-10 30 MG CAPS Take by mouth.   ELIQUIS 5 MG TABS tablet Take 5 mg by mouth 2 (two) times daily.   fluticasone (FLONASE) 50 MCG/ACT nasal spray USE 1 SPRAY NASALLY DAILY   lisinopril  (PRINIVIL,ZESTRIL) 2.5 MG tablet Take 2.5 mg by mouth daily.    nitroGLYCERIN (NITROSTAT) 0.4 MG SL tablet Place 0.4 mg under the tongue.   omeprazole (PRILOSEC) 40 MG capsule TAKE 1 CAPSULE(40 MG) BY MOUTH DAILY   rosuvastatin (CRESTOR) 40 MG tablet Take 40 mg by mouth daily.    VITAMIN D, CHOLECALCIFEROL, PO Take by mouth.   [DISCONTINUED] sertraline (ZOLOFT) 100 MG tablet TAKE 1 AND 1/2 TABLETS BY MOUTH DAILY    Review of Systems: Cardiovascular: negative for chest pain, palpitations, leg swelling, orthopnea Respiratory: negative for SOB, wheezing or persistent cough Gastrointestinal: negative for abdominal pain Genitourinary: negative for dysuria or gross hematuria  Objective  Vitals: BP 104/64  Pulse 86   Temp 97.8 F (36.6 C)   Ht '5\' 2"'$  (1.575 m)   Wt 143 lb 6.4 oz (65 kg)   SpO2 96%   BMI 26.23 kg/m  General: no acute distress . Looks great Psych:  Alert and oriented, normal mood and affect HEENT:  Normocephalic, atraumatic, supple neck  Cardiovascular:  irreg irreg without murmur. no edema Respiratory:  Good breath sounds bilaterally, CTAB with normal respiratory effort Neurologic:   Mental status is normal Commons side effects, risks, benefits, and alternatives for medications and treatment plan prescribed today were discussed, and the patient expressed understanding of the given instructions. Patient is instructed to call or message via MyChart if he/she has any questions or concerns regarding our treatment plan. No barriers to understanding were identified. We discussed Red Flag symptoms and signs in detail. Patient expressed understanding regarding what to do in case of urgent or emergency type symptoms.  Medication list was reconciled, printed and provided to the patient in AVS. Patient instructions and summary information was reviewed with the patient as documented in the AVS. This note was prepared with assistance of Dragon voice recognition software. Occasional  wrong-word or sound-a-like substitutions may have occurred due to the inherent limitations of voice recognition software  This visit occurred during the SARS-CoV-2 public health emergency.  Safety protocols were in place, including screening questions prior to the visit, additional usage of staff PPE, and extensive cleaning of exam room while observing appropriate contact time as indicated for disinfecting solutions.

## 2021-10-18 NOTE — Patient Instructions (Addendum)
[name] , Thank you for taking time to come for your Medicare Wellness Visit. I appreciate your ongoing commitment to your health goals. Please review the following plan we discussed and let me know if I can assist you in the future.   Screening recommendations/referrals: Colonoscopy: no longer required  Mammogram: done 09/17/21 repeat every year  Bone Density: done 09/17/21 repeat every 2 years  Recommended yearly ophthalmology/optometry visit for glaucoma screening and checkup Recommended yearly dental visit for hygiene and checkup  Vaccinations: Influenza vaccine: done 01/05/21 repeat every year  Pneumococcal vaccine: Up to date Tdap vaccine: discontinued  Shingles vaccine: Shingrix discussed. Please contact your pharmacy for coverage information.    Covid-19:completed 1/29, 05/08/19   Advanced directives: Advance directive discussed with you today. I have provided a copy for you to complete at home and have notarized. Once this is complete please bring a copy in to our office so we can scan it into your chart. Conditions/risks identified: get back into an exercise program   Next appointment: Follow up in one year for your annual wellness visit    Preventive Care 65 Years and Older, Female Preventive care refers to lifestyle choices and visits with your health care provider that can promote health and wellness. What does preventive care include? A yearly physical exam. This is also called an annual well check. Dental exams once or twice a year. Routine eye exams. Ask your health care provider how often you should have your eyes checked. Personal lifestyle choices, including: Daily care of your teeth and gums. Regular physical activity. Eating a healthy diet. Avoiding tobacco and drug use. Limiting alcohol use. Practicing safe sex. Taking low-dose aspirin every day. Taking vitamin and mineral supplements as recommended by your health care provider. What happens during an annual  well check? The services and screenings done by your health care provider during your annual well check will depend on your age, overall health, lifestyle risk factors, and family history of disease. Counseling  Your health care provider may ask you questions about your: Alcohol use. Tobacco use. Drug use. Emotional well-being. Home and relationship well-being. Sexual activity. Eating habits. History of falls. Memory and ability to understand (cognition). Work and work Statistician. Reproductive health. Screening  You may have the following tests or measurements: Height, weight, and BMI. Blood pressure. Lipid and cholesterol levels. These may be checked every 5 years, or more frequently if you are over 42 years old. Skin check. Lung cancer screening. You may have this screening every year starting at age 55 if you have a 30-pack-year history of smoking and currently smoke or have quit within the past 15 years. Fecal occult blood test (FOBT) of the stool. You may have this test every year starting at age 55. Flexible sigmoidoscopy or colonoscopy. You may have a sigmoidoscopy every 5 years or a colonoscopy every 10 years starting at age 29. Hepatitis C blood test. Hepatitis B blood test. Sexually transmitted disease (STD) testing. Diabetes screening. This is done by checking your blood sugar (glucose) after you have not eaten for a while (fasting). You may have this done every 1-3 years. Bone density scan. This is done to screen for osteoporosis. You may have this done starting at age 56. Mammogram. This may be done every 1-2 years. Talk to your health care provider about how often you should have regular mammograms. Talk with your health care provider about your test results, treatment options, and if necessary, the need for more tests. Vaccines  Your health care provider may recommend certain vaccines, such as: Influenza vaccine. This is recommended every year. Tetanus, diphtheria,  and acellular pertussis (Tdap, Td) vaccine. You may need a Td booster every 10 years. Zoster vaccine. You may need this after age 39. Pneumococcal 13-valent conjugate (PCV13) vaccine. One dose is recommended after age 14. Pneumococcal polysaccharide (PPSV23) vaccine. One dose is recommended after age 51. Talk to your health care provider about which screenings and vaccines you need and how often you need them. This information is not intended to replace advice given to you by your health care provider. Make sure you discuss any questions you have with your health care provider. Document Released: 03/27/2015 Document Revised: 11/18/2015 Document Reviewed: 12/30/2014 Elsevier Interactive Patient Education  2017 [place] Prevention in the Home Falls can cause injuries. They can happen to people of all ages. There are many things you can do to make your home safe and to help prevent falls. What can I do on the outside of my home? Regularly fix the edges of walkways and driveways and fix any cracks. Remove anything that might make you trip as you walk through a door, such as a raised step or threshold. Trim any bushes or trees on the path to your home. Use bright outdoor lighting. Clear any walking paths of anything that might make someone trip, such as rocks or tools. Regularly check to see if handrails are loose or broken. Make sure that both sides of any steps have handrails. Any raised decks and porches should have guardrails on the edges. Have any leaves, snow, or ice cleared regularly. Use sand or salt on walking paths during winter. Clean up any spills in your garage right away. This includes oil or grease spills. What can I do in the bathroom? Use night lights. Install grab bars by the toilet and in the tub and shower. Do not use towel bars as grab bars. Use non-skid mats or decals in the tub or shower. If you need to sit down in the shower, use a plastic, non-slip  stool. Keep the floor dry. Clean up any water that spills on the floor as soon as it happens. Remove soap buildup in the tub or shower regularly. Attach bath mats securely with double-sided non-slip rug tape. Do not have throw rugs and other things on the floor that can make you trip. What can I do in the bedroom? Use night lights. Make sure that you have a light by your bed that is easy to reach. Do not use any sheets or blankets that are too big for your bed. They should not hang down onto the floor. Have a firm chair that has side arms. You can use this for support while you get dressed. Do not have throw rugs and other things on the floor that can make you trip. What can I do in the kitchen? Clean up any spills right away. Avoid walking on wet floors. Keep items that you use a lot in easy-to-reach places. If you need to reach something above you, use a strong step stool that has a grab bar. Keep electrical cords out of the way. Do not use floor polish or wax that makes floors slippery. If you must use wax, use non-skid floor wax. Do not have throw rugs and other things on the floor that can make you trip. What can I do with my stairs? Do not leave any items on the stairs. Make sure that there are  handrails on both sides of the stairs and use them. Fix handrails that are broken or loose. Make sure that handrails are as long as the stairways. Check any carpeting to make sure that it is firmly attached to the stairs. Fix any carpet that is loose or worn. Avoid having throw rugs at the top or bottom of the stairs. If you do have throw rugs, attach them to the floor with carpet tape. Make sure that you have a light switch at the top of the stairs and the bottom of the stairs. If you do not have them, ask someone to add them for you. What else can I do to help prevent falls? Wear shoes that: Do not have high heels. Have rubber bottoms. Are comfortable and fit you well. Are closed at the  toe. Do not wear sandals. If you use a stepladder: Make sure that it is fully opened. Do not climb a closed stepladder. Make sure that both sides of the stepladder are locked into place. Ask someone to hold it for you, if possible. Clearly mark and make sure that you can see: Any grab bars or handrails. First and last steps. Where the edge of each step is. Use tools that help you move around (mobility aids) if they are needed. These include: Canes. Walkers. Scooters. Crutches. Turn on the lights when you go into a dark area. Replace any light bulbs as soon as they burn out. Set up your furniture so you have a clear path. Avoid moving your furniture around. If any of your floors are uneven, fix them. If there are any pets around you, be aware of where they are. Review your medicines with your doctor. Some medicines can make you feel dizzy. This can increase your chance of falling. Ask your doctor what other things that you can do to help prevent falls. This information is not intended to replace advice given to you by your health care provider. Make sure you discuss any questions you have with your health care provider. Document Released: 12/25/2008 Document Revised: 08/06/2015 Document Reviewed: 04/04/2014 Elsevier Interactive Patient Education  2017 [employer].

## 2021-10-18 NOTE — Progress Notes (Signed)
Subjective:   Brittney Cochran is a 81 y.o. female who presents for an Initial Medicare Annual Wellness Visit.  Review of Systems     Cardiac Risk Factors include: advanced age (>30mn, >>59women);dyslipidemia;hypertension     Objective:    Today's Vitals   10/18/21 1359  BP: 118/64  Pulse: 92  Temp: 97.6 F (36.4 C)  SpO2: 96%  Weight: 142 lb 6.6 oz (64.6 kg)   Body mass index is 26.05 kg/m.     10/18/2021    2:07 PM 04/12/2021    2:06 PM 03/12/2018    3:41 PM  Advanced Directives  Does Patient Have a Medical Advance Directive? No No No  Would patient like information on creating a medical advance directive? Yes (MAU/Ambulatory/Procedural Areas - Information given) No - Patient declined Yes (MAU/Ambulatory/Procedural Areas - Information given)    Current Medications (verified) Outpatient Encounter Medications as of 10/18/2021  Medication Sig   ALPRAZolam (XANAX) 0.5 MG tablet Take 1 tablet (0.5 mg total) by mouth daily as needed.   aspirin EC 81 MG tablet Take 81 mg by mouth daily. Swallow whole.   carvedilol (COREG) 12.5 MG tablet TK 1 T PO BID WITH MEALS   Co-Enzyme Q-10 30 MG CAPS Take by mouth.   ELIQUIS 5 MG TABS tablet Take 5 mg by mouth 2 (two) times daily.   fluticasone (FLONASE) 50 MCG/ACT nasal spray USE 1 SPRAY NASALLY DAILY   lisinopril (PRINIVIL,ZESTRIL) 2.5 MG tablet Take 2.5 mg by mouth daily.    nitroGLYCERIN (NITROSTAT) 0.4 MG SL tablet Place 0.4 mg under the tongue.   omeprazole (PRILOSEC) 40 MG capsule TAKE 1 CAPSULE(40 MG) BY MOUTH DAILY   rosuvastatin (CRESTOR) 40 MG tablet Take 40 mg by mouth daily.    sertraline (ZOLOFT) 100 MG tablet TAKE 1 AND 1/2 TABLETS BY MOUTH DAILY   VITAMIN D, CHOLECALCIFEROL, PO Take by mouth.   [DISCONTINUED] diclofenac sodium (VOLTAREN) 1 % GEL Apply 2 g topically 4 (four) times daily. To hands as needed (Patient not taking: Reported on 04/12/2021)   No facility-administered encounter medications on file as of 10/18/2021.     Allergies (verified) Clindamycin   History: Past Medical History:  Diagnosis Date   Atrial fibrillation ([place]    Coronary artery disease    GERD (gastroesophageal reflux disease)    endoscopy and esophageal diatation 2013 Dr. MPercell Miller  Hyperlipemia    Leiomyoma of body of uterus    Long term current use of anticoagulant therapy 07/07/2015   Stopped 2020; no recurrence of PAF since ablation. Stopped due to hemorrhagic cva.    Osteopenia    Stroke ([facility]    Subclinical hypothyroidism 03/24/2017   Started synthroid 25 03/2017 for increasing TSH.   Past Surgical History:  Procedure Laterality Date   ABLATION OF DYSRHYTHMIC FOCUS     BLADDER REPAIR  2011   BREAST BIOPSY Left 08/19/2013   CAROTID STENT  12/04/2011   ESOPHAGEAL DILATION  2013   Floating Kidney  1963   TUBAL LIGATION  1985   Family History  Problem Relation Age of Onset   Atrial fibrillation Mother    Transient ischemic attack Mother    Stroke Mother    Alcohol abuse Father    Diabetes Father    Early death Father    Heart attack Father    Diabetes Brother    Kidney disease Maternal Grandmother    Early death Maternal Grandfather    Diabetes Paternal Grandmother  Heart attack Paternal Grandmother    Heart attack Maternal Uncle    Stroke Son    Stroke Paternal Aunt    Social History   Socioeconomic History   Marital status: Married    Spouse name: Not on file   Number of children: Not on file   Years of education: Not on file   Highest education level: Not on file  Occupational History   Not on file  Tobacco Use   Smoking status: Never   Smokeless tobacco: Never  Vaping Use   Vaping Use: Never used  Substance and Sexual Activity   Alcohol use: Yes    Alcohol/week: 2.0 - 3.0 standard drinks of alcohol    Types: 2 - 3 Glasses of wine per week    Comment: Red Wine   Drug use: No   Sexual activity: Never  Other Topics Concern   Not on file  Social History Narrative   Not on file    Social Determinants of Health   Financial Resource Strain: Low Risk  (10/18/2021)   Overall Financial Resource Strain (CARDIA)    Difficulty of Paying Living Expenses: Not hard at all  Food Insecurity: No Food Insecurity (10/18/2021)   Hunger Vital Sign    Worried About Running Out of Food in the Last Year: Never true    Ran Out of Food in the Last Year: Never true  Transportation Needs: No Transportation Needs (10/18/2021)   PRAPARE - Hydrologist (Medical): No    Lack of Transportation (Non-Medical): No  Physical Activity: Inactive (10/18/2021)   Exercise Vital Sign    Days of Exercise per Week: 0 days    Minutes of Exercise per Session: 0 min  Stress: No Stress Concern Present (10/18/2021)   [place]    Feeling of Stress : Not at all  Social Connections: Moderately Isolated (10/18/2021)   Social Connection and Isolation Panel [NHANES]    Frequency of Communication with Friends and Family: More than three times a week    Frequency of Social Gatherings with Friends and Family: More than three times a week    Attends Religious Services: Never    Marine scientist or Organizations: No    Attends Music therapist: Never    Marital Status: Married    Tobacco Counseling Counseling given: Not Answered   Clinical Intake:  Pre-visit preparation completed: Yes  Pain : No/denies pain     BMI - recorded: 26.05 Nutritional Status: BMI 25 -29 Overweight Diabetes: No  How often do you need to have someone help you when you read instructions, pamphlets, or other written materials from your doctor or pharmacy?: 1 - Never  Diabetic?no  Interpreter Needed?: No  Information entered by :: Charlott Rakes, LPN   Activities of Daily Living    10/18/2021    2:11 PM 04/12/2021    2:05 PM  In your present state of health, do you have any difficulty performing the following  activities:  Hearing? 1 1  Comment HOH   Vision? 0 1  Difficulty concentrating or making decisions? 0 1  Walking or climbing stairs? 1 0  Comment go slowly   Dressing or bathing? 0 0  Doing errands, shopping? 0 0  Preparing Food and eating ? N   Using the Toilet? N   In the past six months, have you accidently leaked urine? N   Do you have problems  with loss of bowel control? N   Managing your Medications? N   Managing your Finances? N   Housekeeping or managing your Housekeeping? N     Patient Care Team: Leamon Arnt, MD as PCP - General (Family Medicine) Kary Kos, MD as Consulting Physician (Neurosurgery) Vickii Chafe., DO as Referring Physician (Cardiology) Ebbie Ridge, MD as Referring Physician (Cardiology)  Indicate any recent Medical Services you may have received from other than [facility] providers in the past year (date may be approximate).     Assessment:   This is a routine wellness examination for [place].  Hearing/Vision screen Hearing Screening - Comments:: Pt stated HOH  Vision Screening - Comments:: Pt follows  up with Dr Rudene Christians for annual eye exams   Dietary issues and exercise activities discussed: Current Exercise Habits: The patient does not participate in regular exercise at present   Goals Addressed             This Visit's Progress    Patient Stated       Get back to some exercise programs        Depression Screen    10/18/2021    2:03 PM 04/12/2021    2:06 PM 01/18/2021    3:44 PM 05/27/2020    2:52 PM 10/28/2019    2:43 PM 03/26/2019    3:07 PM 12/01/2017    3:09 PM  PHQ 2/9 Scores  PHQ - 2 Score 0 0 0 0 0 0 0  PHQ- 9 Score  0 4 1 0 2 0    Fall Risk    10/18/2021    2:08 PM 04/12/2021    2:06 PM 05/27/2020    2:53 PM 03/26/2019    3:09 PM 04/25/2018   11:10 AM  [place] in the past year? 0 0 0 0 0  Number falls in past yr: 0 0 0    Injury with Fall? 0 0 0    Risk for fall due to : Impaired vision;Impaired  mobility;Impaired balance/gait No Fall Risks     Follow up Falls prevention discussed Falls evaluation completed  Falls evaluation completed     [place]:  Any stairs in or around the home? Yes  If so, are there any without handrails? No  Home free of loose throw rugs in walkways, pet beds, electrical cords, etc? Yes  Adequate lighting in your home to reduce risk of falls? Yes   ASSISTIVE DEVICES UTILIZED TO PREVENT FALLS:  Life alert? No  Use of a cane, walker or w/c? No  Grab bars in the bathroom? No  Shower chair or bench in shower? No  Elevated toilet seat or a handicapped toilet? No   TIMED UP AND GO:  Was the test performed? Yes .  Length of time to ambulate 10 feet: 15 sec.   Gait steady and fast without use of assistive device  Cognitive Function:        10/18/2021    2:13 PM  6CIT Screen  What Year? 0 points  What month? 0 points  What time? 0 points  Count back from 20 0 points  Months in reverse 0 points  Repeat phrase 0 points  Total Score 0 points    Immunizations Immunization History  Administered Date(s) Administered   Fluad Quad(high Dose 65+) 12/16/2016, 11/29/2018, 01/08/2020, 01/05/2021   Influenza Split 01/11/2007, 02/04/2008, 01/01/2009, 04/19/2010, 12/14/2010, 01/12/2011   Influenza, High Dose Seasonal PF  11/29/2011, 12/18/2012, 12/10/2013, 02/02/2015, 12/16/2016, 12/01/2017, 01/08/2020, 01/05/2021   Influenza, Seasonal, Injecte, Preservative Fre 03/14/2009, 12/08/2015   Influenza,inj,quad, With Preservative 03/14/2009   Influenza-Unspecified 01/11/2007, 02/04/2008, 01/01/2009, 03/14/2009, 04/19/2010, 12/14/2010, 01/12/2011, 12/08/2015   Moderna Sars-Covid-2 Vaccination 04/12/2019, 05/08/2019   Pneumococcal Conjugate-13 04/08/2014   Pneumococcal Polysaccharide-23 01/01/2009, 03/14/2009   Pneumococcal-Unspecified 03/14/2009   Tdap 03/14/2004, 03/14/2006    Td not a candidate   Flu Vaccine status: Up to  date  Pneumococcal vaccine status: Up to date  Covid-19 vaccine status: Completed vaccines  Qualifies for Shingles Vaccine? Yes   Zostavax completed No   Shingrix Completed?: No.    Education has been provided regarding the importance of this vaccine. Patient has been advised to call insurance company to determine out of pocket expense if they have not yet received this vaccine. Advised may also receive vaccine at local pharmacy or Health Dept. Verbalized acceptance and understanding.  Screening Tests Health Maintenance  Topic Date Due   Zoster Vaccines- Shingrix (1 of 2) Never done   COVID-19 Vaccine (3 - Moderna risk series) 06/05/2019   INFLUENZA VACCINE  10/12/2021   MAMMOGRAM  09/18/2022   DEXA SCAN  09/18/2023   Pneumonia Vaccine 71+ Years old  Completed   HPV VACCINES  Aged Out    Health Maintenance  Health Maintenance Due  Topic Date Due   Zoster Vaccines- Shingrix (1 of 2) Never done   COVID-19 Vaccine (3 - Moderna risk series) 06/05/2019   INFLUENZA VACCINE  10/12/2021    Colorectal cancer screening: No longer required.   Mammogram status: Completed 09/17/21. Repeat every year  Bone Density status: Completed 09/17/21. Results reflect: Bone density results: OSTEOPENIA. Repeat every 2 years.   Additional Screening:   Vision Screening: Recommended annual ophthalmology exams for early detection of glaucoma and other disorders of the eye. Is the patient up to date with their annual eye exam?  Yes  Who is the provider or what is the name of the office in which the patient attends annual eye exams? Dr Trinna Post  If pt is not established with a provider, would they like to be referred to a provider to establish care? No .   Dental Screening: Recommended annual dental exams for proper oral hygiene  Community Resource Referral / Chronic Care Management: CRR required this visit?  No   CCM required this visit?  No      Plan:     I have personally reviewed and noted  the following in the patient's chart:   Medical and social history Use of alcohol, tobacco or illicit drugs  Current medications and supplements including opioid prescriptions. Patient is not currently taking opioid prescriptions. Functional ability and status Nutritional status Physical activity Advanced directives List of other physicians Hospitalizations, surgeries, and ER visits in previous 12 months Vitals Screenings to include cognitive, depression, and falls Referrals and appointments  In addition, I have reviewed and discussed with patient certain preventive protocols, quality metrics, and best practice recommendations. A written personalized care plan for preventive services as well as general preventive health recommendations were provided to patient.     Willette Brace, LPN   03/22/3788   Nurse Notes: none

## 2021-11-19 DIAGNOSIS — S0083XA Contusion of other part of head, initial encounter: Secondary | ICD-10-CM | POA: Diagnosis not present

## 2021-11-19 DIAGNOSIS — K219 Gastro-esophageal reflux disease without esophagitis: Secondary | ICD-10-CM | POA: Diagnosis not present

## 2021-11-19 DIAGNOSIS — R296 Repeated falls: Secondary | ICD-10-CM | POA: Diagnosis not present

## 2021-11-19 DIAGNOSIS — I10 Essential (primary) hypertension: Secondary | ICD-10-CM | POA: Diagnosis not present

## 2021-11-19 DIAGNOSIS — S0093XA Contusion of unspecified part of head, initial encounter: Secondary | ICD-10-CM | POA: Diagnosis not present

## 2021-11-19 DIAGNOSIS — I4891 Unspecified atrial fibrillation: Secondary | ICD-10-CM | POA: Diagnosis not present

## 2021-11-29 DIAGNOSIS — H02831 Dermatochalasis of right upper eyelid: Secondary | ICD-10-CM | POA: Diagnosis not present

## 2021-11-29 DIAGNOSIS — H16223 Keratoconjunctivitis sicca, not specified as Sjogren's, bilateral: Secondary | ICD-10-CM | POA: Diagnosis not present

## 2021-11-29 DIAGNOSIS — H524 Presbyopia: Secondary | ICD-10-CM | POA: Diagnosis not present

## 2021-11-29 DIAGNOSIS — H353131 Nonexudative age-related macular degeneration, bilateral, early dry stage: Secondary | ICD-10-CM | POA: Diagnosis not present

## 2021-11-29 DIAGNOSIS — H43813 Vitreous degeneration, bilateral: Secondary | ICD-10-CM | POA: Diagnosis not present

## 2021-11-29 DIAGNOSIS — H0100A Unspecified blepharitis right eye, upper and lower eyelids: Secondary | ICD-10-CM | POA: Diagnosis not present

## 2021-11-29 DIAGNOSIS — H52203 Unspecified astigmatism, bilateral: Secondary | ICD-10-CM | POA: Diagnosis not present

## 2021-11-29 DIAGNOSIS — H25013 Cortical age-related cataract, bilateral: Secondary | ICD-10-CM | POA: Diagnosis not present

## 2021-11-29 DIAGNOSIS — H2513 Age-related nuclear cataract, bilateral: Secondary | ICD-10-CM | POA: Diagnosis not present

## 2021-11-29 DIAGNOSIS — H3554 Dystrophies primarily involving the retinal pigment epithelium: Secondary | ICD-10-CM | POA: Diagnosis not present

## 2021-11-29 DIAGNOSIS — H0100B Unspecified blepharitis left eye, upper and lower eyelids: Secondary | ICD-10-CM | POA: Diagnosis not present

## 2021-11-29 DIAGNOSIS — H02834 Dermatochalasis of left upper eyelid: Secondary | ICD-10-CM | POA: Diagnosis not present

## 2021-12-01 DIAGNOSIS — L03032 Cellulitis of left toe: Secondary | ICD-10-CM | POA: Diagnosis not present

## 2021-12-02 DIAGNOSIS — Z96651 Presence of right artificial knee joint: Secondary | ICD-10-CM | POA: Diagnosis not present

## 2021-12-02 DIAGNOSIS — M25461 Effusion, right knee: Secondary | ICD-10-CM | POA: Diagnosis not present

## 2021-12-06 DIAGNOSIS — I619 Nontraumatic intracerebral hemorrhage, unspecified: Secondary | ICD-10-CM | POA: Diagnosis not present

## 2021-12-06 DIAGNOSIS — I480 Paroxysmal atrial fibrillation: Secondary | ICD-10-CM | POA: Diagnosis not present

## 2021-12-06 DIAGNOSIS — I4892 Unspecified atrial flutter: Secondary | ICD-10-CM | POA: Diagnosis not present

## 2021-12-06 DIAGNOSIS — I4891 Unspecified atrial fibrillation: Secondary | ICD-10-CM | POA: Diagnosis not present

## 2021-12-06 DIAGNOSIS — I2510 Atherosclerotic heart disease of native coronary artery without angina pectoris: Secondary | ICD-10-CM | POA: Diagnosis not present

## 2021-12-06 DIAGNOSIS — Z7901 Long term (current) use of anticoagulants: Secondary | ICD-10-CM | POA: Diagnosis not present

## 2021-12-06 DIAGNOSIS — I10 Essential (primary) hypertension: Secondary | ICD-10-CM | POA: Diagnosis not present

## 2021-12-22 DIAGNOSIS — I083 Combined rheumatic disorders of mitral, aortic and tricuspid valves: Secondary | ICD-10-CM | POA: Diagnosis not present

## 2022-01-20 DIAGNOSIS — L821 Other seborrheic keratosis: Secondary | ICD-10-CM | POA: Diagnosis not present

## 2022-01-20 DIAGNOSIS — L570 Actinic keratosis: Secondary | ICD-10-CM | POA: Diagnosis not present

## 2022-01-20 DIAGNOSIS — L718 Other rosacea: Secondary | ICD-10-CM | POA: Diagnosis not present

## 2022-03-16 ENCOUNTER — Telehealth: Payer: Medicare Other | Admitting: Family

## 2022-03-16 DIAGNOSIS — J019 Acute sinusitis, unspecified: Secondary | ICD-10-CM | POA: Diagnosis not present

## 2022-03-16 DIAGNOSIS — Z8616 Personal history of COVID-19: Secondary | ICD-10-CM

## 2022-03-16 MED ORDER — AMOXICILLIN-POT CLAVULANATE 875-125 MG PO TABS: 1.0000 | ORAL_TABLET | Freq: Two times a day (BID) | ORAL | 0 refills | Status: DC

## 2022-03-16 NOTE — Patient Instructions (Signed)

## 2022-03-16 NOTE — Progress Notes (Signed)
Virtual Visit Consent   Brittney Cochran, you are scheduled for a virtual visit with a [facility] provider today. Just as with appointments in the office, your consent must be obtained to participate. Your consent will be active for this visit and any virtual visit you may have with one of our providers in the next 365 days. If you have a MyChart account, a copy of this consent can be sent to you electronically.  As this is a virtual visit, video technology does not allow for your provider to perform a traditional examination. This may limit your provider's ability to fully assess your condition. If your provider identifies any concerns that need to be evaluated in person or the need to arrange testing (such as labs, EKG, etc.), we will make arrangements to do so. Although advances in technology are sophisticated, we cannot ensure that it will always work on either your end or our end. If the connection with a video visit is poor, the visit may have to be switched to a telephone visit. With either a video or telephone visit, we are not always able to ensure that we have a secure connection.  By engaging in this virtual visit, you consent to the provision of healthcare and authorize for your insurance to be billed (if applicable) for the services provided during this visit. Depending on your insurance coverage, you may receive a charge related to this service.  I need to obtain your verbal consent now. Are you willing to proceed with your visit today? Brittney Cochran has provided verbal consent on 03/16/2022 for a virtual visit (video or telephone). Evelina Dun, FNP  Date: 03/16/2022 8:40 AM  Virtual Visit via Video Note   I, Evelina Dun, connected with  Brittney Cochran  ([id number], May 06, 1940) on 03/16/22 at  8:45 AM EST by a video-enabled telemedicine application and verified that I am speaking with the correct person using two identifiers.  Location: Patient: Virtual Visit Location Patient:  Home Provider: Virtual Visit Location Provider: Home Office   I discussed the limitations of evaluation and management by telemedicine and the availability of in person appointments. The patient expressed understanding and agreed to proceed.    History of Present Illness:  Brittney Cochran is a 82 y.o. who identifies as a female who was assigned female at birth, and is being seen today for COVID. She reports her symptoms started 03/04/22. She reports she did not see anyone to get medication. She reports she continues to be sick.   HPI: URI  This is a new problem. The current episode started 1 to 4 weeks ago. The problem has been gradually worsening. There has been no fever. Associated symptoms include congestion, coughing, ear pain, headaches, joint pain, nausea, rhinorrhea, sinus pain, sneezing and a sore throat. She has tried acetaminophen, decongestant and increased fluids for the symptoms. The treatment provided mild relief.    Problems:  Patient Active Problem List   Diagnosis Date Noted   Fracture Risk Assessment Score (FRAX) indicating greater than 3% risk for hip fracture 10/18/2021   Hemorrhagic cerebrovascular accident (CVA) ([place]) 04/20/2018   Subclinical hypothyroidism 03/24/2017   DJD (degenerative joint disease), cervical 05/12/2016   Foraminal stenosis of cervical region 08/18/2015   Chronic pain of both knees 02/12/2015   Major depression, recurrent, chronic ([place]) 02/12/2015   [place]ker diverticulum 05/08/2014   Osteopenia 06/08/2012   Coronary artery disease involving native coronary artery of native heart without angina pectoris 05/16/2012   Generalized anxiety  disorder 01/10/2012   History of non-ST elevation myocardial infarction (NSTEMI) 12/05/2011   Gastro-esophageal reflux disease without esophagitis 08/12/2011   Mixed hyperlipidemia 12/31/2010   Osteoarthritis, hand 12/31/2010   Rosacea 12/31/2010   Paroxysmal atrial fibrillation ([place]) 12/14/2010   Essential  hypertension 01/09/2009    Allergies:  Allergies  Allergen Reactions   Clindamycin Rash   Medications:  Current Outpatient Medications:    amoxicillin-clavulanate (AUGMENTIN) 875-125 MG tablet, Take 1 tablet by mouth 2 (two) times daily., Disp: 14 tablet, Rfl: 0   ALPRAZolam (XANAX) 0.5 MG tablet, Take 1 tablet (0.5 mg total) by mouth daily as needed., Disp: 30 tablet, Rfl: 0   aspirin EC 81 MG tablet, Take 81 mg by mouth daily. Swallow whole., Disp: , Rfl:    carvedilol (COREG) 12.5 MG tablet, TK 1 T PO BID WITH MEALS, Disp: , Rfl: 3   Co-Enzyme Q-10 30 MG CAPS, Take by mouth., Disp: , Rfl:    ELIQUIS 5 MG TABS tablet, Take 5 mg by mouth 2 (two) times daily., Disp: , Rfl:    fluticasone (FLONASE) 50 MCG/ACT nasal spray, USE 1 SPRAY NASALLY DAILY, Disp: 16 g, Rfl: 3   lisinopril (PRINIVIL,ZESTRIL) 2.5 MG tablet, Take 2.5 mg by mouth daily. , Disp: , Rfl: 2   nitroGLYCERIN (NITROSTAT) 0.4 MG SL tablet, Place 0.4 mg under the tongue., Disp: , Rfl:    omeprazole (PRILOSEC) 40 MG capsule, TAKE 1 CAPSULE(40 MG) BY MOUTH DAILY, Disp: 90 capsule, Rfl: 3   rosuvastatin (CRESTOR) 40 MG tablet, Take 40 mg by mouth daily. , Disp: , Rfl:    sertraline (ZOLOFT) 100 MG tablet, Take 1.5 tablets (150 mg total) by mouth daily., Disp: 135 tablet, Rfl: 3   VITAMIN D, CHOLECALCIFEROL, PO, Take by mouth., Disp: , Rfl:   Observations/Objective: Patient is well-developed, well-nourished in no acute distress.  Resting comfortably  at home.  Head is normocephalic, atraumatic.  No labored breathing.  Speech is clear and coherent with logical content.  Patient is alert and oriented at baseline.  Nasal congestion hoarse voice  Assessment and Plan: 1. History of COVID-19  2. Acute sinusitis, recurrence not specified, unspecified location - amoxicillin-clavulanate (AUGMENTIN) 875-125 MG tablet; Take 1 tablet by mouth 2 (two) times daily.  Dispense: 14 tablet; Refill: 0  - Take meds as prescribed - Use a  cool mist humidifier  -Use saline nose sprays frequently -Force fluids -For any cough or congestion  Use plain Mucinex- regular strength or max strength is fine -For fever or aces or pains- take tylenol or ibuprofen. -Throat lozenges if help -Follow up if symptoms worsen or do not improve   Follow Up Instructions: I discussed the assessment and treatment plan with the patient. The patient was provided an opportunity to ask questions and all were answered. The patient agreed with the plan and demonstrated an understanding of the instructions.  A copy of instructions were sent to the patient via MyChart unless otherwise noted below.     The patient was advised to call back or seek an in-person evaluation if the symptoms worsen or if the condition fails to improve as anticipated.  Time:  I spent 6 minutes with the patient via telehealth technology discussing the above problems/concerns.    Evelina Dun, FNP

## 2022-04-13 ENCOUNTER — Ambulatory Visit (INDEPENDENT_AMBULATORY_CARE_PROVIDER_SITE_OTHER): Payer: Medicare Other | Admitting: Family Medicine

## 2022-04-13 ENCOUNTER — Encounter: Payer: Self-pay | Admitting: Family Medicine

## 2022-04-13 VITALS — BP 100/64 | HR 92 | Temp 98.6°F | Ht 62.0 in | Wt 134.2 lb

## 2022-04-13 DIAGNOSIS — K225 Diverticulum of esophagus, acquired: Secondary | ICD-10-CM

## 2022-04-13 DIAGNOSIS — R1310 Dysphagia, unspecified: Secondary | ICD-10-CM | POA: Diagnosis not present

## 2022-04-13 DIAGNOSIS — E782 Mixed hyperlipidemia: Secondary | ICD-10-CM | POA: Diagnosis not present

## 2022-04-13 DIAGNOSIS — I10 Essential (primary) hypertension: Secondary | ICD-10-CM | POA: Diagnosis not present

## 2022-04-13 DIAGNOSIS — I480 Paroxysmal atrial fibrillation: Secondary | ICD-10-CM | POA: Diagnosis not present

## 2022-04-13 DIAGNOSIS — I252 Old myocardial infarction: Secondary | ICD-10-CM

## 2022-04-13 DIAGNOSIS — R5383 Other fatigue: Secondary | ICD-10-CM | POA: Diagnosis not present

## 2022-04-13 DIAGNOSIS — E038 Other specified hypothyroidism: Secondary | ICD-10-CM

## 2022-04-13 DIAGNOSIS — K219 Gastro-esophageal reflux disease without esophagitis: Secondary | ICD-10-CM | POA: Diagnosis not present

## 2022-04-13 DIAGNOSIS — R630 Anorexia: Secondary | ICD-10-CM | POA: Diagnosis not present

## 2022-04-13 DIAGNOSIS — I2510 Atherosclerotic heart disease of native coronary artery without angina pectoris: Secondary | ICD-10-CM | POA: Diagnosis not present

## 2022-04-13 LAB — COMPREHENSIVE METABOLIC PANEL
ALT: 58 U/L — ABNORMAL HIGH (ref 0–35)
AST: 113 U/L — ABNORMAL HIGH (ref 0–37)
Albumin: 3.7 g/dL (ref 3.5–5.2)
Alkaline Phosphatase: 105 U/L (ref 39–117)
BUN: 21 mg/dL (ref 6–23)
CO2: 26 mEq/L (ref 19–32)
Calcium: 8.9 mg/dL (ref 8.4–10.5)
Chloride: 99 mEq/L (ref 96–112)
Creatinine, Ser: 1.28 mg/dL — ABNORMAL HIGH (ref 0.40–1.20)
GFR: 38.91 mL/min — ABNORMAL LOW (ref >60.00)
Glucose, Bld: 110 mg/dL — ABNORMAL HIGH (ref 70–99)
Potassium: 3.8 mEq/L (ref 3.5–5.1)
Sodium: 137 mEq/L (ref 135–145)
Total Bilirubin: 0.6 mg/dL (ref 0.2–1.2)
Total Protein: 6.2 g/dL (ref 6.0–8.3)

## 2022-04-13 LAB — LIPID PANEL
Cholesterol: 90 mg/dL (ref 0–200)
HDL: 24.7 mg/dL — ABNORMAL LOW (ref >39.00)
LDL Cholesterol: 45 mg/dL (ref 0–99)
NonHDL: 64.8
Total CHOL/HDL Ratio: 4
Triglycerides: 97 mg/dL (ref 0.0–149.0)
VLDL: 19.4 mg/dL (ref 0.0–40.0)

## 2022-04-13 LAB — CBC WITH DIFFERENTIAL/PLATELET
Basophils Absolute: 0 10*3/uL (ref 0.0–0.1)
Basophils Relative: 0.7 % (ref 0.0–3.0)
Eosinophils Absolute: 0.1 10*3/uL (ref 0.0–0.7)
Eosinophils Relative: 1.5 % (ref 0.0–5.0)
HCT: 36.7 % (ref 36.0–46.0)
Hemoglobin: 12.1 g/dL (ref 12.0–15.0)
Lymphocytes Relative: 14.2 % (ref 12.0–46.0)
Lymphs Abs: 0.7 10*3/uL (ref 0.7–4.0)
MCHC: 32.9 g/dL (ref 30.0–36.0)
MCV: 85.7 fl (ref 78.0–100.0)
Monocytes Absolute: 1.1 10*3/uL — ABNORMAL HIGH (ref 0.1–1.0)
Monocytes Relative: 21.9 % — ABNORMAL HIGH (ref 3.0–12.0)
Neutro Abs: 3 10*3/uL (ref 1.4–7.7)
Neutrophils Relative %: 61.7 % (ref 43.0–77.0)
Platelets: 184 10*3/uL (ref 150.0–400.0)
RBC: 4.29 Mil/uL (ref 3.87–5.11)
RDW: 14.8 % (ref 11.5–15.5)
WBC: 4.8 10*3/uL (ref 4.0–10.5)

## 2022-04-13 LAB — HEMOGLOBIN A1C: Hgb A1c MFr Bld: 7 % — ABNORMAL HIGH (ref 4.6–6.5)

## 2022-04-13 LAB — SEDIMENTATION RATE: Sed Rate: 58 mm/hr — ABNORMAL HIGH (ref 0–30)

## 2022-04-13 LAB — TSH: TSH: 13.09 u[IU]/mL — ABNORMAL HIGH (ref 0.35–5.50)

## 2022-04-13 LAB — T4, FREE: Free T4: 1 ng/dL (ref 0.60–1.60)

## 2022-04-13 NOTE — Patient Instructions (Signed)
Please follow up as scheduled for your next visit with me: 04/20/2022   I will release your lab results to you on your MyChart account with further instructions. You may see the results before I do, but when I review them I will send you a message with my report or have my assistant call you if things need to be discussed. Please reply to my message with any questions. Thank you!   Please see if you can find the name of the GI doctor you used to see: [facility] maybe?  If you have any questions or concerns, please don't hesitate to send me a message via MyChart or call the office at [phone]. Thank you for visiting with Brittney Cochran today! It's our pleasure caring for you.

## 2022-04-13 NOTE — Progress Notes (Signed)
Subjective  CC:  Chief Complaint  Patient presents with   Anorexia    Pt sated that she has not had an appetite since Christmas    HPI: Brittney Cochran is a 82 y.o. female who presents to the office today to address the problems listed above in the chief complaint. 82 year old with atrial fibrillation, hypertension, history of subclinical hypothyroidism no longer on supplements, on oral anticoagulation, chronic GERD on PPI presents due to malaise and decreased appetite over the last month.  Was diagnosed with COVID around that time.  Respiratory symptoms have resolved but has persistent low appetite, describes symptoms of dysphagia and increasing heartburn to the point of gagging and vomiting.  Has history of Zenker's diverticulum which was followed by GI doctor in [place] although she cannot remember the name.  Denies melena but reports alternating constipation and loose stools.  Also reports increased thirst.  She is fatigued without edema.  No chest pain or shortness of breath.  No fevers.  Weight is down due to lack of appetite and poor oral intake. CAD: Reviewed recent cardiology notes.  No angina.  Scheduled for ablation therapy for her atrial fibrillation in March.  Remains on Eliquis Hyperlipidemia treated with statin.  Denies myalgias Assessment  1. Lack of appetite   2. Paroxysmal atrial fibrillation (HCC)   3. Other fatigue   4. Zenker diverticulum   5. Subclinical hypothyroidism   6. Gastro-esophageal reflux disease without esophagitis   7. Dysphagia, unspecified type   8. Mixed hyperlipidemia   9. History of non-ST elevation myocardial infarction (NSTEMI)   10. Coronary artery disease involving native coronary artery of native heart without angina pectoris   11. Essential hypertension      Plan  Symptom complex of malaise and lack of appetite with dysphagia and fatigue: Possible hypothyroidism, check lab work.  Check CBC and chemistry panel.  Could be prolonged  fatigue due to COVID.  Vital signs are stable.  Check blood work and she has follow-up next week to go over results.  Rule out diabetes given polydipsia. GERD and dysphagia with Zenker diverticulum: Continue PPI and will likely need to get back to GI for further evaluation. Recheck lipids on statin with LFTs Blood pressure borderline low without symptoms of orthostatic changes.  Will monitor closely.  May need medication adjustment. A-fib: Continue anticoagulation.  Rule out anemia.  Follow-up with cardiology for ablation therapy  I spent a total of 44 minutes for this patient encounter. Time spent included preparation, face-to-face counseling with the patient and coordination of care, review of chart and records, and documentation of the encounter.  Follow up: Next week for physical and follow-up 04/20/2022  Orders Placed This Encounter  Procedures   CBC with Differential/Platelet   Comprehensive metabolic panel   Sedimentation rate   Hemoglobin A1c   Lipid panel   T3   T4, free   TSH   No orders of the defined types were placed in this encounter.     I reviewed the patients updated PMH, FH, and SocHx.    Patient Active Problem List   Diagnosis Date Noted   Hemorrhagic cerebrovascular accident (CVA) ([place]) 04/20/2018    Priority: High   Major depression, recurrent, chronic ([place]) 02/12/2015    Priority: High   Coronary artery disease involving native coronary artery of native heart without angina pectoris 05/16/2012    Priority: High   Generalized anxiety disorder 01/10/2012    Priority: High   History of non-ST elevation  myocardial infarction (NSTEMI) 12/05/2011    Priority: High   Mixed hyperlipidemia 12/31/2010    Priority: High   Paroxysmal atrial fibrillation ([place]) 12/14/2010    Priority: High   Essential hypertension 01/09/2009    Priority: High   Subclinical hypothyroidism 03/24/2017    Priority: Medium    DJD (degenerative joint disease), cervical 05/12/2016     Priority: Medium    Foraminal stenosis of cervical region 08/18/2015    Priority: Medium    Chronic pain of both knees 02/12/2015    Priority: Medium    Zenker diverticulum 05/08/2014    Priority: Medium    Osteopenia 06/08/2012    Priority: Medium    Gastro-esophageal reflux disease without esophagitis 08/12/2011    Priority: Medium    Osteoarthritis, hand 12/31/2010    Priority: Low   Rosacea 12/31/2010    Priority: Low   Fracture Risk Assessment Score (FRAX) indicating greater than 3% risk for hip fracture 10/18/2021   Current Meds  Medication Sig   ALPRAZolam (XANAX) 0.5 MG tablet Take 1 tablet (0.5 mg total) by mouth daily as needed.   aspirin EC 81 MG tablet Take 81 mg by mouth daily. Swallow whole.   carvedilol (COREG) 12.5 MG tablet TK 1 T PO BID WITH MEALS   Co-Enzyme Q-10 30 MG CAPS Take by mouth.   ELIQUIS 5 MG TABS tablet Take 5 mg by mouth 2 (two) times daily.   fluticasone (FLONASE) 50 MCG/ACT nasal spray USE 1 SPRAY NASALLY DAILY   lisinopril (PRINIVIL,ZESTRIL) 2.5 MG tablet Take 2.5 mg by mouth daily.    nitroGLYCERIN (NITROSTAT) 0.4 MG SL tablet Place 0.4 mg under the tongue.   omeprazole (PRILOSEC) 40 MG capsule TAKE 1 CAPSULE(40 MG) BY MOUTH DAILY   rosuvastatin (CRESTOR) 40 MG tablet Take 40 mg by mouth daily.    sertraline (ZOLOFT) 100 MG tablet Take 1.5 tablets (150 mg total) by mouth daily.   VITAMIN D, CHOLECALCIFEROL, PO Take by mouth.    Allergies: Patient is allergic to clindamycin. Family History: Patient family history includes Alcohol abuse in her father; Atrial fibrillation in her mother; Diabetes in her brother, father, and paternal grandmother; Early death in her father and maternal grandfather; Heart attack in her father, maternal uncle, and paternal grandmother; Kidney disease in her maternal grandmother; Stroke in her mother, paternal aunt, and son; Transient ischemic attack in her mother. Social History:  Patient  reports that she has never  smoked. She has never used smokeless tobacco. She reports current alcohol use of about 2.0 - 3.0 standard drinks of alcohol per week. She reports that she does not use drugs.  Review of Systems: Constitutional: Negative for fever malaise or anorexia Cardiovascular: negative for chest pain Respiratory: negative for SOB or persistent cough Gastrointestinal: negative for abdominal pain  Objective  Vitals: BP 100/64   Pulse 92   Temp 98.6 F (37 C)   Ht '5\' 2"'$  (1.575 m)   Wt 134 lb 3.2 oz (60.9 kg)   SpO2 98%   BMI 24.55 kg/m  General: no acute distress , A&Ox3, appears fatigued HEENT: PEERL, conjunctiva normal, neck is supple, no thyromegaly- Cardiovascular:  RRR without murmur or gallop.  Respiratory:  Good breath sounds bilaterally, CTAB with normal respiratory effort Abdomen: Midepigastric tenderness without rebound guarding or masses present.  No hepatosplenomegaly. Skin:  Warm, no rashes Extremities, no edema, no tremor Commons side effects, risks, benefits, and alternatives for medications and treatment plan prescribed today were discussed, and the patient  expressed understanding of the given instructions. Patient is instructed to call or message via MyChart if he/she has any questions or concerns regarding our treatment plan. No barriers to understanding were identified. We discussed Red Flag symptoms and signs in detail. Patient expressed understanding regarding what to do in case of urgent or emergency type symptoms.  Medication list was reconciled, printed and provided to the patient in AVS. Patient instructions and summary information was reviewed with the patient as documented in the AVS. This note was prepared with assistance of Dragon voice recognition software. Occasional wrong-word or sound-a-like substitutions may have occurred due to the inherent limitations of voice recognition software

## 2022-04-14 ENCOUNTER — Other Ambulatory Visit: Payer: Self-pay

## 2022-04-14 DIAGNOSIS — E039 Hypothyroidism, unspecified: Secondary | ICD-10-CM

## 2022-04-14 LAB — T3: T3, Total: 115 ng/dL (ref 76–181)

## 2022-04-14 MED ORDER — LEVOTHYROXINE SODIUM 50 MCG PO TABS: 50.0000 ug | ORAL_TABLET | Freq: Every day | ORAL | 3 refills | Status: DC

## 2022-04-20 ENCOUNTER — Telehealth: Payer: Self-pay | Admitting: Family Medicine

## 2022-04-20 ENCOUNTER — Ambulatory Visit (INDEPENDENT_AMBULATORY_CARE_PROVIDER_SITE_OTHER): Payer: Medicare Other | Admitting: Family Medicine

## 2022-04-20 ENCOUNTER — Encounter: Payer: Self-pay | Admitting: Family Medicine

## 2022-04-20 VITALS — BP 100/62 | HR 102 | Temp 98.6°F | Ht 62.0 in | Wt 140.0 lb

## 2022-04-20 DIAGNOSIS — R1084 Generalized abdominal pain: Secondary | ICD-10-CM

## 2022-04-20 DIAGNOSIS — R700 Elevated erythrocyte sedimentation rate: Secondary | ICD-10-CM | POA: Diagnosis not present

## 2022-04-20 DIAGNOSIS — Z0000 Encounter for general adult medical examination without abnormal findings: Secondary | ICD-10-CM | POA: Diagnosis not present

## 2022-04-20 DIAGNOSIS — N289 Disorder of kidney and ureter, unspecified: Secondary | ICD-10-CM | POA: Diagnosis not present

## 2022-04-20 DIAGNOSIS — R7989 Other specified abnormal findings of blood chemistry: Secondary | ICD-10-CM

## 2022-04-20 DIAGNOSIS — E039 Hypothyroidism, unspecified: Secondary | ICD-10-CM | POA: Diagnosis not present

## 2022-04-20 DIAGNOSIS — K225 Diverticulum of esophagus, acquired: Secondary | ICD-10-CM | POA: Diagnosis not present

## 2022-04-20 DIAGNOSIS — R1310 Dysphagia, unspecified: Secondary | ICD-10-CM | POA: Diagnosis not present

## 2022-04-20 DIAGNOSIS — E119 Type 2 diabetes mellitus without complications: Secondary | ICD-10-CM | POA: Diagnosis not present

## 2022-04-20 NOTE — Telephone Encounter (Signed)
Pt forgot to ask if she needs to drink protein drinks like Boost because she is having trouble eating and having an appetite. Please advise and call pt back with detail.

## 2022-04-20 NOTE — Progress Notes (Signed)
Subjective  Chief Complaint  Patient presents with   Annual Exam   Hypertension    HPI: Brittney Cochran is a 82 y.o. female who presents to [facility] today for a Female Wellness Visit. She also has the concerns and/or needs as listed above in the chief complaint. These will be addressed in addition to the Health Maintenance Visit.   Wellness Visit: annual visit with health maintenance review and exam without Pap  HM: defer imms today given illness. See below Chronic disease f/u and/or acute problem visit: (deemed necessary to be done in addition to the wellness visit): See note from last week.  Patient continues to do poorly.  Started back in December with COVID.  Persistent malaise, decreased appetite, weight loss, complains of pain in her neck chest and abdomen.  Reports problems swallowing.  Eating but only taking small breaks.  Has history of Zenker's diverticulum.  No melena or vomiting. Little po intake. Recent lab work revealed elevated lfts (AST 2x ALT- rare alcohol/tylenol), mild renal insufficiency, mild sed rate elevation of 56 (ULN 46 at her age), and new onset diabetes and elevated TSH at 48. We started synthroid 50 dialy    Assessment  1. Annual physical exam   2. Hypothyroidism (acquired)   3. Essential hypertension   4. Paroxysmal atrial fibrillation (HCC)   5. Elevated liver function tests   6. Type 2 diabetes mellitus without complication, without long-term current use of insulin (HCC)   7. Dysphagia, unspecified type      Plan  Female Wellness Visit: Age appropriate Health Maintenance and Prevention measures were discussed with patient. Included topics are cancer screening recommendations, ways to keep healthy (see AVS) including dietary and exercise recommendations, regular eye and dental care, use of seat belts, and avoidance of moderate alcohol use and tobacco use.  BMI: discussed patient's BMI and encouraged positive lifestyle  modifications to help get to or maintain a target BMI. HM needs and immunizations were addressed and ordered. See below for orders. See HM and immunization section for updates. Routine labs and screening tests ordered including cmp, cbc and lipids where appropriate. Discussed recommendations regarding Vit D and calcium supplementation (see AVS)  Chronic disease management visit and/or acute problem visit: Abd pain, dysphagia and malaise: unclear etiology. Could be due covid/long hauler but needs further eval. Refer to GI. Check abd ultrasound; may need CT scan. Hydrate, avoid nsaids,tylenol and alcohol. Stop all sweetened beverages.  Treat thyroid Close f/u To ER if worsening.  Follow up: 4-6 weeks for recheck   No orders of the defined types were placed in this encounter.  No orders of the defined types were placed in this encounter.     Body mass index is 25.61 kg/m. Wt Readings from Last 3 Encounters:  04/20/22 140 lb (63.5 kg)  04/13/22 134 lb 3.2 oz (60.9 kg)  10/18/21 143 lb 6.4 oz (65 kg)     Patient Active Problem List   Diagnosis Date Noted   Hemorrhagic cerebrovascular accident (CVA) ([place]) 04/20/2018    Priority: High    Left frontal lobe by MRI 03/2018; Dr. Saintclair Halsted [facility] Secondary subdural hematoma    Major depression, recurrent, chronic ([place]) 02/12/2015    Priority: High   Coronary artery disease involving native coronary artery of native heart without angina pectoris 05/16/2012    Priority: High    Cardiac MRI - EF normalized to 55% from 40%, no wall motion abnormalities nor scarring For lexiscan  stress test 07/2013 - unremarkable/cla     Generalized anxiety disorder 01/10/2012    Priority: High    Triggers are winter months, snow, darkness, family problems: has panic sxs. Controlled on prn xanax and zolft.     History of non-ST elevation myocardial infarction (NSTEMI) 12/05/2011    Priority: High    Overview:  12/02/11 - [place] [facility], s/p PTCA RCA,  nonSTEMI EF 60%    Mixed hyperlipidemia 12/31/2010    Priority: High    Overview:  Goal LDL < 80; cards managing    Paroxysmal atrial fibrillation ([place]) 12/14/2010    Priority: High    No recent paf; no longer on anticoagulation per cards due to h/o cva hemorrhagic Heart doc said ok to use celebrex Recurrent 2023: restarted eloquis; scheduled for ablation 05/2022     Essential hypertension 01/09/2009    Priority: High   Subclinical hypothyroidism 03/24/2017    Priority: Medium     Started synthroid 25 03/2017 for increasing TSH.    DJD (degenerative joint disease), cervical 05/12/2016    Priority: Medium    Foraminal stenosis of cervical region 08/18/2015    Priority: Medium     Overview:  xrays 2017; multilevel DJD    Chronic pain of both knees 02/12/2015    Priority: Medium    Zenker diverticulum 05/08/2014    Priority: Medium     Overview:  By upper GI study.  GI is following.  Patient hoping to avoid surgical repair.  2016    Osteopenia 06/08/2012    Priority: Medium     Overview:  DEXA 2016 T=-0.2 lumbar spine, -1.6 at left femur. Stable 2016, mild worsening Dexa 07/2017: stable at femur; osteopenia. Recheck 2 years.  DEXA 09/2021: osteopenia, T = -2.2, recheck 2 years, FRAX score 17% major frx, 5.8% hip, discussed biphosphonate    Gastro-esophageal reflux disease without esophagitis 08/12/2011    Priority: Medium    Osteoarthritis, hand 12/31/2010    Priority: Low   Rosacea 12/31/2010    Priority: Low   Hypothyroidism (acquired) 04/20/2022   Fracture Risk Assessment Score (FRAX) indicating greater than 3% risk for hip fracture 10/18/2021   Health Maintenance  Topic Date Due   Zoster Vaccines- Shingrix (1 of 2) Never done   DTaP/Tdap/Td (3 - Td or Tdap) 03/14/2016   INFLUENZA VACCINE  10/12/2021   COVID-19 Vaccine (3 - Moderna risk series) 04/29/2022 (Originally 06/05/2019)   Diabetic kidney evaluation - Urine ACR  04/21/2027 (Originally 05/20/1958)   MAMMOGRAM   09/18/2022   Medicare Annual Wellness (AWV)  10/19/2022   Diabetic kidney evaluation - eGFR measurement  04/14/2023   DEXA SCAN  09/18/2023   Pneumonia Vaccine 54+ Years old  Completed   HPV VACCINES  Aged Out   Immunization History  Administered Date(s) Administered   Fluad Quad(high Dose 65+) 12/16/2016, 11/29/2018, 01/08/2020, 01/05/2021   Influenza Split 01/11/2007, 02/04/2008, 01/01/2009, 04/19/2010, 12/14/2010, 01/12/2011   Influenza, High Dose Seasonal PF 11/29/2011, 12/18/2012, 12/10/2013, 02/02/2015, 12/16/2016, 12/01/2017, 01/08/2020, 01/05/2021   Influenza, Seasonal, Injecte, Preservative Fre 03/14/2009, 12/08/2015   Influenza,inj,quad, With Preservative 03/14/2009   Influenza-Unspecified 01/11/2007, 02/04/2008, 01/01/2009, 03/14/2009, 04/19/2010, 12/14/2010, 01/12/2011, 12/08/2015   Moderna Sars-Covid-2 Vaccination 04/12/2019, 05/08/2019   Pneumococcal Conjugate-13 04/08/2014   Pneumococcal Polysaccharide-23 01/01/2009, 03/14/2009   Pneumococcal-Unspecified 03/14/2009   Tdap 03/14/2004, 03/14/2006   We updated and reviewed the patient's past history in detail and it is documented below. Allergies: Patient is allergic to clindamycin. Past Medical History Patient  has a past  medical history of Atrial fibrillation ([place]), Coronary artery disease, GERD (gastroesophageal reflux disease), Hyperlipemia, Leiomyoma of body of uterus, Long term current use of anticoagulant therapy (07/07/2015), Osteopenia, Stroke [facility]), and Subclinical hypothyroidism (03/24/2017). Past Surgical History Patient  has a past surgical history that includes Breast biopsy (Left, 08/19/2013); Ablation of dysrhythmic focus; Bladder repair (2011); Tubal ligation (1985); Floating Kidney [phone]); Carotid stent (12/04/2011); and Esophageal dilation (2013). Family History: Patient family history includes Alcohol abuse in her father; Atrial fibrillation in her mother; Diabetes in her brother, father, and paternal  grandmother; Early death in her father and maternal grandfather; Heart attack in her father, maternal uncle, and paternal grandmother; Kidney disease in her maternal grandmother; Stroke in her mother, paternal aunt, and son; Transient ischemic attack in her mother. Social History:  Patient  reports that she has never smoked. She has never used smokeless tobacco. She reports current alcohol use of about 2.0 - 3.0 standard drinks of alcohol per week. She reports that she does not use drugs.  Review of Systems: Constitutional: negative for fever or malaise Ophthalmic: negative for photophobia, double vision or loss of vision Cardiovascular: negative for chest pain, dyspnea on exertion, or new LE swelling Respiratory: negative for SOB or persistent cough Gastrointestinal: negative for abdominal pain, change in bowel habits or melena Genitourinary: negative for dysuria or gross hematuria, no abnormal uterine bleeding or disharge Musculoskeletal: negative for new gait disturbance or muscular weakness Integumentary: negative for new or persistent rashes, no breast lumps Neurological: negative for TIA or stroke symptoms Psychiatric: negative for SI or delusions Allergic/Immunologic: negative for hives  Patient Care Team    Relationship Specialty Notifications Start End  Leamon Arnt, MD PCP - General Family Medicine  03/21/17   Kary Kos, MD Consulting Physician Neurosurgery  03/26/18   Vickii Chafe., DO Referring Physician Cardiology  03/26/18   Ebbie Ridge, MD Referring Physician Cardiology  03/26/18     Objective  Vitals: BP 100/62   Pulse (!) 102   Temp 98.6 F (37 C)   Ht '5\' 2"'$  (1.575 m)   Wt 140 lb (63.5 kg)   SpO2 95%   BMI 25.61 kg/m  General:  appears frail and tired. Unwell. Non-toxicd Psych:  Alert and orientedx3,normal mood and affect HEENT:  Normocephalic, atraumatic, non-icteric sclera,  supple neck without adenopathy, mass or thyromegaly Cardiovascular:  Normal  S1, S2, RRR without gallop, rub or murmur Respiratory:  Good breath sounds bilaterally, CTAB with normal respiratory effort Gastrointestinal: normal bowel sounds, soft, non-tender, mild diffuse ttp w/o rebound or guarding, no noted masses. No HSM, has small mobile lipoma like mass in upper abdominal wall. Ext w/o edema  Commons side effects, risks, benefits, and alternatives for medications and treatment plan prescribed today were discussed, and the patient expressed understanding of the given instructions. Patient is instructed to call or message via MyChart if he/she has any questions or concerns regarding our treatment plan. No barriers to understanding were identified. We discussed Red Flag symptoms and signs in detail. Patient expressed understanding regarding what to do in case of urgent or emergency type symptoms.  Medication list was reconciled, printed and provided to the patient in AVS. Patient instructions and summary information was reviewed with the patient as documented in the AVS. This note was prepared with assistance of Dragon voice recognition software. Occasional wrong-word or sound-a-like substitutions may have occurred due to the inherent limitations of voice recognition software

## 2022-04-20 NOTE — Patient Instructions (Signed)
Please return in 6 weeks for recheck.   I have ordered an ultrasound of your abdomen and referred you to GI in [place] to further evaluate your abdominal pain and difficulty swallowing.   If you get worse, go to ER (more pain or weakness) Avoid sweets.   If you have any questions or concerns, please don't hesitate to send me a message via MyChart or call the office at [phone]. Thank you for visiting with [place] today! It's our pleasure caring for you.

## 2022-04-20 NOTE — Telephone Encounter (Signed)
Message sent to pt thru [facility]

## 2022-04-25 ENCOUNTER — Encounter: Payer: Self-pay | Admitting: Nurse Practitioner

## 2022-05-04 ENCOUNTER — Ambulatory Visit
Admission: RE | Admit: 2022-05-04 | Discharge: 2022-05-04 | Disposition: A | Discharge status: Home / Self Care | Payer: Medicare Other | Source: Ambulatory Visit | Attending: Family Medicine | Admitting: Family Medicine

## 2022-05-04 DIAGNOSIS — R109 Unspecified abdominal pain: Secondary | ICD-10-CM | POA: Diagnosis not present

## 2022-05-04 DIAGNOSIS — N289 Disorder of kidney and ureter, unspecified: Secondary | ICD-10-CM

## 2022-05-04 DIAGNOSIS — R7989 Other specified abnormal findings of blood chemistry: Secondary | ICD-10-CM

## 2022-05-04 DIAGNOSIS — R700 Elevated erythrocyte sedimentation rate: Secondary | ICD-10-CM

## 2022-05-04 DIAGNOSIS — R1084 Generalized abdominal pain: Secondary | ICD-10-CM

## 2022-05-09 ENCOUNTER — Ambulatory Visit: Payer: Self-pay | Admitting: *Deleted

## 2022-05-09 NOTE — Telephone Encounter (Signed)
Patient called to request ultrasound results. Please advise and contact patient regarding results. Patient is having difficulty using My Chart to access information.  Reason for Disposition  Nursing judgment or information in reference  Answer Assessment - Initial Assessment Questions 1. REASON FOR CALL: "What is your main concern right now?"     Requesting results of ultrasound 2. ONSET: "When did the na start?"     na 3. SEVERITY: "How bad is the na?"     na 4. FEVER: "Do you have a fever?"     na 5. OTHER SYMPTOMS: "Do you have any other new symptoms?"     na 6. TREATMENTS AND RESPONSE: "What have you done so far to try to make this better? What medicines have you used?"     na 7. PREGNANCY: "Is there any chance you are pregnant?" "When was your last menstrual period?"     na  Protocols used: No Guideline Available-A-AH

## 2022-05-18 NOTE — Progress Notes (Addendum)
05/18/2022 Brittney Cochran [id number] 05-30-1940   CHIEF COMPLAINT: Difficulty swallowing   HISTORY OF PRESENT ILLNESS: Brittney Cochran. Brittney Cochran is an 82 year old female with a past medical history of hyperlipidemia, atrial fibrillation s/p ablation 2010 on Eliquis, coronary artery disease s/p PTCA with coronary stent placement 2013, CVA 03/2018, diabetes mellitus II, osteopenia and GERD. She presents to our office today as referred by Dr. Dierdre Forth for further evaluation regarding dysphagia, abdominal pain and elevated LFTs.  She describes having digestive issues from the time food enters her mouth and exits the rectum. Since around 03/07/2022, she feels food gets stuck in her throat, has to pass around something that is blocking the passage which sometimes results in gagging and vomiting up with the stuck food. She described having difficulty swallowing almost daily for the first 4 weeks which has slowly improved but has not abated. Last night, she ate honeydew melon which got stuck and she vomited it up. She had daily heartburn for the month of January which abated.  She has variable generalized abdominal discomfort, no severe abdominal pain. She has lost 12 pounds over the past 2 to 3 months.  No fever, sweats or chills.  She believes she underwent an EGD with esophageal dilatation by Dr. Quillian Quince in [place] many years ago.  She stated having a Zenker's diverticulum. She is on ASA 81 mg daily, no other NSAIDs.  She takes omeprazole 40 mg once daily. She passes small fingerlike stools daily.  No rectal bleeding or black stools.  She has undergone 2 or 3 colonoscopies in her lifetime which showed diverticulosis, no polyps and she believes her last colonoscopy was at least 10 years ago.  No known family history of esophageal, gastric or colorectal cancer.    She denies having any history of liver disease.  She previously drank 2 to 3 glasses of wine weekly but for the past 2 months she has avoided alcohol  use.  She was prescribed a course of Augmentin early January 2024 for sinusitis.  She forted testing positive for COVID a few months ago.  Labs 04/13/2022 showed an AST level of 113 and ALT 58.  Normal total bili and alk phos levels.     Latest Ref Rng & Units 04/13/2022   10:48 AM 04/12/2021    2:49 PM 01/18/2021    4:14 PM  CMP  Glucose 70 - 99 mg/dL 110  102  97   BUN 6 - 23 mg/dL '21  26  29   '$ Creatinine 0.40 - 1.20 mg/dL 1.28  0.97  1.02   Sodium 135 - 145 mEq/L 137  140  138   Potassium 3.5 - 5.1 mEq/L 3.8  4.9  4.6   Chloride 96 - 112 mEq/L 99  102  103   CO2 19 - 32 mEq/L 26  32  27   Calcium 8.4 - 10.5 mg/dL 8.9  9.8  9.7   Total Protein 6.0 - 8.3 g/dL 6.2  6.4  6.4   Total Bilirubin 0.2 - 1.2 mg/dL 0.6  0.6  0.5   Alkaline Phos 39 - 117 U/L 105  72  89   AST 0 - 37 U/L 113  26  41   ALT 0 - 35 U/L 58  16  38        Latest Ref Rng & Units 04/13/2022   10:48 AM 04/12/2021    2:49 PM 01/18/2021    4:14 PM  CBC  WBC  4.0 - 10.5 K/uL 4.8  6.3  6.5   Hemoglobin 12.0 - 15.0 g/dL 12.1  13.0  12.6   Hematocrit 36.0 - 46.0 % 36.7  40.6  39.2   Platelets 150.0 - 400.0 K/uL 184.0  122.0  149.0      TEE 12/22/2021: Normal LV size, wall thickness, wall motion and systolic function with ejection fraction 65-70% The left ventricular wall motion is normal. Left atrial pressure is indeterminate due to 'atrial fibrillation' The right ventricle is normal in size and function. The left atrium is moderately dilated. The right atrium is mildly dilated. There is aortic valve sclerosis. There is trace aortic regurgitation. There is mild mitral regurgitation. No significant mitral valve stenosis. There is mild tricuspid regurgitation. Estimated right ventricular systolic pressure is 32 mmHg. Mild pulmonic valvular regurgitation. Right atrial pressure is estimated to be 8 mm Hg. There is no pericardial effusion. Compared to prior stress TTE on 06/16/2010, there is no significant change.    Past Medical History:  Diagnosis Date   Atrial fibrillation [facility])    Coronary artery disease    GERD (gastroesophageal reflux disease)    endoscopy and esophageal diatation 2013 Dr. Percell Miller   Hyperlipemia    Leiomyoma of body of uterus    Long term current use of anticoagulant therapy 07/07/2015   Stopped 2020; no recurrence of PAF since ablation. Stopped due to hemorrhagic cva.    Osteopenia    Stroke [facility])    Subclinical hypothyroidism 03/24/2017   Started synthroid 25 03/2017 for increasing TSH.   Past Surgical History:  Procedure Laterality Date   ABLATION OF DYSRHYTHMIC FOCUS     BLADDER REPAIR  2011   BREAST BIOPSY Left 08/19/2013   CAROTID STENT  12/04/2011   ESOPHAGEAL DILATION  2013   Floating Kidney  1963   TUBAL LIGATION  1985   Social History:  Nonsmoker. She drinks one to three glasses of red wine weekly bit topped drinking wine 2 months ago. She reports that she has never smoked. She has never used smokeless tobacco. No drug use.  Family History: family history includes Alcohol abuse in her father; Atrial fibrillation in her mother; Diabetes in her brother, father, and paternal grandmother; Early death in her father and maternal grandfather; Heart attack in her father, maternal uncle, and paternal grandmother; Kidney disease in her maternal grandmother; Stroke in her mother, paternal aunt, and son; Transient ischemic attack in her mother.    Outpatient Encounter Medications as of 05/19/2022  Medication Sig   ALPRAZolam (XANAX) 0.5 MG tablet Take 1 tablet (0.5 mg total) by mouth daily as needed.   aspirin EC 81 MG tablet Take 81 mg by mouth daily. Swallow whole.   carvedilol (COREG) 12.5 MG tablet TK 1 T PO BID WITH MEALS   Co-Enzyme Q-10 30 MG CAPS Take by mouth.   ELIQUIS 5 MG TABS tablet Take 5 mg by mouth 2 (two) times daily.   fluticasone (FLONASE) 50 MCG/ACT nasal spray USE 1 SPRAY NASALLY DAILY   levothyroxine (SYNTHROID) 50 MCG tablet Take 1 tablet (50 mcg  total) by mouth daily.   lisinopril (PRINIVIL,ZESTRIL) 2.5 MG tablet Take 2.5 mg by mouth daily.    nitroGLYCERIN (NITROSTAT) 0.4 MG SL tablet Place 0.4 mg under the tongue.   omeprazole (PRILOSEC) 40 MG capsule TAKE 1 CAPSULE(40 MG) BY MOUTH DAILY   rosuvastatin (CRESTOR) 40 MG tablet Take 40 mg by mouth daily.    sertraline (ZOLOFT) 100 MG tablet Take 1.5 tablets (  150 mg total) by mouth daily.   VITAMIN D, CHOLECALCIFEROL, PO Take by mouth.   No facility-administered encounter medications on file as of 05/19/2022.   REVIEW OF SYSTEMS:  Gen: See HPI. CV: Denies chest pain, palpitations or edema. Resp: Denies cough, shortness of breath of hemoptysis.  GI:See HPI. GU : Denies urinary burning, blood in urine, increased urinary frequency or incontinence. MS: Denies joint pain, muscles aches or weakness. Derm: Denies rash, itchiness, skin lesions or unhealing ulcers. Psych: Denies depression, anxiety, memory loss or confusion. Heme: Denies bruising, easy bleeding. Neuro:  Denies headaches, dizziness or paresthesias. Endo:  Denies any problems with DM, thyroid or adrenal function.  PHYSICAL EXAM: BP 108/64   Pulse 90   Ht 5\' 2"  (1.575 m)   Wt 128 lb (58.1 kg)   SpO2 96%   BMI 23.41 kg/m   General: 82 year old female in no acute distress. Head: Normocephalic and atraumatic. Eyes:  Sclerae non-icteric, conjunctive pink. Ears: Normal auditory acuity. Mouth: Dentition intact. No ulcers or lesions.  Neck: Supple, no lymphadenopathy or thyromegaly.  Lungs: Clear bilaterally to auscultation without wheezes, crackles or rhonchi. Heart: Regular rate and rhythm. No murmur, rub or gallop appreciated.  Abdomen: Soft, nontender, nondistended. No masses. No hepatosplenomegaly. Normoactive bowel sounds x 4 quadrants.  Rectal: Deferred.  Musculoskeletal: Symmetrical with no gross deformities. Skin: Warm and dry. No rash or lesions on visible extremities. Extremities: No edema. Neurological:  Alert oriented x 4, no focal deficits.  Psychological:  Alert and cooperative. Normal mood and affect.  ASSESSMENT AND PLAN:  82 year old female with dysphagia with solid foods which initially started around 03/07/2022 and occur daily for approximately 1 month, since then her symptoms have decreased but have not abated. -Barium swallow study to rule out esophageal dysmotility -EGD to rule out esophageal stricture/reflux benefits and risks discussed including risk with sedation, risk of bleeding, perforation and infection  -Our office will contact the patient's cardiologist to verify Eliquis hold instructions prior to proceeding with an EGD -Continue Omeprazole 40 mg p.o. daily to be taken 30 minutes before breakfast -Start Famotidine 20 mg p.o. nightly -Request copy of past EGD and colonoscopy report if obtainable -Recommend ENT evaluation if barium swallow study/EGD unrevealing  Elevated LFTs. Labs 04/13/2022 showed an AST level of 113 and ALT 58.  Normal total bili and alk phos levels. -Hepatic panel today -If repeat hepatic panel shows persistent elevated LFTs, she will require further hepatology serologies to include hepatitis A total antibody, hepatitis B surface antigen, hepatitis B surface antibody, hepatitis B core total antibody, ANA, SMA, AMA, IgG, alpha-1 antitrypsin, iron and ferritin level as well as abdominal sonogram. -Continue alcohol abstinence  Atrial fibrillation on Eliquis -Our office will contact cardiologist Dr. Clydie Braun to verify Eliquis hold instructions prior to proceeding with an EGD -Patient requiring future cardiac ablation, she will contact her office if she has any change in her cardiac status prior to her EGD date  Coronary artery disease s/p PTCA with coronary stent placement 2013. LVEF 65-70% per TEE 12/2021. No angina.    ADDENDUM: Requested EGD records received: EGD by Dr. Mckinley Jewel with [facility] 09/08/2011: The examined  esophagus was normal Dilatation was attempted at the GE junction.  The scope was withdrawn.  This was successfully dilated with a 58 [place] Maloney dilator with mild resistance. The entire examined stomach was normal.  The examined duodenum was normal.     CC:  Willow Ora, MD

## 2022-05-19 ENCOUNTER — Ambulatory Visit: Payer: Medicare Other | Admitting: Nurse Practitioner

## 2022-05-19 ENCOUNTER — Encounter: Payer: Self-pay | Admitting: Nurse Practitioner

## 2022-05-19 ENCOUNTER — Other Ambulatory Visit: Payer: Self-pay | Admitting: Nurse Practitioner

## 2022-05-19 ENCOUNTER — Other Ambulatory Visit (INDEPENDENT_AMBULATORY_CARE_PROVIDER_SITE_OTHER): Payer: Medicare Other

## 2022-05-19 VITALS — BP 108/64 | HR 90 | Ht 62.0 in | Wt 128.0 lb

## 2022-05-19 DIAGNOSIS — K219 Gastro-esophageal reflux disease without esophagitis: Secondary | ICD-10-CM

## 2022-05-19 DIAGNOSIS — R7989 Other specified abnormal findings of blood chemistry: Secondary | ICD-10-CM

## 2022-05-19 DIAGNOSIS — R1310 Dysphagia, unspecified: Secondary | ICD-10-CM

## 2022-05-19 LAB — HEPATIC FUNCTION PANEL
ALT: 33 U/L (ref 0–35)
AST: 49 U/L — ABNORMAL HIGH (ref 0–37)
Albumin: 3.6 g/dL (ref 3.5–5.2)
Alkaline Phosphatase: 105 U/L (ref 39–117)
Bilirubin, Direct: 0.2 mg/dL (ref 0.0–0.3)
Total Bilirubin: 0.5 mg/dL (ref 0.2–1.2)
Total Protein: 6.9 g/dL (ref 6.0–8.3)

## 2022-05-19 MED ORDER — FAMOTIDINE 20 MG PO TABS: 20.0000 mg | ORAL_TABLET | Freq: Every day | ORAL | 1 refills | Status: DC

## 2022-05-19 NOTE — Patient Instructions (Signed)
You have been scheduled for an endoscopy. Please follow written instructions given to you at your visit today. If you use inhalers (even only as needed), please bring them with you on the day of your procedure.  Your provider has requested that you go to the basement level for lab work before leaving today. Press "B" on the elevator. The lab is located at the first door on the left as you exit the elevator.  You have been scheduled for a Barium Esophogram at [facility] (1st floor of the hospital) on 05/24/22 at 11 am. Please arrive 15 minutes prior to your appointment for registration.  If you need to reschedule for any reason, please contact radiology at [phone] to do so.  You will be contacted by our office prior to your procedure for directions on holding your Eliquis.  If you do not hear from our office 1 week prior to your scheduled procedure, please call [phone] to discuss.     We have sent the following medications to your pharmacy for you to pick up at your convenience: Famotidine 20 mg   Due to recent changes in healthcare laws, you may see the results of your imaging and laboratory studies on MyChart before your provider has had a chance to review them.  We understand that in some cases there may be results that are confusing or concerning to you. Not all laboratory results come back in the same time frame and the provider may be waiting for multiple results in order to interpret others.  Please give [place] 48 hours in order for your provider to thoroughly review all the results before contacting the office for clarification of your results.    Thank you for trusting me with your gastrointestinal care!   Carl Best, CRNP

## 2022-05-20 ENCOUNTER — Other Ambulatory Visit: Payer: Self-pay

## 2022-05-20 DIAGNOSIS — R7989 Other specified abnormal findings of blood chemistry: Secondary | ICD-10-CM

## 2022-05-20 NOTE — Progress Notes (Signed)
Agree with the assessment and plan as outlined by Carl Best, NP.   If imaging shows Zenker's diverticulum, recommend ENT referral now.  Otherwise, agree with plan for medication adjustment, imaging, and Eliquis hold in anticipation of EGD.   Gerrit Heck, DO, [facility]

## 2022-05-24 ENCOUNTER — Ambulatory Visit (HOSPITAL_COMMUNITY)
Admission: RE | Admit: 2022-05-24 | Discharge: 2022-05-24 | Disposition: A | Discharge status: Home / Self Care | Payer: Medicare Other | Source: Ambulatory Visit | Attending: Nurse Practitioner | Admitting: Nurse Practitioner

## 2022-05-24 DIAGNOSIS — K219 Gastro-esophageal reflux disease without esophagitis: Secondary | ICD-10-CM

## 2022-05-24 DIAGNOSIS — R1310 Dysphagia, unspecified: Secondary | ICD-10-CM | POA: Diagnosis not present

## 2022-05-24 DIAGNOSIS — R7989 Other specified abnormal findings of blood chemistry: Secondary | ICD-10-CM

## 2022-05-24 DIAGNOSIS — K224 Dyskinesia of esophagus: Secondary | ICD-10-CM | POA: Diagnosis not present

## 2022-05-24 DIAGNOSIS — K225 Diverticulum of esophagus, acquired: Secondary | ICD-10-CM | POA: Diagnosis not present

## 2022-05-26 ENCOUNTER — Other Ambulatory Visit: Payer: Self-pay

## 2022-05-26 DIAGNOSIS — I480 Paroxysmal atrial fibrillation: Secondary | ICD-10-CM | POA: Diagnosis not present

## 2022-05-26 DIAGNOSIS — K225 Diverticulum of esophagus, acquired: Secondary | ICD-10-CM

## 2022-05-26 DIAGNOSIS — I10 Essential (primary) hypertension: Secondary | ICD-10-CM | POA: Diagnosis not present

## 2022-05-26 DIAGNOSIS — Z9861 Coronary angioplasty status: Secondary | ICD-10-CM | POA: Diagnosis not present

## 2022-05-26 DIAGNOSIS — I2510 Atherosclerotic heart disease of native coronary artery without angina pectoris: Secondary | ICD-10-CM | POA: Diagnosis not present

## 2022-05-26 DIAGNOSIS — Z7901 Long term (current) use of anticoagulants: Secondary | ICD-10-CM | POA: Diagnosis not present

## 2022-05-31 ENCOUNTER — Telehealth: Payer: Self-pay | Admitting: Nurse Practitioner

## 2022-05-31 ENCOUNTER — Other Ambulatory Visit: Payer: Self-pay

## 2022-05-31 NOTE — Telephone Encounter (Signed)
PT was referred to an ENT and wants to have that information provided to her. Please advise.

## 2022-05-31 NOTE — Telephone Encounter (Signed)
Referral was sent to [facility]: Fax # [phone] Pt made aware.  Pt verbalized understanding with all questions answered.

## 2022-06-01 ENCOUNTER — Ambulatory Visit (INDEPENDENT_AMBULATORY_CARE_PROVIDER_SITE_OTHER): Payer: Medicare Other | Admitting: Family Medicine

## 2022-06-01 VITALS — BP 100/80 | HR 75 | Temp 97.6°F | Ht 62.0 in | Wt 127.6 lb

## 2022-06-01 DIAGNOSIS — E039 Hypothyroidism, unspecified: Secondary | ICD-10-CM | POA: Diagnosis not present

## 2022-06-01 DIAGNOSIS — K225 Diverticulum of esophagus, acquired: Secondary | ICD-10-CM

## 2022-06-01 DIAGNOSIS — R42 Dizziness and giddiness: Secondary | ICD-10-CM | POA: Diagnosis not present

## 2022-06-01 DIAGNOSIS — N289 Disorder of kidney and ureter, unspecified: Secondary | ICD-10-CM

## 2022-06-01 DIAGNOSIS — E119 Type 2 diabetes mellitus without complications: Secondary | ICD-10-CM | POA: Diagnosis not present

## 2022-06-01 DIAGNOSIS — R7989 Other specified abnormal findings of blood chemistry: Secondary | ICD-10-CM

## 2022-06-01 NOTE — Progress Notes (Signed)
Subjective  CC:  Chief Complaint  Patient presents with   Dysphagia   Fatigue    HPI: Brittney Cochran is a 82 y.o. female who presents to the office today to address the problems listed above in the chief complaint. 3 mo f/u: reviewed last note: malaise, fatigue, abdominal pain and dysphagia: Fortunately, she is feeling much better.  By problem: Malaise and fatigue, low energy in the setting of mildly elevated TSH started on levothyroxine 50 mcg daily: Feeling much better.  Energy levels are better.  Sleeping better.  Likely some fatigue was related to her COVID syndrome. Diabetes: A1c of 7.0.  He has a quite changed her diet significantly.  No symptoms of hyperglycemia Dysphagia: Has seen GI.  I reviewed notes and studies.  Barium swallow did show enlarging Zenker's diverticulum, no esophageal stricture was present, mild dysmotility.  She has been referred to ENT for further evaluation and possible surgical repair if needed.  She continues on a PPI.  Her abdominal pain has resolved Elevated liver function test: Checked about 2 weeks ago and much improved.  Likely due to COVID. Mild renal insufficiency had resolved, likely due to poor oral intake.  Her weight has stabilized. Hypertension and history of PAT: On Coreg 12.5 twice daily and lisinopril 2.5 mg daily.  Blood pressures have trended downward.  Over the last week she has noted lightheadedness upon standing.  She is scheduled to have an ablation in a few weeks. I reviewed recent cardiology notes.  BP Readings from Last 3 Encounters:  06/01/22 100/80  05/19/22 108/64  04/20/22 100/62   Wt Readings from Last 3 Encounters:  06/01/22 127 lb 9.6 oz (57.9 kg)  05/19/22 128 lb (58.1 kg)  04/20/22 140 lb (63.5 kg)    Assessment  1. Type 2 diabetes mellitus without complication, without long-term current use of insulin (HCC)   2. Elevated liver function tests   3. Hypothyroidism (acquired)   4. Acute renal insufficiency   5. Zenker  diverticulum   6. Lightheadedness      Plan  Diabetes: Start education about diet changes.  Recheck today.  On ACE inhibitor. Elevated liver function tests are improving.  Recheck today.  Likely related to recent COVID viral infection. Acute renal insufficiency resolved with hydration.  Ensure stable today. Recheck hypothyroidism on levothyroxine 50 mcg daily.  Feeling better. COVID long-hauler: Much better now. Zenker diverticulum: For evaluation by ENT. Lightheaded and low blood pressure: Decrease Coreg to 6.25 twice daily and monitor for worsening palpitations.  She has follow-up with cardiology in 2 weeks.  Follow up: As scheduled 09/26/2022  Orders Placed This Encounter  Procedures   CBC with Differential/Platelet   Comprehensive metabolic panel   Hemoglobin A1c   TSH   Microalbumin / creatinine urine ratio   No orders of the defined types were placed in this encounter.     I reviewed the patients updated PMH, FH, and SocHx.    Patient Active Problem List   Diagnosis Date Noted   Hemorrhagic cerebrovascular accident (CVA) ([place]) 04/20/2018    Priority: High   Major depression, recurrent, chronic ([place]) 02/12/2015    Priority: High   Coronary artery disease involving native coronary artery of native heart without angina pectoris 05/16/2012    Priority: High   Generalized anxiety disorder 01/10/2012    Priority: High   History of non-ST elevation myocardial infarction (NSTEMI) 12/05/2011    Priority: High   Mixed hyperlipidemia 12/31/2010    Priority: High  Paroxysmal atrial fibrillation (HCC) 12/14/2010    Priority: High   Essential hypertension 01/09/2009    Priority: High   Subclinical hypothyroidism 03/24/2017    Priority: Medium    DJD (degenerative joint disease), cervical 05/12/2016    Priority: Medium    Foraminal stenosis of cervical region 08/18/2015    Priority: Medium    Chronic pain of both knees 02/12/2015    Priority: Medium    Zenker  diverticulum 05/08/2014    Priority: Medium    Osteopenia 06/08/2012    Priority: Medium    Gastro-esophageal reflux disease without esophagitis 08/12/2011    Priority: Medium    Osteoarthritis, hand 12/31/2010    Priority: Low   Rosacea 12/31/2010    Priority: Low   Hypothyroidism (acquired) 04/20/2022   Fracture Risk Assessment Score (FRAX) indicating greater than 3% risk for hip fracture 10/18/2021   Current Meds  Medication Sig   ALPRAZolam (XANAX) 0.5 MG tablet Take 1 tablet (0.5 mg total) by mouth daily as needed.   aspirin EC 81 MG tablet Take 81 mg by mouth daily. Swallow whole.   carvedilol (COREG) 12.5 MG tablet Take 6.25 mg by mouth in the morning and at bedtime.   Co-Enzyme Q-10 30 MG CAPS Take by mouth.   ELIQUIS 5 MG TABS tablet Take 5 mg by mouth 2 (two) times daily.   famotidine (PEPCID) 20 MG tablet Take 1 tablet (20 mg total) by mouth at bedtime.   fluticasone (FLONASE) 50 MCG/ACT nasal spray USE 1 SPRAY NASALLY DAILY   levothyroxine (SYNTHROID) 50 MCG tablet Take 1 tablet (50 mcg total) by mouth daily.   lisinopril (PRINIVIL,ZESTRIL) 2.5 MG tablet Take 2.5 mg by mouth daily.    nitroGLYCERIN (NITROSTAT) 0.4 MG SL tablet Place 0.4 mg under the tongue.   omeprazole (PRILOSEC) 40 MG capsule TAKE 1 CAPSULE(40 MG) BY MOUTH DAILY   rosuvastatin (CRESTOR) 40 MG tablet Take 40 mg by mouth daily.    sertraline (ZOLOFT) 100 MG tablet Take 1.5 tablets (150 mg total) by mouth daily.   VITAMIN D, CHOLECALCIFEROL, PO Take by mouth.    Allergies: Patient is allergic to clindamycin. Family History: Patient family history includes Alcohol abuse in her father; Atrial fibrillation in her mother; Diabetes in her brother, father, and paternal grandmother; Early death in her father and maternal grandfather; Heart attack in her father, maternal uncle, and paternal grandmother; Kidney disease in her maternal grandmother; Stroke in her mother, paternal aunt, and son; Transient ischemic  attack in her mother. Social History:  Patient  reports that she has never smoked. She has never used smokeless tobacco. She reports current alcohol use of about 2.0 - 3.0 standard drinks of alcohol per week. She reports that she does not use drugs.  Review of Systems: Constitutional: Negative for fever malaise or anorexia Cardiovascular: negative for chest pain Respiratory: negative for SOB or persistent cough Gastrointestinal: negative for abdominal pain  Objective  Vitals: BP 100/80   Pulse 75   Temp 97.6 F (36.4 C)   Ht 5\' 2"  (1.575 m)   Wt 127 lb 9.6 oz (57.9 kg)   SpO2 97%   BMI 23.34 kg/m  General: no acute distress , A&Ox3, looks much better HEENT: PEERL, conjunctiva normal, neck is supple Cardiovascular:  RRR without murmur or gallop.  Respiratory:  Good breath sounds bilaterally, CTAB with normal respiratory effort Skin:  Warm, no rashes  Commons side effects, risks, benefits, and alternatives for medications and treatment plan prescribed today were  discussed, and the patient expressed understanding of the given instructions. Patient is instructed to call or message via MyChart if he/she has any questions or concerns regarding our treatment plan. No barriers to understanding were identified. We discussed Red Flag symptoms and signs in detail. Patient expressed understanding regarding what to do in case of urgent or emergency type symptoms.  Medication list was reconciled, printed and provided to the patient in AVS. Patient instructions and summary information was reviewed with the patient as documented in the AVS. This note was prepared with assistance of Dragon voice recognition software. Occasional wrong-word or sound-a-like substitutions may have occurred due to the inherent limitations of voice recognition software

## 2022-06-01 NOTE — Patient Instructions (Signed)
Please return in July to recheck blood pressure and diabetes.   Cut your carvedilol in half and take twice a day. This should stop the lightheadedness with standing. However, if you have more palpitations, may need to increase back to full dose until you have your ablation.   If you have any questions or concerns, please don't hesitate to send me a message via MyChart or call the office at [phone]. Thank you for visiting with [place] today! It's our pleasure caring for you.   Diabetes Mellitus and Nutrition, Adult When you have diabetes, or diabetes mellitus, it is very important to have healthy eating habits because your blood sugar (glucose) levels are greatly affected by what you eat and drink. Eating healthy foods in the right amounts, at about the same times every day, can help you: Manage your blood glucose. Lower your risk of heart disease. Improve your blood pressure. Reach or maintain a healthy weight. What can affect my meal plan? Every person with diabetes is different, and each person has different needs for a meal plan. Your health care provider may recommend that you work with a dietitian to make a meal plan that is best for you. Your meal plan may vary depending on factors such as: The calories you need. The medicines you take. Your weight. Your blood glucose, blood pressure, and cholesterol levels. Your activity level. Other health conditions you have, such as heart or kidney disease. How do carbohydrates affect me? Carbohydrates, also called carbs, affect your blood glucose level more than any other type of food. Eating carbs raises the amount of glucose in your blood. It is important to know how many carbs you can safely have in each meal. This is different for every person. Your dietitian can help you calculate how many carbs you should have at each meal and for each snack. How does alcohol affect me? Alcohol can cause a decrease in blood glucose (hypoglycemia), especially  if you use insulin or take certain diabetes medicines by mouth. Hypoglycemia can be a life-threatening condition. Symptoms of hypoglycemia, such as sleepiness, dizziness, and confusion, are similar to symptoms of having too much alcohol. Do not drink alcohol if: Your health care provider tells you not to drink. You are pregnant, may be pregnant, or are planning to become pregnant. If you drink alcohol: Limit how much you have to: 0-1 drink a day for women. 0-2 drinks a day for men. Know how much alcohol is in your drink. In the [place] one drink equals one 12 oz bottle of beer (355 mL), one 5 oz glass of wine (148 mL), or one 1 oz glass of hard liquor (44 mL). Keep yourself hydrated with water, diet soda, or unsweetened iced tea. Keep in mind that regular soda, juice, and other mixers may contain a lot of sugar and must be counted as carbs. What are tips for following this plan?  Reading food labels Start by checking the serving size on the Nutrition Facts label of packaged foods and drinks. The number of calories and the amount of carbs, fats, and other nutrients listed on the label are based on one serving of the item. Many items contain more than one serving per package. Check the total grams (g) of carbs in one serving. Check the number of grams of saturated fats and trans fats in one serving. Choose foods that have a low amount or none of these fats. Check the number of milligrams (mg) of salt (sodium) in one serving. Most  people should limit total sodium intake to less than 2,300 mg per day. Always check the nutrition information of foods labeled as "low-fat" or "nonfat." These foods may be higher in added sugar or refined carbs and should be avoided. Talk to your dietitian to identify your daily goals for nutrients listed on the label. Shopping Avoid buying canned, pre-made, or processed foods. These foods tend to be high in fat, sodium, and added sugar. Shop around the outside edge of the  grocery store. This is where you will most often find fresh fruits and vegetables, bulk grains, fresh meats, and fresh dairy products. Cooking Use low-heat cooking methods, such as baking, instead of high-heat cooking methods, such as deep frying. Cook using healthy oils, such as olive, canola, or sunflower oil. Avoid cooking with butter, cream, or high-fat meats. Meal planning Eat meals and snacks regularly, preferably at the same times every day. Avoid going long periods of time without eating. Eat foods that are high in fiber, such as fresh fruits, vegetables, beans, and whole grains. Eat 4-6 oz (112-168 g) of lean protein each day, such as lean meat, chicken, fish, eggs, or tofu. One ounce (oz) (28 g) of lean protein is equal to: 1 oz (28 g) of meat, chicken, or fish. 1 egg.  cup (62 g) of tofu. Eat some foods each day that contain healthy fats, such as avocado, nuts, seeds, and fish. What foods should I eat? Fruits Berries. Apples. Oranges. Peaches. Apricots. Plums. Grapes. Mangoes. Papayas. Pomegranates. Kiwi. Cherries. Vegetables Leafy greens, including lettuce, spinach, kale, chard, collard greens, mustard greens, and cabbage. Beets. Cauliflower. Broccoli. Carrots. Green beans. Tomatoes. Peppers. Onions. Cucumbers. Brussels sprouts. Grains Whole grains, such as whole-wheat or whole-grain bread, crackers, tortillas, cereal, and pasta. Unsweetened oatmeal. Quinoa. Brown or wild rice. Meats and other proteins Seafood. Poultry without skin. Lean cuts of poultry and beef. Tofu. Nuts. Seeds. Dairy Low-fat or fat-free dairy products such as milk, yogurt, and cheese. The items listed above may not be a complete list of foods and beverages you can eat and drink. Contact a dietitian for more information. What foods should I avoid? Fruits Fruits canned with syrup. Vegetables Canned vegetables. Frozen vegetables with butter or cream sauce. Grains Refined white flour and flour products  such as bread, pasta, snack foods, and cereals. Avoid all processed foods. Meats and other proteins Fatty cuts of meat. Poultry with skin. Breaded or fried meats. Processed meat. Avoid saturated fats. Dairy Full-fat yogurt, cheese, or milk. Beverages Sweetened drinks, such as soda or iced tea. The items listed above may not be a complete list of foods and beverages you should avoid. Contact a dietitian for more information. Questions to ask a health care provider Do I need to meet with a certified diabetes care and education specialist? Do I need to meet with a dietitian? What number can I call if I have questions? When are the best times to check my blood glucose? Where to find more information: American Diabetes Association: diabetes.org Academy of Nutrition and Dietetics: eatright.[employer] of Diabetes and Digestive and Kidney Diseases: [employer].is Association of Diabetes Care & Education Specialists: diabeteseducator.org Summary It is important to have healthy eating habits because your blood sugar (glucose) levels are greatly affected by what you eat and drink. It is important to use alcohol carefully. A healthy meal plan will help you manage your blood glucose and lower your risk of heart disease. Your health care provider may recommend that you work with a dietitian  to make a meal plan that is best for you. This information is not intended to replace advice given to you by your health care provider. Make sure you discuss any questions you have with your health care provider. Document Revised: 10/02/2019 Document Reviewed: 10/02/2019 Elsevier Patient Education  [place].

## 2022-06-02 LAB — CBC WITH DIFFERENTIAL/PLATELET
Basophils Absolute: 0.1 10*3/uL (ref 0.0–0.1)
Basophils Relative: 0.9 % (ref 0.0–3.0)
Eosinophils Absolute: 0.1 10*3/uL (ref 0.0–0.7)
Eosinophils Relative: 2.1 % (ref 0.0–5.0)
HCT: 40.1 % (ref 36.0–46.0)
Hemoglobin: 12.8 g/dL (ref 12.0–15.0)
Lymphocytes Relative: 21.7 % (ref 12.0–46.0)
Lymphs Abs: 1.4 10*3/uL (ref 0.7–4.0)
MCHC: 31.8 g/dL (ref 30.0–36.0)
MCV: 84.6 fl (ref 78.0–100.0)
Monocytes Absolute: 0.7 10*3/uL (ref 0.1–1.0)
Monocytes Relative: 10.7 % (ref 3.0–12.0)
Neutro Abs: 4.3 10*3/uL (ref 1.4–7.7)
Neutrophils Relative %: 64.6 % (ref 43.0–77.0)
Platelets: 190 10*3/uL (ref 150.0–400.0)
RBC: 4.74 Mil/uL (ref 3.87–5.11)
RDW: 16.4 % — ABNORMAL HIGH (ref 11.5–15.5)
WBC: 6.6 10*3/uL (ref 4.0–10.5)

## 2022-06-02 LAB — MICROALBUMIN / CREATININE URINE RATIO
Creatinine,U: 150.8 mg/dL
Microalb Creat Ratio: 1.6 mg/g (ref 0.0–30.0)
Microalb, Ur: 2.4 mg/dL — ABNORMAL HIGH (ref 0.0–1.9)

## 2022-06-02 LAB — COMPREHENSIVE METABOLIC PANEL
ALT: 28 U/L (ref 0–35)
AST: 49 U/L — ABNORMAL HIGH (ref 0–37)
Albumin: 4.1 g/dL (ref 3.5–5.2)
Alkaline Phosphatase: 88 U/L (ref 39–117)
BUN: 23 mg/dL (ref 6–23)
CO2: 27 mEq/L (ref 19–32)
Calcium: 9.3 mg/dL (ref 8.4–10.5)
Chloride: 103 mEq/L (ref 96–112)
Creatinine, Ser: 1.47 mg/dL — ABNORMAL HIGH (ref 0.40–1.20)
GFR: 32.92 mL/min — ABNORMAL LOW (ref >60.00)
Glucose, Bld: 103 mg/dL — ABNORMAL HIGH (ref 70–99)
Potassium: 4.3 mEq/L (ref 3.5–5.1)
Sodium: 139 mEq/L (ref 135–145)
Total Bilirubin: 0.6 mg/dL (ref 0.2–1.2)
Total Protein: 6.5 g/dL (ref 6.0–8.3)

## 2022-06-02 LAB — HEMOGLOBIN A1C: Hgb A1c MFr Bld: 7.3 % — ABNORMAL HIGH (ref 4.6–6.5)

## 2022-06-03 LAB — TSH: TSH: 2.04 u[IU]/mL (ref 0.35–5.50)

## 2022-06-06 ENCOUNTER — Telehealth: Payer: Self-pay | Admitting: Family Medicine

## 2022-06-06 NOTE — Telephone Encounter (Signed)
Patient requests to be called to be given lab results.  Patient states she does not want messages/results sent to MyChart.  Also requests print out of Lab results from 04/20/22 and 06/01/22.

## 2022-06-07 ENCOUNTER — Telehealth: Payer: Self-pay

## 2022-06-07 NOTE — Telephone Encounter (Addendum)
Contacted pt and pt was aware of 2 day Eliquis hold per Dr.Fitzgerald. Pt also stated that her procedure was cancelled & she was concerned about ENT referral. Call was tranferred to [facility] who placed the referral for ENT.

## 2022-06-07 NOTE — Telephone Encounter (Deleted)
Contacted pt and pt was aware of 2 day Eliwuis hold per Dr.Fitzgerald. Pt also stated that her procedure was cancelled & she was concerned about ENT referral. Call was tranferred to [facility] who placed the referral for ENT.

## 2022-06-09 DIAGNOSIS — D239 Other benign neoplasm of skin, unspecified: Secondary | ICD-10-CM | POA: Diagnosis not present

## 2022-06-09 DIAGNOSIS — L853 Xerosis cutis: Secondary | ICD-10-CM | POA: Diagnosis not present

## 2022-06-09 DIAGNOSIS — L718 Other rosacea: Secondary | ICD-10-CM | POA: Diagnosis not present

## 2022-06-29 ENCOUNTER — Telehealth: Payer: Self-pay | Admitting: Family Medicine

## 2022-06-29 NOTE — Telephone Encounter (Signed)
Patient is requesting copy of lab results from 06/01/22 and 04/13/22 so she can discuss them with cardiology on 4/22. Pt requests call when ready for pick up.

## 2022-06-29 NOTE — Telephone Encounter (Signed)
Lab results printed and placed up front at check in for pt to pick up. Pt has been notified.

## 2022-06-30 ENCOUNTER — Encounter: Payer: Medicare Other | Admitting: Gastroenterology

## 2022-07-04 DIAGNOSIS — K225 Diverticulum of esophagus, acquired: Secondary | ICD-10-CM | POA: Diagnosis not present

## 2022-07-04 DIAGNOSIS — I2510 Atherosclerotic heart disease of native coronary artery without angina pectoris: Secondary | ICD-10-CM | POA: Diagnosis not present

## 2022-07-04 DIAGNOSIS — Z7901 Long term (current) use of anticoagulants: Secondary | ICD-10-CM | POA: Diagnosis not present

## 2022-07-04 DIAGNOSIS — I619 Nontraumatic intracerebral hemorrhage, unspecified: Secondary | ICD-10-CM | POA: Diagnosis not present

## 2022-07-04 DIAGNOSIS — Z9861 Coronary angioplasty status: Secondary | ICD-10-CM | POA: Diagnosis not present

## 2022-07-04 DIAGNOSIS — E038 Other specified hypothyroidism: Secondary | ICD-10-CM | POA: Diagnosis not present

## 2022-07-04 DIAGNOSIS — I4891 Unspecified atrial fibrillation: Secondary | ICD-10-CM | POA: Diagnosis not present

## 2022-07-05 DIAGNOSIS — Z7902 Long term (current) use of antithrombotics/antiplatelets: Secondary | ICD-10-CM | POA: Diagnosis not present

## 2022-07-05 DIAGNOSIS — K225 Diverticulum of esophagus, acquired: Secondary | ICD-10-CM | POA: Diagnosis not present

## 2022-07-05 DIAGNOSIS — R1314 Dysphagia, pharyngoesophageal phase: Secondary | ICD-10-CM | POA: Diagnosis not present

## 2022-07-05 DIAGNOSIS — R634 Abnormal weight loss: Secondary | ICD-10-CM | POA: Diagnosis not present

## 2022-07-14 DIAGNOSIS — E038 Other specified hypothyroidism: Secondary | ICD-10-CM | POA: Diagnosis not present

## 2022-07-14 DIAGNOSIS — K648 Other hemorrhoids: Secondary | ICD-10-CM | POA: Diagnosis not present

## 2022-07-14 DIAGNOSIS — R634 Abnormal weight loss: Secondary | ICD-10-CM | POA: Diagnosis not present

## 2022-07-14 DIAGNOSIS — Z7901 Long term (current) use of anticoagulants: Secondary | ICD-10-CM | POA: Diagnosis not present

## 2022-07-14 DIAGNOSIS — I2510 Atherosclerotic heart disease of native coronary artery without angina pectoris: Secondary | ICD-10-CM | POA: Diagnosis not present

## 2022-07-14 DIAGNOSIS — K5730 Diverticulosis of large intestine without perforation or abscess without bleeding: Secondary | ICD-10-CM | POA: Diagnosis not present

## 2022-07-14 DIAGNOSIS — Z7982 Long term (current) use of aspirin: Secondary | ICD-10-CM | POA: Diagnosis not present

## 2022-07-14 DIAGNOSIS — R1314 Dysphagia, pharyngoesophageal phase: Secondary | ICD-10-CM | POA: Diagnosis not present

## 2022-07-14 DIAGNOSIS — Z79899 Other long term (current) drug therapy: Secondary | ICD-10-CM | POA: Diagnosis not present

## 2022-07-14 DIAGNOSIS — I10 Essential (primary) hypertension: Secondary | ICD-10-CM | POA: Diagnosis not present

## 2022-07-14 DIAGNOSIS — D122 Benign neoplasm of ascending colon: Secondary | ICD-10-CM | POA: Diagnosis not present

## 2022-07-14 DIAGNOSIS — M1710 Unilateral primary osteoarthritis, unspecified knee: Secondary | ICD-10-CM | POA: Diagnosis not present

## 2022-07-14 DIAGNOSIS — Z955 Presence of coronary angioplasty implant and graft: Secondary | ICD-10-CM | POA: Diagnosis not present

## 2022-07-14 DIAGNOSIS — K225 Diverticulum of esophagus, acquired: Secondary | ICD-10-CM | POA: Diagnosis not present

## 2022-07-14 DIAGNOSIS — K219 Gastro-esophageal reflux disease without esophagitis: Secondary | ICD-10-CM | POA: Diagnosis not present

## 2022-09-06 ENCOUNTER — Other Ambulatory Visit: Payer: Self-pay | Admitting: Nurse Practitioner

## 2022-09-06 NOTE — Telephone Encounter (Signed)
Please advise 

## 2022-09-19 ENCOUNTER — Other Ambulatory Visit: Payer: Self-pay | Admitting: Family Medicine

## 2022-09-26 ENCOUNTER — Ambulatory Visit: Payer: Medicare Other | Admitting: Family Medicine

## 2022-10-10 ENCOUNTER — Ambulatory Visit: Payer: Medicare Other | Admitting: Family Medicine

## 2022-10-12 ENCOUNTER — Other Ambulatory Visit: Payer: Self-pay | Admitting: Family Medicine

## 2022-10-12 DIAGNOSIS — Z1231 Encounter for screening mammogram for malignant neoplasm of breast: Secondary | ICD-10-CM

## 2022-10-17 ENCOUNTER — Ambulatory Visit: Payer: Medicare Other | Admitting: Family Medicine

## 2022-10-25 ENCOUNTER — Ambulatory Visit: Payer: Medicare Other

## 2022-10-27 ENCOUNTER — Encounter (INDEPENDENT_AMBULATORY_CARE_PROVIDER_SITE_OTHER): Payer: Self-pay

## 2022-10-28 ENCOUNTER — Ambulatory Visit: Payer: Medicare Other | Admitting: Family Medicine

## 2022-11-29 ENCOUNTER — Ambulatory Visit: Payer: Medicare Other | Admitting: Family Medicine

## 2022-11-30 ENCOUNTER — Ambulatory Visit: Payer: Medicare Other

## 2022-12-16 ENCOUNTER — Ambulatory Visit: Payer: Medicare Other

## 2022-12-22 ENCOUNTER — Ambulatory Visit: Payer: Medicare Other

## 2022-12-22 ENCOUNTER — Ambulatory Visit: Payer: Medicare Other | Admitting: Family Medicine

## 2022-12-22 ENCOUNTER — Encounter: Payer: Self-pay | Admitting: Family Medicine

## 2022-12-22 VITALS — BP 128/70 | HR 64 | Temp 97.8°F | Ht 62.0 in | Wt 127.6 lb

## 2022-12-22 VITALS — BP 128/70 | HR 60 | Temp 98.5°F | Wt 127.9 lb

## 2022-12-22 DIAGNOSIS — I480 Paroxysmal atrial fibrillation: Secondary | ICD-10-CM | POA: Diagnosis not present

## 2022-12-22 DIAGNOSIS — E119 Type 2 diabetes mellitus without complications: Secondary | ICD-10-CM | POA: Diagnosis not present

## 2022-12-22 DIAGNOSIS — Z7901 Long term (current) use of anticoagulants: Secondary | ICD-10-CM

## 2022-12-22 DIAGNOSIS — E039 Hypothyroidism, unspecified: Secondary | ICD-10-CM | POA: Diagnosis not present

## 2022-12-22 DIAGNOSIS — Z0000 Encounter for general adult medical examination without abnormal findings: Secondary | ICD-10-CM | POA: Diagnosis not present

## 2022-12-22 DIAGNOSIS — Z23 Encounter for immunization: Secondary | ICD-10-CM

## 2022-12-22 DIAGNOSIS — I10 Essential (primary) hypertension: Secondary | ICD-10-CM

## 2022-12-22 DIAGNOSIS — R809 Proteinuria, unspecified: Secondary | ICD-10-CM | POA: Insufficient documentation

## 2022-12-22 LAB — COMPREHENSIVE METABOLIC PANEL
ALT: 115 U/L — ABNORMAL HIGH (ref 0–35)
AST: 170 U/L — ABNORMAL HIGH (ref 0–37)
Albumin: 4.1 g/dL (ref 3.5–5.2)
Alkaline Phosphatase: 99 U/L (ref 39–117)
BUN: 22 mg/dL (ref 6–23)
CO2: 32 meq/L (ref 19–32)
Calcium: 9.5 mg/dL (ref 8.4–10.5)
Chloride: 102 meq/L (ref 96–112)
Creatinine, Ser: 1.06 mg/dL (ref 0.40–1.20)
GFR: 48.55 mL/min — ABNORMAL LOW (ref >60.00)
Glucose, Bld: 99 mg/dL (ref 70–99)
Potassium: 4.1 meq/L (ref 3.5–5.1)
Sodium: 138 meq/L (ref 135–145)
Total Bilirubin: 0.5 mg/dL (ref 0.2–1.2)
Total Protein: 6.2 g/dL (ref 6.0–8.3)

## 2022-12-22 LAB — CBC WITH DIFFERENTIAL/PLATELET
Basophils Absolute: 0 10*3/uL (ref 0.0–0.1)
Basophils Relative: 0.6 % (ref 0.0–3.0)
Eosinophils Absolute: 0 10*3/uL (ref 0.0–0.7)
Eosinophils Relative: 0.8 % (ref 0.0–5.0)
HCT: 40.1 % (ref 36.0–46.0)
Hemoglobin: 12.5 g/dL (ref 12.0–15.0)
Lymphocytes Relative: 14.4 % (ref 12.0–46.0)
Lymphs Abs: 0.8 10*3/uL (ref 0.7–4.0)
MCHC: 31.2 g/dL (ref 30.0–36.0)
MCV: 86.7 fL (ref 78.0–100.0)
Monocytes Absolute: 0.9 10*3/uL (ref 0.1–1.0)
Monocytes Relative: 16.1 % — ABNORMAL HIGH (ref 3.0–12.0)
Neutro Abs: 4 10*3/uL (ref 1.4–7.7)
Neutrophils Relative %: 68.1 % (ref 43.0–77.0)
Platelets: 134 10*3/uL — ABNORMAL LOW (ref 150.0–400.0)
RBC: 4.63 Mil/uL (ref 3.87–5.11)
RDW: 16.7 % — ABNORMAL HIGH (ref 11.5–15.5)
WBC: 5.8 10*3/uL (ref 4.0–10.5)

## 2022-12-22 LAB — POCT GLYCOSYLATED HEMOGLOBIN (HGB A1C): Hemoglobin A1C: 6.3 % — AB (ref 4.0–5.6)

## 2022-12-22 LAB — TSH: TSH: 6.34 u[IU]/mL — ABNORMAL HIGH (ref 0.35–5.50)

## 2022-12-22 NOTE — Progress Notes (Addendum)
Subjective:   Brittney Cochran is a 82 y.o. female who presents for Medicare Annual (Subsequent) preventive examination.  Visit Complete: In person  Cardiac Risk Factors include: advanced age (>96men, >37 women);dyslipidemia;hypertension;diabetes mellitus     Objective:    Today's Vitals   12/22/22 1132  BP: 128/70  Pulse: 60  Temp: 98.5 F (36.9 C)  SpO2: 99%  Weight: 127 lb 14.4 oz (58 kg)   Body mass index is 23.39 kg/m.     10/18/2021    2:07 PM 04/12/2021    2:06 PM 03/12/2018    3:41 PM  Advanced Directives  Does Patient Have a Medical Advance Directive? No No No  Would patient like information on creating a medical advance directive? Yes (MAU/Ambulatory/Procedural Areas - Information given) No - Patient declined Yes (MAU/Ambulatory/Procedural Areas - Information given)    Current Medications (verified) Outpatient Encounter Medications as of 12/22/2022  Medication Sig   ALPRAZolam (XANAX) 0.5 MG tablet Take 1 tablet (0.5 mg total) by mouth daily as needed.   amiodarone (PACERONE) 200 MG tablet Take 200 mg by mouth daily.   aspirin EC 81 MG tablet Take 81 mg by mouth daily. Swallow whole.   carvedilol (COREG) 12.5 MG tablet Take 6.25 mg by mouth in the morning and at bedtime.   Co-Enzyme Q-10 30 MG CAPS Take by mouth.   ELIQUIS 5 MG TABS tablet Take 5 mg by mouth 2 (two) times daily.   famotidine (PEPCID) 20 MG tablet TAKE 1 TABLET(20 MG) BY MOUTH AT BEDTIME   fluticasone (FLONASE) 50 MCG/ACT nasal spray USE 1 SPRAY NASALLY DAILY   levothyroxine (SYNTHROID) 50 MCG tablet Take 1 tablet (50 mcg total) by mouth daily.   lisinopril (PRINIVIL,ZESTRIL) 2.5 MG tablet Take 2.5 mg by mouth daily.    nitroGLYCERIN (NITROSTAT) 0.4 MG SL tablet Place 0.4 mg under the tongue.   omeprazole (PRILOSEC) 40 MG capsule TAKE 1 CAPSULE(40 MG) BY MOUTH DAILY   rosuvastatin (CRESTOR) 40 MG tablet Take 40 mg by mouth daily.    sertraline (ZOLOFT) 100 MG tablet Take 1.5 tablets (150 mg  total) by mouth daily.   VITAMIN D, CHOLECALCIFEROL, PO Take by mouth.   No facility-administered encounter medications on file as of 12/22/2022.    Allergies (verified) Clindamycin   History: Past Medical History:  Diagnosis Date   Atrial fibrillation (HCC)    Coronary artery disease    GERD (gastroesophageal reflux disease)    endoscopy and esophageal diatation 2013 Dr. Eulah Pont   Hyperlipemia    Leiomyoma of body of uterus    Long term current use of anticoagulant therapy 07/07/2015   Stopped 2020; no recurrence of PAF since ablation. Stopped due to hemorrhagic cva.    Osteopenia    Stroke [facility])    Subclinical hypothyroidism 03/24/2017   Started synthroid 25 03/2017 for increasing TSH.   Past Surgical History:  Procedure Laterality Date   ABLATION OF DYSRHYTHMIC FOCUS     BLADDER REPAIR  2011   BREAST BIOPSY Left 08/19/2013   CAROTID STENT  12/04/2011   ESOPHAGEAL DILATION  2013   Floating Kidney  1963   TUBAL LIGATION  1985   Family History  Problem Relation Age of Onset   Atrial fibrillation Mother    Transient ischemic attack Mother    Stroke Mother    Alcohol abuse Father    Diabetes Father    Early death Father    Heart attack Father    Diabetes Brother  Kidney disease Maternal Grandmother    Early death Maternal Grandfather    Diabetes Paternal Grandmother    Heart attack Paternal Grandmother    Stroke Son    Heart attack Maternal Uncle    Stroke Paternal Aunt    Colon cancer Neg Hx    Stomach cancer Neg Hx    Esophageal cancer Neg Hx    Pancreatic cancer Neg Hx    Social History   Socioeconomic History   Marital status: Married    Spouse name: Not on file   Number of children: Not on file   Years of education: Not on file   Highest education level: Not on file  Occupational History   Not on file  Tobacco Use   Smoking status: Never   Smokeless tobacco: Never  Vaping Use   Vaping status: Never Used  Substance and Sexual Activity   Alcohol  use: Yes    Alcohol/week: 2.0 - 3.0 standard drinks of alcohol    Types: 2 - 3 Glasses of wine per week    Comment: Red Wine   Drug use: No   Sexual activity: Never  Other Topics Concern   Not on file  Social History Narrative   Not on file   Social Determinants of Health   Financial Resource Strain: Low Risk  (12/22/2022)   Overall Financial Resource Strain (CARDIA)    Difficulty of Paying Living Expenses: Not hard at all  Food Insecurity: No Food Insecurity (12/22/2022)   Hunger Vital Sign    Worried About Running Out of Food in the Last Year: Never true    Ran Out of Food in the Last Year: Never true  Transportation Needs: No Transportation Needs (12/22/2022)   PRAPARE - Administrator, Civil Service (Medical): No    Lack of Transportation (Non-Medical): No  Physical Activity: Inactive (12/22/2022)   Exercise Vital Sign    Days of Exercise per Week: 0 days    Minutes of Exercise per Session: 0 min  Stress: No Stress Concern Present (12/22/2022)   [employer] of Occupational Health - Occupational Stress Questionnaire    Feeling of Stress : Not at all  Social Connections: Moderately Isolated (12/22/2022)   Social Connection and Isolation Panel [NHANES]    Frequency of Communication with Friends and Family: More than three times a week    Frequency of Social Gatherings with Friends and Family: More than three times a week    Attends Religious Services: Never    Database administrator or Organizations: No    Attends Engineer, structural: Never    Marital Status: Married    Tobacco Counseling Counseling given: Not Answered   Clinical Intake:  Pre-visit preparation completed: Yes  Pain : No/denies pain     BMI - recorded: 23.39 Nutritional Status: BMI of 19-24  Normal Nutritional Risks: None Diabetes: Yes CBG done?: No Did pt. bring in CBG monitor from home?: No     Interpreter Needed?: No  Information entered by :: Lanier Ensign, LPN   Activities of Daily Living    12/22/2022   11:34 AM  In your present state of health, do you have any difficulty performing the following activities:  Hearing? 1  Comment HOH  Vision? 0  Difficulty concentrating or making decisions? 0  Walking or climbing stairs? 0  Comment avoid  Dressing or bathing? 0  Doing errands, shopping? 0  Preparing Food and eating ? N  Using the Toilet?  N  In the past six months, have you accidently leaked urine? N  Do you have problems with loss of bowel control? N  Managing your Medications? N  Managing your Finances? N  Housekeeping or managing your Housekeeping? N    Patient Care Team: Willow Ora, MD as PCP - General (Family Medicine) Donalee Citrin, MD as Consulting Physician (Neurosurgery) Lanell Matar., DO as Referring Physician (Cardiology) Gayla Doss, MD as Referring Physician (Cardiology)  Indicate any recent Medical Services you may have received from other than [facility] providers in the past year (date may be approximate).     Assessment:   This is a routine wellness examination for [place].  Hearing/Vision screen Hearing Screening - Comments:: Pt stated HOH  Vision Screening - Comments:: Pt follows up with Dr Sondra Barges for annual eye exams    Goals Addressed             This Visit's Progress    Patient Stated       Continue to eat right        Depression Screen    12/22/2022   11:37 AM 12/22/2022   10:37 AM 06/01/2022    2:34 PM 04/13/2022   10:15 AM 10/18/2021    2:36 PM 10/18/2021    2:03 PM 04/12/2021    2:06 PM  PHQ 2/9 Scores  PHQ - 2 Score 0 0 0 0 0 0 0  PHQ- 9 Score       0    Fall Risk    12/22/2022   11:38 AM 12/22/2022   10:37 AM 06/01/2022    2:34 PM 04/20/2022    2:07 PM 04/13/2022   10:15 AM  Fall Risk   Falls in the past year? 0 0 0 0 1  Number falls in past yr: 0 0 0 0 1  Injury with Fall? 0 0 0 0 0  Risk for fall due to : Impaired vision No Fall Risks No Fall Risks No  Fall Risks History of fall(s)  Follow up Falls prevention discussed Falls evaluation completed Falls evaluation completed Falls evaluation completed Falls evaluation completed    MEDICARE RISK AT HOME: Medicare Risk at Home Any stairs in or around the home?: Yes If so, are there any without handrails?: No Home free of loose throw rugs in walkways, pet beds, electrical cords, etc?: Yes Adequate lighting in your home to reduce risk of falls?: Yes Life alert?: No Use of a cane, walker or w/c?: Yes Grab bars in the bathroom?: No Shower chair or bench in shower?: No Elevated toilet seat or a handicapped toilet?: No  TIMED UP AND GO:  Was the test performed?  Yes  Length of time to ambulate 10 feet: 10 sec Gait slow and steady without use of assistive device    Cognitive Function:        12/22/2022   11:39 AM 10/18/2021    2:13 PM  6CIT Screen  What Year? 0 points 0 points  What month? 0 points 0 points  What time? 0 points 0 points  Count back from 20 0 points 0 points  Months in reverse 0 points 0 points  Repeat phrase 0 points 0 points  Total Score 0 points 0 points    Immunizations Immunization History  Administered Date(s) Administered   Fluad Quad(high Dose 65+) 12/16/2016, 11/29/2018, 01/08/2020, 01/05/2021   Fluad Trivalent(High Dose 65+) 12/22/2022   Influenza Split 01/11/2007, 02/04/2008, 01/01/2009, 04/19/2010, 12/14/2010, 01/12/2011   Influenza, High  Dose Seasonal PF 11/29/2011, 12/18/2012, 12/10/2013, 02/02/2015, 12/16/2016, 12/01/2017, 01/08/2020, 01/05/2021   Influenza, Seasonal, Injecte, Preservative Fre 03/14/2009, 12/08/2015   Influenza,inj,quad, With Preservative 03/14/2009   Influenza-Unspecified 01/11/2007, 02/04/2008, 01/01/2009, 03/14/2009, 04/19/2010, 12/14/2010, 01/12/2011, 12/08/2015   Moderna Sars-Covid-2 Vaccination 04/12/2019, 05/08/2019   Pneumococcal Conjugate-13 04/08/2014   Pneumococcal Polysaccharide-23 01/01/2009, 03/14/2009    Pneumococcal-Unspecified 03/14/2009   Tdap 03/14/2004, 03/14/2006      Flu Vaccine status: Up to date  Pneumococcal vaccine status: Up to date  Covid-19 vaccine status: Information provided on how to obtain vaccines.   Qualifies for Shingles Vaccine? Yes   Zostavax completed No   Shingrix Completed?: No.    Education has been provided regarding the importance of this vaccine. Patient has been advised to call insurance company to determine out of pocket expense if they have not yet received this vaccine. Advised may also receive vaccine at local pharmacy or Health Dept. Verbalized acceptance and understanding.  Screening Tests Health Maintenance  Topic Date Due   FOOT EXAM  Never done   OPHTHALMOLOGY EXAM  Never done   MAMMOGRAM  09/18/2022   COVID-19 Vaccine (3 - Moderna risk series) 01/07/2023 (Originally 06/05/2019)   Diabetic kidney evaluation - eGFR measurement  06/01/2023   Diabetic kidney evaluation - Urine ACR  06/01/2023   HEMOGLOBIN A1C  06/22/2023   DEXA SCAN  09/18/2023   Medicare Annual Wellness (AWV)  12/22/2023   Pneumonia Vaccine 64+ Years old  Completed   INFLUENZA VACCINE  Completed   HPV VACCINES  Aged Out   DTaP/Tdap/Td  Discontinued   Zoster Vaccines- Shingrix  Discontinued    Health Maintenance  Health Maintenance Due  Topic Date Due   FOOT EXAM  Never done   OPHTHALMOLOGY EXAM  Never done   MAMMOGRAM  09/18/2022    Colorectal cancer screening: No longer required.   Mammogram status: Completed 10/12/22. Repeat every year  Bone Density status: Completed 09/17/21. Results reflect: Bone density results: OSTEOPENIA. Repeat every 2 years.  Additional Screening:    Vision Screening: Recommended annual ophthalmology exams for early detection of glaucoma and other disorders of the eye. Is the patient up to date with their annual eye exam?  No  Who is the provider or what is the name of the office in which the patient attends annual eye exams? Dr Sondra Barges   If pt is not established with a provider, would they like to be referred to a provider to establish care? No .   Dental Screening: Recommended annual dental exams for proper oral hygiene  Diabetic Foot Exam: Diabetic Foot Exam: Overdue, Pt has been advised about the importance in completing this exam. Pt is scheduled for diabetic foot exam on next appt .  Community Resource Referral / Chronic Care Management: CRR required this visit?  No   CCM required this visit?  No     Plan:     I have personally reviewed and noted the following in the patient's chart:   Medical and social history Use of alcohol, tobacco or illicit drugs  Current medications and supplements including opioid prescriptions. Patient is not currently taking opioid prescriptions. Functional ability and status Nutritional status Physical activity Advanced directives List of other physicians Hospitalizations, surgeries, and ER visits in previous 12 months Vitals Screenings to include cognitive, depression, and falls Referrals and appointments  In addition, I have reviewed and discussed with patient certain preventive protocols, quality metrics, and best practice recommendations. A written personalized care plan for preventive services as well  as general preventive health recommendations were provided to patient.     Marzella Schlein, LPN   [id number]/12/[id number]   After Visit Summary: (MyChart) Due to this being a telephonic visit, the after visit summary with patients personalized plan was offered to patient via MyChart   Nurse Notes: none

## 2022-12-22 NOTE — Patient Instructions (Signed)
Brittney Cochran , Thank you for taking time to come for your Medicare Wellness Visit. I appreciate your ongoing commitment to your health goals. Please review the following plan we discussed and let me know if I can assist you in the future.   Referrals/Orders/Follow-Ups/Clinician Recommendations: Aim for 30 minutes of exercise or brisk walking, 6-8 glasses of water, and 5 servings of fruits and vegetables each day. Each day, aim for 6 glasses of water, plenty of protein in your diet and try to get up and walk/ stretch every hour for 5-10 minutes at a time.    This is a list of the screening recommended for you and due dates:  Health Maintenance  Topic Date Due   Complete foot exam   Never done   Eye exam for diabetics  Never done   Mammogram  09/18/2022   Medicare Annual Wellness Visit  10/19/2022   COVID-19 Vaccine (3 - Moderna risk series) 01/07/2023*   Yearly kidney function blood test for diabetes  06/01/2023   Yearly kidney health urinalysis for diabetes  06/01/2023   Hemoglobin A1C  06/22/2023   DEXA scan (bone density measurement)  09/18/2023   Pneumonia Vaccine  Completed   Flu Shot  Completed   HPV Vaccine  Aged Out   DTaP/Tdap/Td vaccine  Discontinued   Zoster (Shingles) Vaccine  Discontinued  *Topic was postponed. The date shown is not the original due date.    Advanced directives: (Declined) Advance directive discussed with you today. Even though you declined this today, please call our office should you change your mind, and we can give you the proper paperwork for you to fill out.  Next Medicare Annual Wellness Visit scheduled for next year: Yes

## 2022-12-22 NOTE — Progress Notes (Signed)
Subjective  CC:  Chief Complaint  Patient presents with   Hypertension    Mammogram and eye exam has not been done   Diabetes    HPI: Brittney Cochran is a 82 y.o. female who presents to the office today for follow up of diabetes and problems listed above in the chief complaint.  Diabetes follow up: Her diabetic control is reported as Improved. Eating better except for pineapple shakes from cook out.  She denies exertional CP or SOB or symptomatic hypoglycemia. She denies foot sores or paresthesias. Diet controlled. Zenker's: worsening swallowing. To see ent tomorrow. HTN: doing very well now since we cut back on coreg dose. On low dose ace Low thyroid: feels like all is well. No concerns with weight, hot flushes, palpitations, sweats or sluggishness PAF: to see cards; stopped her eloquis due to bruising. On amiodarone; planning for ablation.   Wt Readings from Last 3 Encounters:  12/22/22 127 lb 14.4 oz (58 kg)  12/22/22 127 lb 9.6 oz (57.9 kg)  06/01/22 127 lb 9.6 oz (57.9 kg)    BP Readings from Last 3 Encounters:  12/22/22 128/70  12/22/22 128/70  06/01/22 100/80    Assessment  On 1. Type 2 diabetes mellitus without complication, without long-term current use of insulin (HCC)   2. Need for influenza vaccination   3. Essential hypertension   4. Hypothyroidism (acquired)   5. Paroxysmal atrial fibrillation (HCC)   6. Current use of long term anticoagulation      Plan  Diabetes is currently very well controlled. Continue dietary mgt Flu shot today HTN is controlled Recheck thyroid PAF: should be on eloquis. Will discuss with cards. They will clarify need prior to ablation. On amiodarone: check tsh  Follow up: 6 mo for cpe Orders Placed This Encounter  Procedures   Flu Vaccine Trivalent High Dose (Fluad)   CBC with Differential/Platelet   Comprehensive metabolic panel   TSH   POCT HgB A1C   No orders of the defined types were placed in this encounter.      Immunization History  Administered Date(s) Administered   Fluad Quad(high Dose 65+) 12/16/2016, 11/29/2018, 01/08/2020, 01/05/2021   Fluad Trivalent(High Dose 65+) 12/22/2022   Influenza Split 01/11/2007, 02/04/2008, 01/01/2009, 04/19/2010, 12/14/2010, 01/12/2011   Influenza, High Dose Seasonal PF 11/29/2011, 12/18/2012, 12/10/2013, 02/02/2015, 12/16/2016, 12/01/2017, 01/08/2020, 01/05/2021   Influenza, Seasonal, Injecte, Preservative Fre 03/14/2009, 12/08/2015   Influenza,inj,quad, With Preservative 03/14/2009   Influenza-Unspecified 01/11/2007, 02/04/2008, 01/01/2009, 03/14/2009, 04/19/2010, 12/14/2010, 01/12/2011, 12/08/2015   Moderna Sars-Covid-2 Vaccination 04/12/2019, 05/08/2019   Pneumococcal Conjugate-13 04/08/2014   Pneumococcal Polysaccharide-23 01/01/2009, 03/14/2009   Pneumococcal-Unspecified 03/14/2009   Tdap 03/14/2004, 03/14/2006    Diabetes Related Lab Review: Lab Results  Component Value Date   HGBA1C 6.3 (A) 12/22/2022   HGBA1C 7.3 (H) 06/01/2022   HGBA1C 7.0 (H) 04/13/2022    Lab Results  Component Value Date   MICROALBUR 2.4 (H) 06/01/2022   Lab Results  Component Value Date   CREATININE 1.47 (H) 06/01/2022   BUN 23 06/01/2022   NA 139 06/01/2022   K 4.3 06/01/2022   CL 103 06/01/2022   CO2 27 06/01/2022   Lab Results  Component Value Date   CHOL 90 04/13/2022   CHOL 122 04/12/2021   CHOL 134 10/28/2019   Lab Results  Component Value Date   HDL 24.70 (L) 04/13/2022   HDL 51.10 04/12/2021   HDL 60 10/28/2019   Lab Results  Component Value Date   LDLCALC  45 04/13/2022   LDLCALC 54 04/12/2021   LDLCALC 56 10/28/2019   Lab Results  Component Value Date   TRIG 97.0 04/13/2022   TRIG 85.0 04/12/2021   TRIG 94 10/28/2019   Lab Results  Component Value Date   CHOLHDL 4 04/13/2022   CHOLHDL 2 04/12/2021   CHOLHDL 2.2 10/28/2019   No results found for: "LDLDIRECT" The ASCVD Risk score (Arnett DK, et al., 2019) failed to calculate for  the following reasons:   The 2019 ASCVD risk score is only valid for ages 50 to 70   The patient has a prior MI or stroke diagnosis I have reviewed the PMH, Fam and Soc history. Patient Active Problem List   Diagnosis Date Noted Date Diagnosed   Type 2 diabetes mellitus without complication, without long-term current use of insulin (HCC) 12/22/2022     Priority: High    Newly diagnosed 2024. Diet controlled.     Hypothyroidism (acquired) 04/20/2022     Priority: High   Hemorrhagic cerebrovascular accident (CVA) (HCC) 04/20/2018     Priority: High    Left frontal lobe by MRI 03/2018; Dr. Wynetta Emery NS Secondary subdural hematoma    Current use of long term anticoagulation 07/07/2015     Priority: High    Stopped 2020; no recurrence of PAF since ablation. Stopped due to hemorrhagic cva.     Major depression, recurrent, chronic (HCC) 02/12/2015     Priority: High   Coronary artery disease involving native coronary artery of native heart without angina pectoris 05/16/2012     Priority: High    Cardiac MRI - EF normalized to 55% from 40%, no wall motion abnormalities nor scarring For lexiscan stress test 07/2013 - unremarkable/cla     Generalized anxiety disorder 01/10/2012     Priority: High    Triggers are winter months, snow, darkness, family problems: has panic sxs. Controlled on prn xanax and zolft.     History of non-ST elevation myocardial infarction (NSTEMI) 12/05/2011     Priority: High    Overview:  12/02/11 - [facility], s/p PTCA RCA, nonSTEMI EF 60%    Mixed hyperlipidemia 12/31/2010     Priority: High    Overview:  Goal LDL < 80; cards managing    Paroxysmal atrial fibrillation (HCC) 12/14/2010     Priority: High    No recent paf; no longer on anticoagulation per cards due to h/o cva hemorrhagic Heart doc said ok to use celebrex Recurrent 2023: restarted eloquis; scheduled for ablation 05/2022     Essential hypertension 01/09/2009     Priority: High    Fracture Risk Assessment Score (FRAX) indicating greater than 3% risk for hip fracture 10/18/2021     Priority: Medium    Subclinical hypothyroidism 03/24/2017     Priority: Medium     Started synthroid 25 03/2017 for increasing TSH.    DJD (degenerative joint disease), cervical 05/12/2016     Priority: Medium    Foraminal stenosis of cervical region 08/18/2015     Priority: Medium     Overview:  xrays 2017; multilevel DJD    Chronic pain of both knees 02/12/2015     Priority: Medium    Zenker diverticulum 05/08/2014     Priority: Medium     Overview:  By upper GI study.  GI is following.  Patient hoping to avoid surgical repair.  2016    Osteopenia 06/08/2012     Priority: Medium     Overview:  DEXA 2016  T=-0.2 lumbar spine, -1.6 at left femur. Stable 2016, mild worsening Dexa 07/2017: stable at femur; osteopenia. Recheck 2 years.  DEXA 09/2021: osteopenia, T = -2.2, recheck 2 years, FRAX score 17% major frx, 5.8% hip, discussed biphosphonate    Gastro-esophageal reflux disease without esophagitis 08/12/2011     Priority: Medium    Osteoarthritis, hand 12/31/2010     Priority: Low   Rosacea 12/31/2010     Priority: Low    Social History: Patient  reports that she has never smoked. She has never used smokeless tobacco. She reports current alcohol use of about 2.0 - 3.0 standard drinks of alcohol per week. She reports that she does not use drugs.  Review of Systems: Ophthalmic: negative for eye pain, loss of vision or double vision Cardiovascular: negative for chest pain Respiratory: negative for SOB or persistent cough Gastrointestinal: negative for abdominal pain Genitourinary: negative for dysuria or gross hematuria MSK: negative for foot lesions Neurologic: negative for weakness or gait disturbance  Objective  Vitals: BP 128/70   Pulse 64   Temp 97.8 F (36.6 C)   Ht 5\' 2"  (1.575 m)   Wt 127 lb 9.6 oz (57.9 kg)   SpO2 98%   BMI 23.34 kg/m  General: well  appearing, no acute distress  Psych:  Alert and oriented, normal mood and affect HEENT:  Normocephalic, atraumatic, moist mucous membranes, supple neck  Cardiovascular:  RRR Respiratory:  Good breath sounds bilaterally, CTAB with normal effort, no rales   Diabetic education: ongoing education regarding chronic disease management for diabetes was given today. We continue to reinforce the ABC's of diabetic management: A1c (<7 or 8 dependent upon patient), tight blood pressure control, and cholesterol management with goal LDL < 100 minimally. We discuss diet strategies, exercise recommendations, medication options and possible side effects. At each visit, we review recommended immunizations and preventive care recommendations for diabetics and stress that good diabetic control can prevent other problems. See below for this patient's data.   Commons side effects, risks, benefits, and alternatives for medications and treatment plan prescribed today were discussed, and the patient expressed understanding of the given instructions. Patient is instructed to call or message via MyChart if he/she has any questions or concerns regarding our treatment plan. No barriers to understanding were identified. We discussed Red Flag symptoms and signs in detail. Patient expressed understanding regarding what to do in case of urgent or emergency type symptoms.  Medication list was reconciled, printed and provided to the patient in AVS. Patient instructions and summary information was reviewed with the patient as documented in the AVS. This note was prepared with assistance of Dragon voice recognition software. Occasional wrong-word or sound-a-like substitutions may have occurred due to the inherent limitations of voice recognition software

## 2022-12-23 DIAGNOSIS — I480 Paroxysmal atrial fibrillation: Secondary | ICD-10-CM | POA: Diagnosis not present

## 2022-12-26 DIAGNOSIS — I4891 Unspecified atrial fibrillation: Secondary | ICD-10-CM | POA: Diagnosis not present

## 2022-12-29 ENCOUNTER — Telehealth: Payer: Self-pay | Admitting: Family Medicine

## 2022-12-29 ENCOUNTER — Other Ambulatory Visit: Payer: Self-pay

## 2022-12-29 DIAGNOSIS — H903 Sensorineural hearing loss, bilateral: Secondary | ICD-10-CM | POA: Diagnosis not present

## 2022-12-29 DIAGNOSIS — K225 Diverticulum of esophagus, acquired: Secondary | ICD-10-CM | POA: Diagnosis not present

## 2022-12-29 DIAGNOSIS — E039 Hypothyroidism, unspecified: Secondary | ICD-10-CM

## 2022-12-29 DIAGNOSIS — R1314 Dysphagia, pharyngoesophageal phase: Secondary | ICD-10-CM | POA: Diagnosis not present

## 2022-12-29 DIAGNOSIS — H6123 Impacted cerumen, bilateral: Secondary | ICD-10-CM | POA: Diagnosis not present

## 2022-12-29 MED ORDER — LEVOTHYROXINE SODIUM 88 MCG PO TABS
88.0000 ug | ORAL_TABLET | Freq: Every day | ORAL | 3 refills | Status: DC
Start: 2022-12-29 — End: 2024-01-03

## 2022-12-29 NOTE — Progress Notes (Signed)
Please call patient: labs show that liver tests are high again: higher than they have been: is she taking tylenol or alcohol regularly. Need to stop. Hold crestor for now. Needs to have further evaluation: GI can do this or me. Will need another office visit in 3-4 weeks.  Also, thyroid is low again, please increase to daily; and order for patient.

## 2022-12-29 NOTE — Telephone Encounter (Signed)
Called pt to get scheduled for 3-4 week f/u with PCP following lab results. No vm set up. Sent [employer].

## 2023-01-05 NOTE — Telephone Encounter (Signed)
Called and spoke with pt about need for F/U. Pt states she will callback to schedule.

## 2023-01-09 NOTE — Telephone Encounter (Signed)
Patient has been scheduled 01/23/23.

## 2023-01-23 ENCOUNTER — Ambulatory Visit: Payer: Medicare Other | Admitting: Family Medicine

## 2023-01-28 IMAGING — MG MM DIGITAL SCREENING BILAT W/ TOMO AND CAD
8 series · 8 of 24 positions shown · non-contrast
Comparison: Previous exam(s).

CLINICAL DATA: Screening.

EXAM:
DIGITAL SCREENING BILATERAL MAMMOGRAM WITH TOMOSYNTHESIS AND CAD
TECHNIQUE: Bilateral screening digital craniocaudal and mediolateral oblique
mammograms were obtained. Bilateral screening digital breast
tomosynthesis was performed. The images were evaluated with
computer-aided detection.

[R CC synth-2D]
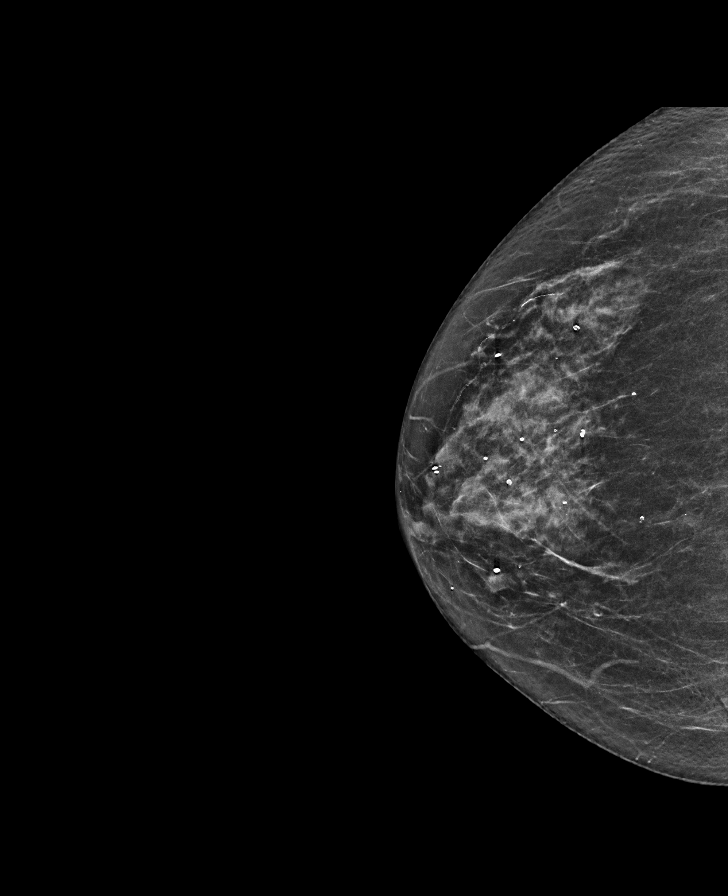

[L MLO synth-2D]
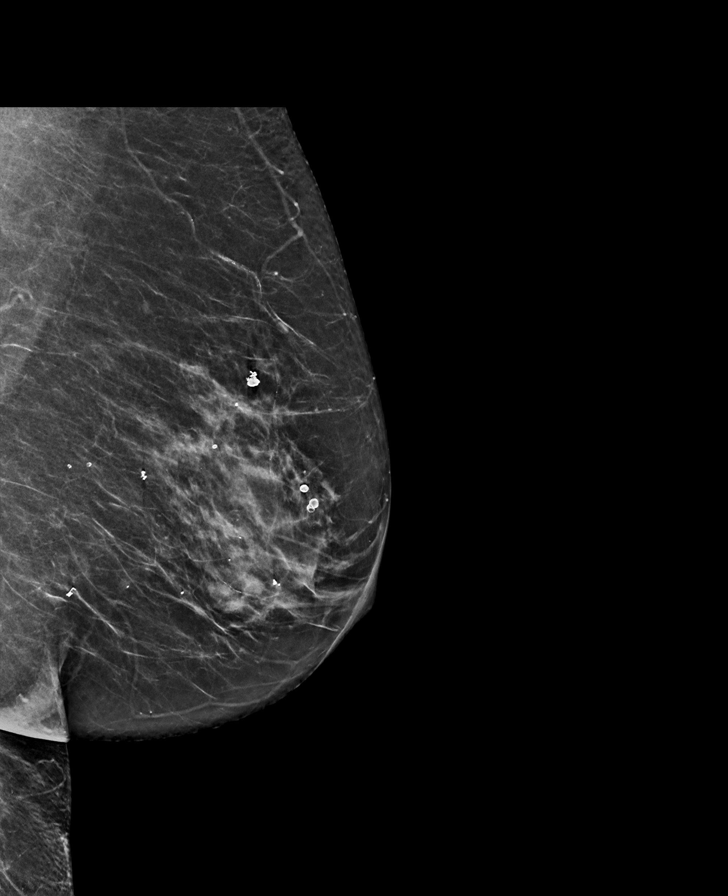

[L CC synth-2D]
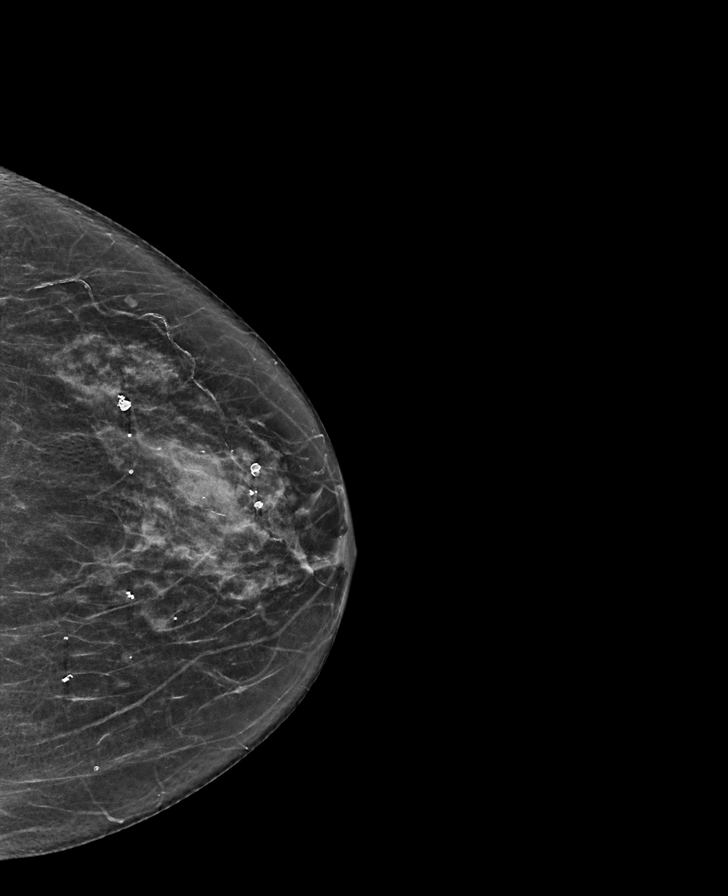

[R MLO synth-2D]
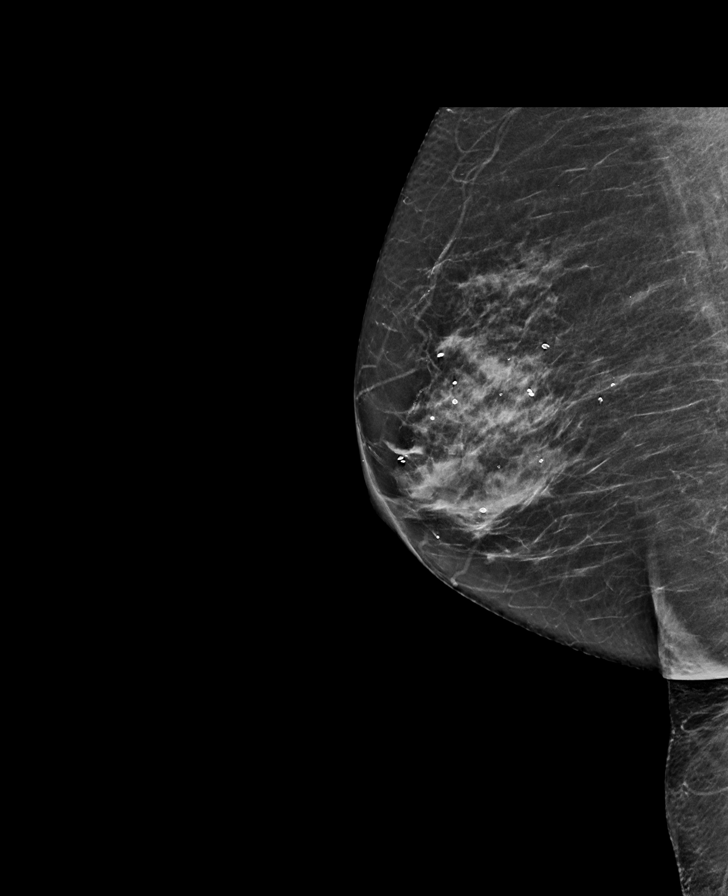

[L MLO tomo · tomo slice 33/64.0]
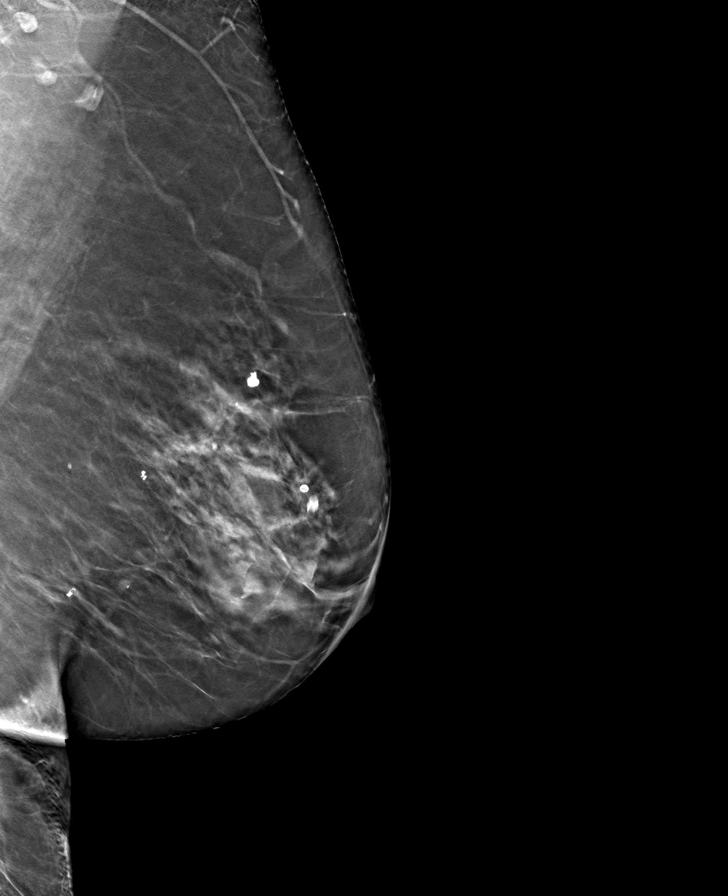

[R MLO tomo · tomo slice 31/61.0]
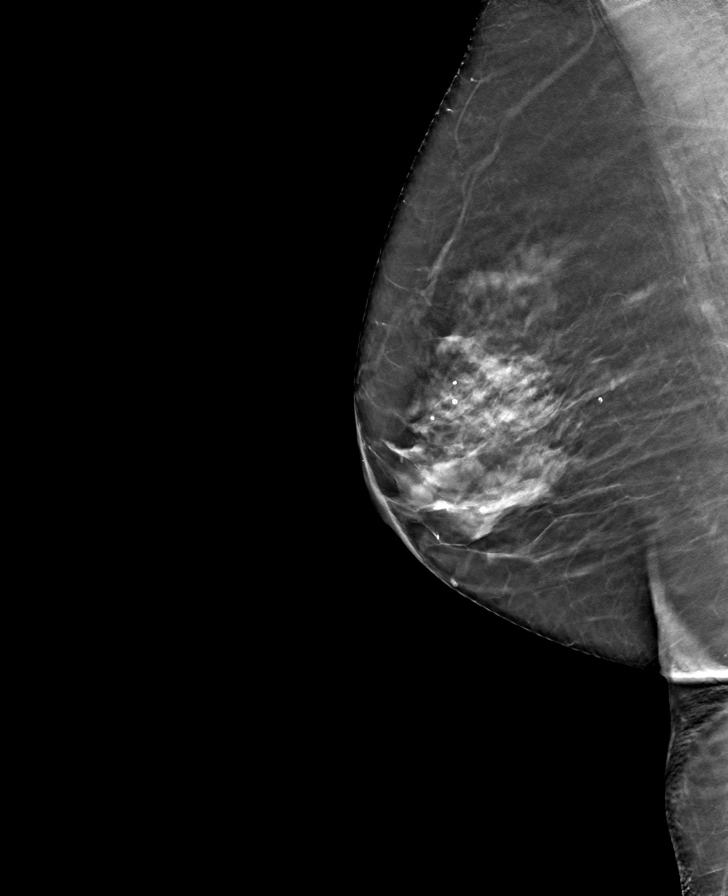

[R CC tomo · tomo slice 30/59.0]
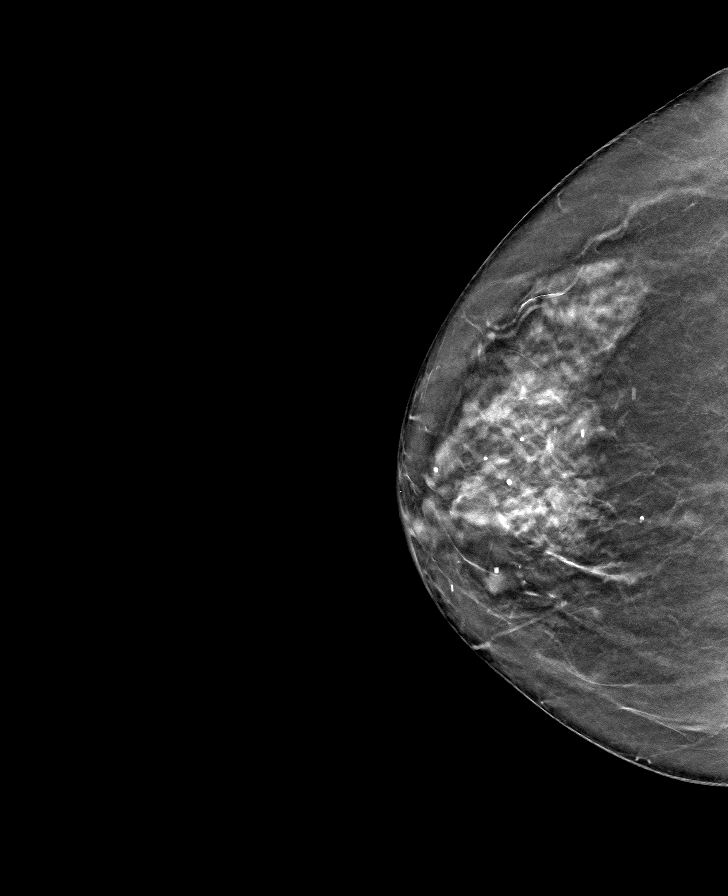

[L CC tomo · tomo slice 29/58.0]
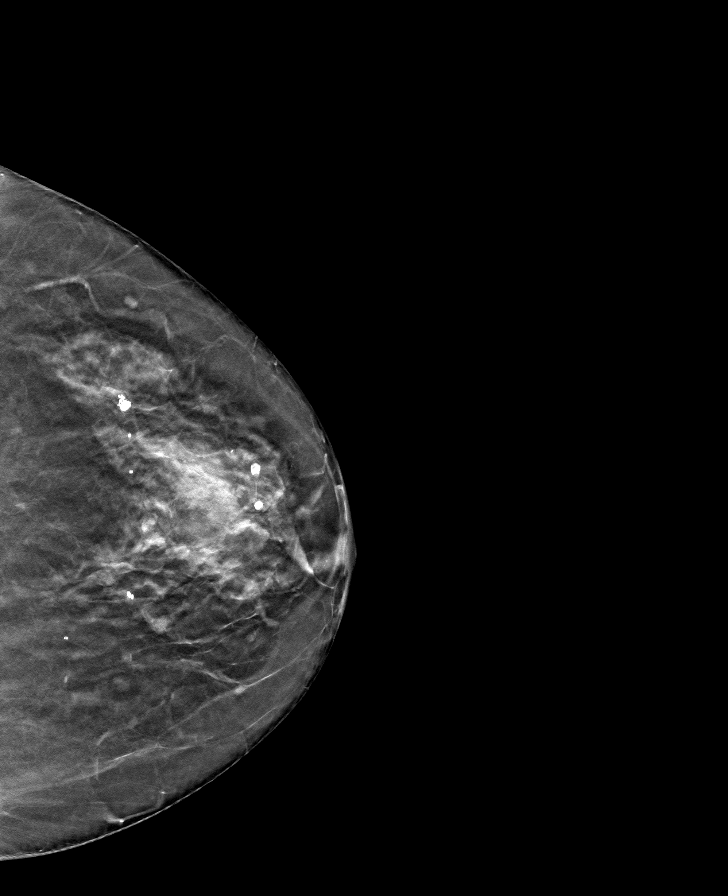

[8 of 24 positions shown; findings below may reference images not displayed]

ACR Breast Density Category c: The breast tissue is heterogeneously
dense, which may obscure small masses.
FINDINGS: There are no findings suspicious for malignancy. The images were
evaluated with computer-aided detection.
IMPRESSION: No mammographic evidence of malignancy. A result letter of this
screening mammogram will be mailed directly to the patient.

RECOMMENDATION:
Screening mammogram in one year. (Code:T4-5-GWO)

BI-RADS CATEGORY  1: Negative.

## 2023-01-31 DIAGNOSIS — L718 Other rosacea: Secondary | ICD-10-CM | POA: Diagnosis not present

## 2023-02-01 ENCOUNTER — Encounter: Payer: Self-pay | Admitting: Family Medicine

## 2023-02-01 ENCOUNTER — Ambulatory Visit (INDEPENDENT_AMBULATORY_CARE_PROVIDER_SITE_OTHER): Payer: Medicare Other | Admitting: Family Medicine

## 2023-02-01 VITALS — BP 120/64 | HR 81 | Temp 98.1°F | Ht 62.0 in | Wt 128.4 lb

## 2023-02-01 DIAGNOSIS — E039 Hypothyroidism, unspecified: Secondary | ICD-10-CM | POA: Diagnosis not present

## 2023-02-01 DIAGNOSIS — E119 Type 2 diabetes mellitus without complications: Secondary | ICD-10-CM

## 2023-02-01 DIAGNOSIS — R7989 Other specified abnormal findings of blood chemistry: Secondary | ICD-10-CM | POA: Insufficient documentation

## 2023-02-01 DIAGNOSIS — E782 Mixed hyperlipidemia: Secondary | ICD-10-CM

## 2023-02-01 LAB — HEPATIC FUNCTION PANEL
ALT: 29 U/L (ref 0–35)
AST: 45 U/L — ABNORMAL HIGH (ref 0–37)
Albumin: 4.2 g/dL (ref 3.5–5.2)
Alkaline Phosphatase: 109 U/L (ref 39–117)
Bilirubin, Direct: 0.1 mg/dL (ref 0.0–0.3)
Total Bilirubin: 0.6 mg/dL (ref 0.2–1.2)
Total Protein: 6.5 g/dL (ref 6.0–8.3)

## 2023-02-01 LAB — TSH: TSH: 1.98 u[IU]/mL (ref 0.35–5.50)

## 2023-02-01 LAB — GAMMA GT: GGT: 44 U/L (ref 7–51)

## 2023-02-01 NOTE — Progress Notes (Signed)
Subjective  CC:  Chief Complaint  Patient presents with   Hypertension   Diabetes   lab results    Pt stated that she was to F/U on some lab results that had high readings(Liver) along with some other reading and medication increase/decreased. Eye exam has not been scheduled    HPI: Brittney Cochran is a 82 y.o. female who presents to the office today to address the problems listed above in the chief complaint. Discussed the use of AI scribe software for clinical note transcription with the patient, who gave verbal consent to proceed.  History of Present Illness   The patient, with a history of elevated liver tests and thyroid issues, presents to f/u on liver tests and thyroid. endorses occasional abdominal discomfort described as a muscle-like soreness. She denies any yellowing of the skin or eyes, nausea, or vomiting. She has not been consuming alcohol for the past three to four months. She has been taking thyroid medication and Crestor, but has stopped Crestor since her last consultation.  She had mild elevations in LFTs at the end of last year and early part of this year.  They did normalize.  Abdominal ultrasound at that time was normal.  The patient also reports a recent bout of fever blisters and a flare-up of rosacea, which she attributes to stress related to property damage. She has been prescribed new medications for these conditions, which she has yet to start.  In addition, the patient has been experiencing a feeling of abdominal bloating and gas. She has not seen her gastroenterologist since her last consultation. She has been taking small amounts of gummies for relief. She reports occasional pain in the abdomen after eating, particularly if she consumes too much of a certain food due to a condition known as Zinkers.  The patient also reports tenderness in the right upper quadrant of the abdomen on examination. She confirms she still has her gallbladder. She has been asked to hold  off on restarting Crestor until further notice.     Lab Results  Component Value Date   TSH 6.34 (H) 12/22/2022   Lab Results  Component Value Date   ALT 115 (H) 12/22/2022   AST 170 (H) 12/22/2022   ALKPHOS 99 12/22/2022   BILITOT 0.5 12/22/2022    Assessment  1. Elevated LFTs   2. Mixed hyperlipidemia   3. Hypothyroidism (acquired)   4. Type 2 diabetes mellitus without complication, without long-term current use of insulin (HCC)      Plan  Assessment and Plan    Elevated Liver Enzymes   Intermittent abdominal soreness. Mild elevated liver tests earlier this year, which improved. No recent alcohol consumption. No jaundice, nausea, or vomiting. Possible causes include previous COVID-19 infection. Liver tests were previously three to four times normal levels.   - Order comprehensive blood work to assess liver function   - Hold Crestor until further notice   - Consider referral to liver or GI specialist based on blood work results    Hypothyroidism   Thyroid medication dosage recently increased. Will check thyroid function to ensure appropriate levels.   - Check thyroid function with blood work    Rosacea   Reports a flare-up of rosacea. Recently started new medications for management.   - Continue current rosacea medications    Herpes Labialis   Recent bout of fever blisters, likely exacerbated by stress. Recently prescribed medication to prevent recurrence. She has not yet picked up the medication.   -  Start newly prescribed antiviral medication   - Use topical cream as directed    General Health Maintenance   No alcohol consumption for the past 3-4 months. No recent visits to the gastroenterologist. No significant changes in general health.   - Continue to avoid alcohol    Follow-up   - Follow up after blood work results are available to determine next steps.      Follow up: As scheduled 05/22/2023  Orders Placed This Encounter  Procedures   Hepatic function  panel   Hepatitis B core antibody, total   Hepatitis B surface antigen   Hepatitis C antibody   Anti-smooth muscle antibody, IgG   Gamma GT   Antinuclear Antib (ANA)   Mitochondrial antibodies   IgG   IgM   TSH   No orders of the defined types were placed in this encounter.     I reviewed the patients updated PMH, FH, and SocHx.    Patient Active Problem List   Diagnosis Date Noted   Type 2 diabetes mellitus without complication, without long-term current use of insulin (HCC) 12/22/2022    Priority: High   Hypothyroidism (acquired) 04/20/2022    Priority: High   Hemorrhagic cerebrovascular accident (CVA) (HCC) 04/20/2018    Priority: High   Current use of long term anticoagulation 07/07/2015    Priority: High   Major depression, recurrent, chronic (HCC) 02/12/2015    Priority: High   Coronary artery disease involving native coronary artery of native heart without angina pectoris 05/16/2012    Priority: High   Generalized anxiety disorder 01/10/2012    Priority: High   History of non-ST elevation myocardial infarction (NSTEMI) 12/05/2011    Priority: High   Mixed hyperlipidemia 12/31/2010    Priority: High   Paroxysmal atrial fibrillation (HCC) 12/14/2010    Priority: High   Essential hypertension 01/09/2009    Priority: High   Fracture Risk Assessment Score (FRAX) indicating greater than 3% risk for hip fracture 10/18/2021    Priority: Medium    Subclinical hypothyroidism 03/24/2017    Priority: Medium    DJD (degenerative joint disease), cervical 05/12/2016    Priority: Medium    Foraminal stenosis of cervical region 08/18/2015    Priority: Medium    Chronic pain of both knees 02/12/2015    Priority: Medium    Zenker diverticulum 05/08/2014    Priority: Medium    Osteopenia 06/08/2012    Priority: Medium    Gastro-esophageal reflux disease without esophagitis 08/12/2011    Priority: Medium    Osteoarthritis, hand 12/31/2010    Priority: Low   Rosacea  12/31/2010    Priority: Low   Elevated LFTs 02/01/2023   Current Meds  Medication Sig   ALPRAZolam (XANAX) 0.5 MG tablet Take 1 tablet (0.5 mg total) by mouth daily as needed.   amiodarone (PACERONE) 200 MG tablet Take 200 mg by mouth daily.   aspirin EC 81 MG tablet Take 81 mg by mouth daily. Swallow whole.   carvedilol (COREG) 12.5 MG tablet Take 6.25 mg by mouth in the morning and at bedtime.   Co-Enzyme Q-10 30 MG CAPS Take by mouth.   ELIQUIS 5 MG TABS tablet Take 5 mg by mouth 2 (two) times daily.   famotidine (PEPCID) 20 MG tablet TAKE 1 TABLET(20 MG) BY MOUTH AT BEDTIME   fluticasone (FLONASE) 50 MCG/ACT nasal spray USE 1 SPRAY NASALLY DAILY   levothyroxine (SYNTHROID) 88 MCG tablet Take 1 tablet (88 mcg total) by mouth daily.  lisinopril (PRINIVIL,ZESTRIL) 2.5 MG tablet Take 2.5 mg by mouth daily.    nitroGLYCERIN (NITROSTAT) 0.4 MG SL tablet Place 0.4 mg under the tongue.   omeprazole (PRILOSEC) 40 MG capsule TAKE 1 CAPSULE(40 MG) BY MOUTH DAILY   rosuvastatin (CRESTOR) 40 MG tablet Take 40 mg by mouth daily.    sertraline (ZOLOFT) 100 MG tablet Take 1.5 tablets (150 mg total) by mouth daily.   VITAMIN D, CHOLECALCIFEROL, PO Take by mouth.    Allergies: Patient is allergic to clindamycin. Family History: Patient family history includes Alcohol abuse in her father; Atrial fibrillation in her mother; Diabetes in her brother, father, and paternal grandmother; Early death in her father and maternal grandfather; Heart attack in her father, maternal uncle, and paternal grandmother; Kidney disease in her maternal grandmother; Stroke in her mother, paternal aunt, and son; Transient ischemic attack in her mother. Social History:  Patient  reports that she has never smoked. She has never used smokeless tobacco. She reports current alcohol use of about 2.0 - 3.0 standard drinks of alcohol per week. She reports that she does not use drugs.  Review of Systems: Constitutional: Negative  for fever malaise or anorexia Cardiovascular: negative for chest pain Respiratory: negative for SOB or persistent cough Gastrointestinal: negative for abdominal pain  Objective  Vitals: BP 120/64   Pulse 81   Temp 98.1 F (36.7 C)   Ht 5\' 2"  (1.575 m)   Wt 128 lb 6.4 oz (58.2 kg)   SpO2 99%   BMI 23.48 kg/m  General: no acute distress , A&Ox3 HEENT: PEERL, conjunctiva normal, neck is supple Cardiovascular:  RRR without murmur or gallop.  Respiratory:  Good breath sounds bilaterally, CTAB with normal respiratory effort Gastrointestinal: soft, flat abdomen, normal active bowel sounds, no palpable masses, no hepatosplenomegaly, no appreciated hernias Minimal right upper quadrant tenderness without guarding or rebound Skin:  Warm, no rashes, no jaundice  Commons side effects, risks, benefits, and alternatives for medications and treatment plan prescribed today were discussed, and the patient expressed understanding of the given instructions. Patient is instructed to call or message via MyChart if he/she has any questions or concerns regarding our treatment plan. No barriers to understanding were identified. We discussed Red Flag symptoms and signs in detail. Patient expressed understanding regarding what to do in case of urgent or emergency type symptoms.  Medication list was reconciled, printed and provided to the patient in AVS. Patient instructions and summary information was reviewed with the patient as documented in the AVS. This note was prepared with assistance of Dragon voice recognition software. Occasional wrong-word or sound-a-like substitutions may have occurred due to the inherent limitations of voice recognition software

## 2023-02-01 NOTE — Patient Instructions (Addendum)
Please follow up as scheduled for your next visit with me: 05/22/2023   I will release your lab results to you on your MyChart account with further instructions. You may see the results before I do, but when I review them I will send you a message with my report or have my assistant call you if things need to be discussed. Please reply to my message with any questions. Thank you!   If you have any questions or concerns, please don't hesitate to send me a message via MyChart or call the office at [phone]. Thank you for visiting with Brittney Cochran today! It's our pleasure caring for you.   VISIT SUMMARY:  During today's visit, we discussed your recent health concerns, including occasional abdominal discomfort, a recent bout of fever blisters, a flare-up of rosacea, and feelings of abdominal bloating and gas. We reviewed your history of elevated liver tests and thyroid issues, and you provided updates on your current medications and lifestyle changes.  YOUR PLAN:  -ELEVATED LIVER ENZYMES: Elevated liver enzymes can indicate liver inflammation or damage. We will order comprehensive blood work to assess your liver function and have advised you to hold off on restarting Crestor until further notice. Based on the blood work results, we may consider referring you to a liver or gastrointestinal specialist.  -HYPOTHYROIDISM: Hypothyroidism is a condition where the thyroid gland does not produce enough thyroid hormone. We recently increased your thyroid medication dosage and will check your thyroid function with blood work to ensure your levels are appropriate.  -ROSACEA: Rosacea is a skin condition that causes redness and visible blood vessels in your face. You reported a flare-up and have recently started new medications for management. Please continue with your current rosacea medications.  -HERPES LABIALIS: Herpes labialis, commonly known as fever blisters, are caused by the herpes simplex virus. You experienced  a recent bout likely due to stress and have been prescribed antiviral medication to prevent recurrence. Please start the newly prescribed antiviral medication and use the topical cream as directed.  -GENERAL HEALTH MAINTENANCE: You have not consumed alcohol for the past 3-4 months, which is beneficial for your liver health. Please continue to avoid alcohol.  INSTRUCTIONS:  Please follow up after your blood work results are available so we can determine the next steps.

## 2023-02-02 NOTE — Progress Notes (Signed)
See mychart note Dear Brittney Cochran, Good news, your liver test are almost back to normal.  Your thyroid level is at goal now as well.  Further test results are still pending and I will follow-up on them.  However at this time, I believe your elevated liver tests are due to the statin.  Please stop the Crestor.  Do not restart it.  Continue your thyroid dosing as is.  I will keep an eye on this for you.  You will need to discuss this with your cardiologist.  You may want to try different medications for your cholesterol. Sincerely, Dr. Mardelle Matte

## 2023-02-03 ENCOUNTER — Telehealth: Payer: Self-pay | Admitting: Family Medicine

## 2023-02-03 ENCOUNTER — Other Ambulatory Visit: Payer: Self-pay

## 2023-02-03 MED ORDER — SERTRALINE HCL 100 MG PO TABS: 150.0000 mg | ORAL_TABLET | Freq: Every day | ORAL | 3 refills | Status: DC

## 2023-02-03 NOTE — Telephone Encounter (Signed)
Rx sent 

## 2023-02-03 NOTE — Telephone Encounter (Signed)
Prescription Request  Patient states she ran out of the medication and forgot to tell Dr. Mardelle Matte at [facility] on 02/01/23-requests RX be sent asap-states she is sorry  02/03/2023  LOV: 02/01/2023  What is the name of the medication or equipment? sertraline (ZOLOFT) 100 MG tablet   Have you contacted your pharmacy to request a refill? Yes   Which pharmacy would you like this sent to?  [id number] - [place], [place] - [address] [place] [address] Phone: [phone] Fax: [phone]    Patient notified that their request is being sent to the clinical staff for review and that they should receive a response within 2 business days.   Please advise at Mobile [phone] (mobile)

## 2023-02-05 LAB — ANTI-SMOOTH MUSCLE ANTIBODY, IGG: Actin (Smooth Muscle) Antibody (IGG): <20 U (ref <20)

## 2023-02-05 LAB — HEPATITIS C ANTIBODY: Hepatitis C Ab: NONREACTIVE

## 2023-02-05 LAB — MITOCHONDRIAL ANTIBODIES: Mitochondrial M2 Ab, IgG: <=20 U (ref <=20.0)

## 2023-02-05 LAB — ANA: Anti Nuclear Antibody (ANA): POSITIVE — AB

## 2023-02-05 LAB — IGM: IgM, Serum: 139 mg/dL (ref 50–300)

## 2023-02-05 LAB — ANTI-NUCLEAR AB-TITER (ANA TITER): ANA Titer 1: 1:80 {titer} — ABNORMAL HIGH

## 2023-02-05 LAB — HEPATITIS B SURFACE ANTIGEN: Hepatitis B Surface Ag: NONREACTIVE

## 2023-02-05 LAB — HEPATITIS B CORE ANTIBODY, TOTAL: Hep B Core Total Ab: NONREACTIVE

## 2023-02-05 LAB — IGG: IgG (Immunoglobin G), Serum: 675 mg/dL (ref 600–1540)

## 2023-02-13 DIAGNOSIS — E038 Other specified hypothyroidism: Secondary | ICD-10-CM | POA: Diagnosis not present

## 2023-02-13 DIAGNOSIS — I252 Old myocardial infarction: Secondary | ICD-10-CM | POA: Diagnosis not present

## 2023-02-13 DIAGNOSIS — I10 Essential (primary) hypertension: Secondary | ICD-10-CM | POA: Diagnosis not present

## 2023-02-13 DIAGNOSIS — I619 Nontraumatic intracerebral hemorrhage, unspecified: Secondary | ICD-10-CM | POA: Diagnosis not present

## 2023-02-13 DIAGNOSIS — I480 Paroxysmal atrial fibrillation: Secondary | ICD-10-CM | POA: Diagnosis not present

## 2023-02-13 DIAGNOSIS — I629 Nontraumatic intracranial hemorrhage, unspecified: Secondary | ICD-10-CM | POA: Diagnosis not present

## 2023-02-13 DIAGNOSIS — I2510 Atherosclerotic heart disease of native coronary artery without angina pectoris: Secondary | ICD-10-CM | POA: Diagnosis not present

## 2023-02-13 DIAGNOSIS — Z96651 Presence of right artificial knee joint: Secondary | ICD-10-CM | POA: Diagnosis not present

## 2023-02-13 DIAGNOSIS — K219 Gastro-esophageal reflux disease without esophagitis: Secondary | ICD-10-CM | POA: Diagnosis not present

## 2023-02-13 DIAGNOSIS — Z9861 Coronary angioplasty status: Secondary | ICD-10-CM | POA: Diagnosis not present

## 2023-02-14 ENCOUNTER — Encounter: Payer: Self-pay | Admitting: Family Medicine

## 2023-02-14 DIAGNOSIS — R768 Other specified abnormal immunological findings in serum: Secondary | ICD-10-CM | POA: Insufficient documentation

## 2023-02-14 NOTE — Progress Notes (Signed)
Low level + ANA and titers ... Non specific and lfts have normalized. No further eval needed at this time. Will monitor for sxs. (Rare in lupus, sjogren's etc .. Unlikely clinically significant)

## 2023-02-16 ENCOUNTER — Encounter: Payer: Self-pay | Admitting: Family Medicine

## 2023-02-16 ENCOUNTER — Ambulatory Visit: Payer: Medicare Other | Admitting: Family Medicine

## 2023-02-16 VITALS — BP 136/78 | HR 94 | Temp 97.7°F | Ht 62.0 in | Wt 127.4 lb

## 2023-02-16 DIAGNOSIS — R7989 Other specified abnormal findings of blood chemistry: Secondary | ICD-10-CM

## 2023-02-16 DIAGNOSIS — T50905A Adverse effect of unspecified drugs, medicaments and biological substances, initial encounter: Secondary | ICD-10-CM | POA: Diagnosis not present

## 2023-02-16 DIAGNOSIS — L719 Rosacea, unspecified: Secondary | ICD-10-CM

## 2023-02-16 DIAGNOSIS — R300 Dysuria: Secondary | ICD-10-CM | POA: Diagnosis not present

## 2023-02-16 DIAGNOSIS — F411 Generalized anxiety disorder: Secondary | ICD-10-CM

## 2023-02-16 DIAGNOSIS — K716 Toxic liver disease with hepatitis, not elsewhere classified: Secondary | ICD-10-CM | POA: Diagnosis not present

## 2023-02-16 LAB — URINALYSIS, ROUTINE W REFLEX MICROSCOPIC
Bilirubin Urine: NEGATIVE
Hgb urine dipstick: NEGATIVE
Ketones, ur: NEGATIVE
Leukocytes,Ua: NEGATIVE
Nitrite: NEGATIVE
RBC / HPF: NONE SEEN (ref 0–?)
Specific Gravity, Urine: 1.025 (ref 1.000–1.030)
Total Protein, Urine: NEGATIVE
Urine Glucose: NEGATIVE
Urobilinogen, UA: 0.2 (ref 0.0–1.0)
pH: 5.5 (ref 5.0–8.0)

## 2023-02-16 MED ORDER — ALPRAZOLAM 0.5 MG PO TABS: 0.2500 mg | ORAL_TABLET | Freq: Every day | ORAL | 0 refills | Status: DC | PRN

## 2023-02-16 NOTE — Progress Notes (Signed)
Subjective  CC:  Chief Complaint  Patient presents with   lab result    Pt has some questions/concerns regarding lab results and also having trouble peeing along with some burning     HPI: Brittney Cochran is a 82 y.o. female who presents to the office today to address the problems listed above in the chief complaint. Discussed the use of AI scribe software for clinical note transcription with the patient, who gave verbal consent to proceed.  History of Present Illness   The patient presents with concerns about recent lab results and potential urinary tract infection (UTI). She reports experiencing burning during urination and a decrease in the usual volume of urine. These symptoms have been intermittent over the past week. She also reports occasional abdominal discomfort, specifically under the ribs and in the lower right quadrant, which she has managed with heat application. The patient denies any blood in the urine, fevers, or chills.  The patient also reports a recent flare-up of rosacea, which she has been managing with dicloxacillin and Valtrex, prescribed by her dermatologist. She started these medications last week. She also mentions a history of heart disease and atrial fibrillation, but reports that her heart doctor recently told her her heart sounded great. She has been off statins due to suspected liver damage, which has since improved.  The patient has a history of anxiety, previously managed with Xanax, but she has been trying to limit her use of this medication. She has been using hemp-based gummies for anxiety management, but reports feeling almost drunk after taking half a gummy. She expresses a preference for Xanax for managing severe anxiety episodes.   We reviewed her liver work up and I printed out the lab results. Lfts have normalized off of statin. Ana + all other labs negative. See below.     Assessment  1. Drug-induced hepatitis   2. Elevated LFTs   3. Dysuria    4. Rosacea   5. Generalized anxiety disorder      Plan  Assessment and Plan  Elevated liver tests:   Previous abnormal liver tests have normalized. ANA test was positive but at a very low level, not consistent with active autoimmune hepatitis. Likely related to previous statin use. Discussed that statins should be avoided and alternative cholesterol-lowering medications should be considered with cardiologist. - Avoid statins - Discuss alternative cholesterol-lowering medications with cardiologist  Urinary Tract Infection (UTI) Reports burning sensation and decreased urine output over the past week. No hematuria, fever, or chills. Mild abdominal tenderness noted. Differential includes UTI and possible gastrointestinal issues due to gas. Prefers to wait for culture results before starting antibiotics due to current antibiotic use. - Collect urine sample for analysis - Monitor lab results and start antibiotics if culture confirms infection - Adjust treatment as necessary  Anxiety Has been controlled on zoloft chronically but uses CBD on occ but having side effects.  Has used Xanax in the past, rare use without side effects.  RDiscussed risks of dependency and agreed on limited use. - Prescribe a limited supply of Xanax for severe anxiety episodes -Appropriate use expectations discussed.  Rosacea Reports exacerbation of rosacea after shaving. Currently on dicloxacillin and Valtrex prescribed by dermatologist. - Continue current medications as prescribed by dermatologist  Cold Sores (Herpes Labialis) Reports recent outbreak of cold sores, currently managed with Valtrex. - Continue Valtrex as prescribed by dermatologist   Follow-up - Follow up with cardiologist - Monitor lab results and adjust treatment as necessary.  Follow up: As scheduled 05/22/2023  Orders Placed This Encounter  Procedures   Urine Culture   Urinalysis, Routine w reflex microscopic   Meds ordered this  encounter  Medications   ALPRAZolam (XANAX) 0.5 MG tablet    Sig: Take 0.5-1 tablets (0.25-0.5 mg total) by mouth daily as needed.    Dispense:  30 tablet    Refill:  0      I reviewed the patients updated PMH, FH, and SocHx.    Patient Active Problem List   Diagnosis Date Noted   Type 2 diabetes mellitus without complication, without long-term current use of insulin (HCC) 12/22/2022    Priority: High   Hypothyroidism (acquired) 04/20/2022    Priority: High   Hemorrhagic cerebrovascular accident (CVA) (HCC) 04/20/2018    Priority: High   Current use of long term anticoagulation 07/07/2015    Priority: High   Major depression, recurrent, chronic (HCC) 02/12/2015    Priority: High   Coronary artery disease involving native coronary artery of native heart without angina pectoris 05/16/2012    Priority: High   Generalized anxiety disorder 01/10/2012    Priority: High   History of non-ST elevation myocardial infarction (NSTEMI) 12/05/2011    Priority: High   Mixed hyperlipidemia 12/31/2010    Priority: High   Paroxysmal atrial fibrillation (HCC) 12/14/2010    Priority: High   Essential hypertension 01/09/2009    Priority: High   Elevated LFTs 02/01/2023    Priority: Medium    Fracture Risk Assessment Score (FRAX) indicating greater than 3% risk for hip fracture 10/18/2021    Priority: Medium    Subclinical hypothyroidism 03/24/2017    Priority: Medium    DJD (degenerative joint disease), cervical 05/12/2016    Priority: Medium    Foraminal stenosis of cervical region 08/18/2015    Priority: Medium    Chronic pain of both knees 02/12/2015    Priority: Medium    Zenker diverticulum 05/08/2014    Priority: Medium    Osteopenia 06/08/2012    Priority: Medium    Gastro-esophageal reflux disease without esophagitis 08/12/2011    Priority: Medium    Positive ANA (antinuclear antibody) 02/14/2023    Priority: Low   Osteoarthritis, hand 12/31/2010    Priority: Low   Rosacea  12/31/2010    Priority: Low   No outpatient medications have been marked as taking for the 02/16/23 encounter (Office Visit) with Willow Ora, MD.    Allergies: Patient is allergic to clindamycin. Family History: Patient family history includes Alcohol abuse in her father; Atrial fibrillation in her mother; Diabetes in her brother, father, and paternal grandmother; Early death in her father and maternal grandfather; Heart attack in her father, maternal uncle, and paternal grandmother; Kidney disease in her maternal grandmother; Stroke in her mother, paternal aunt, and son; Transient ischemic attack in her mother. Social History:  Patient  reports that she has never smoked. She has never used smokeless tobacco. She reports current alcohol use of about 2.0 - 3.0 standard drinks of alcohol per week. She reports that she does not use drugs.  Review of Systems: Constitutional: Negative for fever malaise or anorexia Cardiovascular: negative for chest pain Respiratory: negative for SOB or persistent cough Gastrointestinal: negative for abdominal pain  Objective  Vitals: BP 136/78   Pulse 94   Temp 97.7 F (36.5 C)   Ht 5\' 2"  (1.575 m)   Wt 127 lb 6.4 oz (57.8 kg)   SpO2 97%   BMI 23.30 kg/m  General: no acute distress , A&Ox3, appears well HEENT: PEERL, conjunctiva normal, neck is supple Cardiovascular:  RRR without murmur or gallop.  Respiratory:  Good breath sounds bilaterally, CTAB with normal respiratory effort Abdomen: Soft, nontender, no suprapubic tenderness, no CVA tenderness Skin:  Warm, no rashes  No visits with results within 1 Day(s) from this visit.  Latest known visit with results is:  Office Visit on 02/01/2023  Component Date Value Ref Range Status   Total Bilirubin 02/01/2023 0.6  0.2 - 1.2 mg/dL Final   Bilirubin, Direct 02/01/2023 0.1  0.0 - 0.3 mg/dL Final   Alkaline Phosphatase 02/01/2023 109  39 - 117 U/L Final   AST 02/01/2023 45 (H)  0 - 37 U/L Final    ALT 02/01/2023 29  0 - 35 U/L Final   Total Protein 02/01/2023 6.5  6.0 - 8.3 g/dL Final   Albumin 62/13/0865 4.2  3.5 - 5.2 g/dL Final   Hep B Core Total Ab 02/01/2023 NON-REACTIVE  NON-REACTIVE Final   Hepatitis B Surface Ag 02/01/2023 NON-REACTIVE  NON-REACTIVE Final   Hepatitis C Ab 02/01/2023 NON-REACTIVE  NON-REACTIVE Final   Actin (Smooth Muscle) Antibody (IG* 02/01/2023 <20  <20 U Final   GGT 02/01/2023 44  7 - 51 U/L Final   Anti Nuclear Antibody (ANA) 02/01/2023 POSITIVE (A)  NEGATIVE Final   Mitochondrial M2 Ab, IgG 02/01/2023 <=20.0  <=20.0 U Final   IgG (Immunoglobin G), Serum 02/01/2023 675  600 - 1,540 mg/dL Final   IgM, Serum 78/46/9629 139  50 - 300 mg/dL Final   TSH 52/84/1324 1.98  0.35 - 5.50 uIU/mL Final   ANA Titer 1 02/01/2023 1:80 (H)  titer Final   ANA Pattern 1 02/01/2023 Mitotic, Chromosomal (A)   Final    Commons side effects, risks, benefits, and alternatives for medications and treatment plan prescribed today were discussed, and the patient expressed understanding of the given instructions. Patient is instructed to call or message via MyChart if he/she has any questions or concerns regarding our treatment plan. No barriers to understanding were identified. We discussed Red Flag symptoms and signs in detail. Patient expressed understanding regarding what to do in case of urgent or emergency type symptoms.  Medication list was reconciled, printed and provided to the patient in AVS. Patient instructions and summary information was reviewed with the patient as documented in the AVS. This note was prepared with assistance of Dragon voice recognition software. Occasional wrong-word or sound-a-like substitutions may have occurred due to the inherent limitations of voice recognition software

## 2023-02-16 NOTE — Patient Instructions (Addendum)
Please return in 3-6 months for your physical  If you have any questions or concerns, please don't hesitate to send me a message via MyChart or call the office at [phone]. Thank you for visiting with [place] today! It's our pleasure caring for you.   VISIT SUMMARY:  During today's visit, we discussed your recent lab results and addressed your concerns about a potential urinary tract infection (UTI), anxiety, rosacea, cold sores, and your history of autoimmune hepatitis. We also reviewed your general health maintenance and recent lab results.  YOUR PLAN:  -URINARY TRACT INFECTION (UTI): A UTI is an infection in any part of your urinary system. You reported burning during urination and decreased urine output. We will collect a urine sample for analysis and monitor the lab results. If the culture confirms an infection, we will start antibiotics.  -ANXIETY: Anxiety is a feeling of worry or fear that can be intense. You have been managing it with CBD gummies but prefer Xanax for severe episodes. We discussed the risks of dependency and agreed to prescribe a limited supply of Xanax for severe anxiety episodes. continue your zoloft  -ROSACEA: Rosacea is a skin condition that causes redness and visible blood vessels on your face. You reported a flare-up after shaving and are currently on dicloxacillin and Valtrex. Continue taking these medications as prescribed by your dermatologist.  -COLD SORES (HERPES LABIALIS): Cold sores are small blisters caused by the herpes simplex virus. You reported a recent outbreak and are managing it with Valtrex. Continue taking Valtrex as prescribed by your dermatologist.  -drug induced HEPATITIS (RESOLVED):  Avoid statins and discuss alternative cholesterol-lowering medications with your cardiologist.  -GENERAL HEALTH MAINTENANCE: Your liver function tests have normalized, and there were no other significant abnormalities in your recent lab results. Continue with your  current lifestyle and health practices.  INSTRUCTIONS:  Please follow up with your cardiologist and monitor your lab results. We will adjust your treatment as necessary based on the results.

## 2023-02-17 NOTE — Progress Notes (Signed)
See mychart note Dear Ms. Michaux, Your initial urine test does not look infected. I will await the culture and encourage you to increase your water intake.  Sincerely, Dr. Mardelle Matte

## 2023-02-18 LAB — URINE CULTURE
MICRO NUMBER:: 15813677
SPECIMEN QUALITY:: ADEQUATE

## 2023-02-20 MED ORDER — AMOXICILLIN 875 MG PO TABS: 875.0000 mg | ORAL_TABLET | Freq: Two times a day (BID) | ORAL | 0 refills | Status: AC

## 2023-02-20 NOTE — Addendum Note (Signed)
Addended by: Asencion Partridge on: 02/20/2023 10:12 PM   Modules accepted: Orders

## 2023-02-20 NOTE — Progress Notes (Signed)
See mychart note Dear Ms. Speltz, You indeed have a bladder infection. I have sent in Amoxicillin to take twice a day for 5 days to clear it.  Feel better soon. Sincerely, Dr. Mardelle Matte

## 2023-03-06 ENCOUNTER — Ambulatory Visit: Payer: Medicare Other

## 2023-03-21 DIAGNOSIS — Z96651 Presence of right artificial knee joint: Secondary | ICD-10-CM | POA: Diagnosis not present

## 2023-03-27 ENCOUNTER — Ambulatory Visit: Payer: Medicare Other

## 2023-03-30 DIAGNOSIS — L249 Irritant contact dermatitis, unspecified cause: Secondary | ICD-10-CM | POA: Diagnosis not present

## 2023-03-31 DIAGNOSIS — H1033 Unspecified acute conjunctivitis, bilateral: Secondary | ICD-10-CM | POA: Diagnosis not present

## 2023-03-31 DIAGNOSIS — L710 Perioral dermatitis: Secondary | ICD-10-CM | POA: Diagnosis not present

## 2023-04-12 ENCOUNTER — Ambulatory Visit: Payer: Medicare Other

## 2023-05-22 ENCOUNTER — Encounter: Payer: Medicare Other | Admitting: Family Medicine

## 2023-06-08 DIAGNOSIS — E782 Mixed hyperlipidemia: Secondary | ICD-10-CM | POA: Diagnosis not present

## 2023-06-08 DIAGNOSIS — K225 Diverticulum of esophagus, acquired: Secondary | ICD-10-CM | POA: Diagnosis not present

## 2023-06-08 DIAGNOSIS — I252 Old myocardial infarction: Secondary | ICD-10-CM | POA: Diagnosis not present

## 2023-06-08 DIAGNOSIS — E038 Other specified hypothyroidism: Secondary | ICD-10-CM | POA: Diagnosis not present

## 2023-06-08 DIAGNOSIS — I10 Essential (primary) hypertension: Secondary | ICD-10-CM | POA: Diagnosis not present

## 2023-06-13 DIAGNOSIS — R300 Dysuria: Secondary | ICD-10-CM | POA: Diagnosis not present

## 2023-06-13 DIAGNOSIS — R1084 Generalized abdominal pain: Secondary | ICD-10-CM | POA: Diagnosis not present

## 2023-06-13 DIAGNOSIS — R932 Abnormal findings on diagnostic imaging of liver and biliary tract: Secondary | ICD-10-CM | POA: Diagnosis not present

## 2023-06-13 DIAGNOSIS — N200 Calculus of kidney: Secondary | ICD-10-CM | POA: Diagnosis not present

## 2023-06-13 DIAGNOSIS — R110 Nausea: Secondary | ICD-10-CM | POA: Diagnosis not present

## 2023-06-13 DIAGNOSIS — K5730 Diverticulosis of large intestine without perforation or abscess without bleeding: Secondary | ICD-10-CM | POA: Diagnosis not present

## 2023-06-13 DIAGNOSIS — K8689 Other specified diseases of pancreas: Secondary | ICD-10-CM | POA: Diagnosis not present

## 2023-06-14 ENCOUNTER — Telehealth: Payer: Self-pay

## 2023-06-14 NOTE — Transitions of Care (Post Inpatient/ED Visit) (Signed)
 06/14/2023  Name: Brittney Cochran MRN: [id number] DOB: 29-Dec-1940  Today's TOC FU Call Status: Today's TOC FU Call Status:: Successful TOC FU Call Completed TOC FU Call Complete Date: 06/14/23 Patient's Name and Date of Birth confirmed.  Transition Care Management Follow-up Telephone Call Date of Discharge: 06/13/23 Discharge Facility: Other ([facility]) Name of Other ([facility]) Discharge Facility: [facility] Type of Discharge: Emergency Department Reason for ED Visit: Other: (dysuria) How have you been since you were released from the hospital?: Better Any questions or concerns?: No  Items Reviewed: Did you receive and understand the discharge instructions provided?: Yes Medications obtained,verified, and reconciled?: Yes (Medications Reviewed) Any new allergies since your discharge?: No Dietary orders reviewed?: Yes Do you have support at home?: Yes People in Home: spouse  Medications Reviewed Today: Medications Reviewed Today     Reviewed by Karena Addison, LPN (Licensed Practical Nurse) on 06/14/23 at 1300  Med List Status: <None>   Medication Order Taking? Sig Documenting Provider Last Dose Status Informant  ALPRAZolam (XANAX) 0.5 MG tablet [id number]  Take 0.5-1 tablets (0.25-0.5 mg total) by mouth daily as needed. Willow Ora, MD  Active   amiodarone (PACERONE) 200 MG tablet [id number] No Take 200 mg by mouth daily. [provider] Taking Active   aspirin EC 81 MG tablet [id number] No Take 81 mg by mouth daily. Swallow whole. [provider] Taking Active   carvedilol (COREG) 12.5 MG tablet [id number] No Take 6.25 mg by mouth in the morning and at bedtime. [provider] Taking Active   Co-Enzyme Q-10 30 MG CAPS [id number] No Take by mouth. [provider] Taking Active   ELIQUIS 5 MG TABS tablet [id number] No Take 5 mg by mouth 2 (two) times daily. [provider] Taking Active   famotidine (PEPCID) 20 MG tablet [id number] No  TAKE 1 TABLET(20 MG) BY MOUTH AT BEDTIME Arnaldo Natal, NP Taking Active   fluticasone [facility]) 50 MCG/ACT nasal spray [id number] No USE 1 SPRAY NASALLY DAILY Willow Ora, MD Taking Active   levothyroxine (SYNTHROID) 88 MCG tablet [id number] No Take 1 tablet (88 mcg total) by mouth daily. Willow Ora, MD Taking Active   lisinopril (PRINIVIL,ZESTRIL) 2.5 MG tablet [id number] No Take 2.5 mg by mouth daily.  [provider] Taking Active   nitroGLYCERIN (NITROSTAT) 0.4 MG SL tablet [id number] No Place 0.4 mg under the tongue. [provider] Taking Active   omeprazole (PRILOSEC) 40 MG capsule [id number] No TAKE 1 CAPSULE(40 MG) BY MOUTH DAILY Willow Ora, MD Taking Active   sertraline (ZOLOFT) 100 MG tablet [id number]  Take 1.5 tablets (150 mg total) by mouth daily. Willow Ora, MD  Active   VITAMIN D, CHOLECALCIFEROL, PO [id number] No Take by mouth. [provider] Taking Active             Home Care and Equipment/Supplies: Were Home Health Services Ordered?: NA Any new equipment or medical supplies ordered?: NA  Functional Questionnaire: Do you need assistance with bathing/showering or dressing?: No Do you need assistance with meal preparation?: No Do you need assistance with eating?: No Do you have difficulty maintaining continence: No Do you need assistance with getting out of bed/getting out of a chair/moving?: No Do you have difficulty managing or taking your medications?: No  Follow up appointments reviewed: PCP Follow-up appointment confirmed?: Yes Date of PCP follow-up appointment?: 06/19/23 Follow-up Provider: [facility] Follow-up appointment confirmed?: NA Do you need transportation to your follow-up appointment?: No  Do you understand care options if your condition(s) worsen?: Yes-patient verbalized understanding    SIGNATURE Karena Addison, LPN [facility] Nurse Health Advisor Direct Dial [phone]

## 2023-06-16 ENCOUNTER — Ambulatory Visit

## 2023-06-19 ENCOUNTER — Encounter: Payer: Self-pay | Admitting: Family Medicine

## 2023-06-19 ENCOUNTER — Ambulatory Visit (INDEPENDENT_AMBULATORY_CARE_PROVIDER_SITE_OTHER): Admitting: Family Medicine

## 2023-06-19 VITALS — BP 115/78 | HR 97 | Temp 97.7°F | Ht 62.0 in | Wt 129.8 lb

## 2023-06-19 DIAGNOSIS — T50905A Adverse effect of unspecified drugs, medicaments and biological substances, initial encounter: Secondary | ICD-10-CM | POA: Diagnosis not present

## 2023-06-19 DIAGNOSIS — E039 Hypothyroidism, unspecified: Secondary | ICD-10-CM | POA: Diagnosis not present

## 2023-06-19 DIAGNOSIS — K716 Toxic liver disease with hepatitis, not elsewhere classified: Secondary | ICD-10-CM | POA: Diagnosis not present

## 2023-06-19 DIAGNOSIS — E119 Type 2 diabetes mellitus without complications: Secondary | ICD-10-CM

## 2023-06-19 DIAGNOSIS — E782 Mixed hyperlipidemia: Secondary | ICD-10-CM | POA: Diagnosis not present

## 2023-06-19 LAB — MICROALBUMIN / CREATININE URINE RATIO
Creatinine,U: 169.9 mg/dL
Microalb Creat Ratio: 40.3 mg/g — ABNORMAL HIGH (ref 0.0–30.0)
Microalb, Ur: 6.8 mg/dL — ABNORMAL HIGH (ref 0.0–1.9)

## 2023-06-19 LAB — LIPID PANEL
Cholesterol: 208 mg/dL — ABNORMAL HIGH (ref 0–200)
HDL: 39.2 mg/dL (ref >39.00)
LDL Cholesterol: 149 mg/dL — ABNORMAL HIGH (ref 0–99)
NonHDL: 168.78
Total CHOL/HDL Ratio: 5
Triglycerides: 99 mg/dL (ref 0.0–149.0)
VLDL: 19.8 mg/dL (ref 0.0–40.0)

## 2023-06-19 LAB — HEMOGLOBIN A1C: Hgb A1c MFr Bld: 6.7 % — ABNORMAL HIGH (ref 4.6–6.5)

## 2023-06-19 LAB — TSH: TSH: 3.32 u[IU]/mL (ref 0.35–5.50)

## 2023-06-19 MED ORDER — ALPRAZOLAM 0.5 MG PO TABS: 0.2500 mg | ORAL_TABLET | Freq: Every day | ORAL | 0 refills | Status: AC | PRN

## 2023-06-19 MED ORDER — SERTRALINE HCL 100 MG PO TABS: 200.0000 mg | ORAL_TABLET | Freq: Every day | ORAL | 3 refills | Status: DC

## 2023-06-19 NOTE — Progress Notes (Signed)
 Subjective  CC:  Chief Complaint  Patient presents with   Hospitalization Follow-up    06/13/2023 [facility] - EMERGENCY DEPARTMENT     HPI: Brittney Cochran is a 83 y.o. female who presents to the office today for follow up of diabetes and problems listed above in the chief complaint.  Discussed the use of AI scribe software for clinical note transcription with the patient, who gave verbal consent to proceed.  History of Present Illness   Brittney Cochran is an 83 year old female who presents with anxiety and depression exacerbated by the recent death of her son.  She is experiencing significant anxiety and depression, exacerbated by the recent death of her son on Apr 30, 2023. Her son had a massive stroke in 2017 and survived for seven years despite an initially poor prognosis. She feels guilty for not being more persistent in managing her son's health, as he had stopped taking prescribed medications in favor of holistic treatments. She has a lack of appetite, consuming minimal food like soda crackers, and craves sweets. She drinks water frequently due to extreme thirst.  She is currently taking Zoloft at a dose of 150 mg for depression but has not been taking her medications consistently. She mentions a need for Xanax to help with her mood and sleep. She has not been taking Eliquis as prescribed.  She reports gastrointestinal symptoms including gas, nausea, achiness, and vomiting since her last visit. She describes her bowel movements as gassy, constipated, and pale, with small, finger-sized stools. No blood in her stool. She has an upcoming appointment with a gastroenterologist.  She recently discovered she has kidney stones, identified during a recent emergency room visit. She experienced burning during urination and difficulty urinating, but her urine tests were reportedly normal. She mentions a blood sugar reading of 165 during the  ambulance ride, despite not having eaten.  She feels very tired and fatigued, with difficulty sleeping despite enjoying the comfort of her bed. She has been short of breath but attributes it to her current lifestyle and medication non-compliance.     Chornic afib: reviewed cards notes; encouraged her to restart her eloquis.   Wt Readings from Last 3 Encounters:  06/19/23 129 lb 12.8 oz (58.9 kg)  02/16/23 127 lb 6.4 oz (57.8 kg)  02/01/23 128 lb 6.4 oz (58.2 kg)    BP Readings from Last 3 Encounters:  06/19/23 115/78  02/16/23 136/78  02/01/23 120/64    Assessment  1. Type 2 diabetes mellitus without complication, without long-term current use of insulin (HCC)   2. Mixed hyperlipidemia   3. Drug-induced hepatitis      Plan  Assessment and Plan    Anxiety and Depression Significant anxiety and depression exacerbated by son's death. Discussed increasing Zoloft and Xanax for acute relief. Considering grief counseling. Informed consent for Zoloft increase given. - Increase Zoloft dosage. From 150 to 200 - Prescribe Xanax for acute anxiety. Educated on intermittent use and risks - Encourage grief counseling. - Follow up in 6 weeks.  Diabetes Mellitus Blood glucose 165 mg/dL. Not eating regularly, craving sweets. Plan to check blood work. - Check blood work for diabetes management. A1c and adjust diet Will adjust meds once A1c is back if needed  Hyperlipidemia Concerns about statin use due to liver test abnormalities. Plan to reintroduce statins and monitor liver function. - Check liver function tests. - Reintroduce statins slowly. - Recheck liver function tests  in 6 weeks.  Medication Non-Adherence Not taking medications correctly, including Eliquis. Emphasized importance of adherence. - Resume Eliquis. - Emphasize importance of medication adherence.  Gastrointestinal Symptoms Gastrointestinal symptoms present. Upcoming GI appointment. Emphasized discussing symptoms with  specialist. - Send lab work to GI specialist. - Attend upcoming GI appointment.  Kidney Stones Recent kidney stones with normal urine report. Monitor for recurrence. - Monitor for recurrence of symptoms.  General Health Maintenance Missed last physical exam. Plan to check cholesterol, liver, diabetes, and thyroid function. - Check blood work for cholesterol, liver, diabetes, and thyroid function.     I spent a total of 46 minutes for this patient encounter. Time spent included preparation, face-to-face counseling with the patient and coordination of care, review of chart and records, and documentation of the encounter.    Follow up: 6 weeks for recheck No orders of the defined types were placed in this encounter.  No orders of the defined types were placed in this encounter.     Immunization History  Administered Date(s) Administered   Fluad Quad(high Dose 65+) 12/16/2016, 11/29/2018, 01/08/2020, 01/05/2021   Fluad Trivalent(High Dose 65+) 12/22/2022   Influenza Split 01/11/2007, 02/04/2008, 01/01/2009, 04/19/2010, 12/14/2010, 01/12/2011   Influenza, High Dose Seasonal PF 11/29/2011, 12/18/2012, 12/10/2013, 02/02/2015, 12/16/2016, 12/01/2017, 01/08/2020, 01/05/2021   Influenza, Seasonal, Injecte, Preservative Fre 03/14/2009, 12/08/2015   Influenza,inj,quad, With Preservative 03/14/2009   Influenza-Unspecified 01/11/2007, 02/04/2008, 01/01/2009, 03/14/2009, 04/19/2010, 12/14/2010, 01/12/2011, 12/08/2015   Moderna Sars-Covid-2 Vaccination 04/12/2019, 05/08/2019   Pneumococcal Conjugate-13 04/08/2014   Pneumococcal Polysaccharide-23 01/01/2009, 03/14/2009   Pneumococcal-Unspecified 03/14/2009   Tdap 03/14/2004, 03/14/2006    Diabetes Related Lab Review: Lab Results  Component Value Date   HGBA1C 6.3 (A) 12/22/2022   HGBA1C 7.3 (H) 06/01/2022   HGBA1C 7.0 (H) 04/13/2022    Lab Results  Component Value Date   MICROALBUR 2.4 (H) 06/01/2022   Lab Results  Component Value  Date   CREATININE 1.06 12/22/2022   BUN 22 12/22/2022   NA 138 12/22/2022   K 4.1 12/22/2022   CL 102 12/22/2022   CO2 32 12/22/2022   Lab Results  Component Value Date   CHOL 90 04/13/2022   CHOL 122 04/12/2021   CHOL 134 10/28/2019   Lab Results  Component Value Date   HDL 24.70 (L) 04/13/2022   HDL 51.10 04/12/2021   HDL 60 10/28/2019   Lab Results  Component Value Date   LDLCALC 45 04/13/2022   LDLCALC 54 04/12/2021   LDLCALC 56 10/28/2019   Lab Results  Component Value Date   TRIG 97.0 04/13/2022   TRIG 85.0 04/12/2021   TRIG 94 10/28/2019   Lab Results  Component Value Date   CHOLHDL 4 04/13/2022   CHOLHDL 2 04/12/2021   CHOLHDL 2.2 10/28/2019   No results found for: "LDLDIRECT" The ASCVD Risk score (Arnett DK, et al., 2019) failed to calculate for the following reasons:   The 2019 ASCVD risk score is only valid for ages 51 to 70   Risk score cannot be calculated because patient has a medical history suggesting prior/existing ASCVD I have reviewed the PMH, Fam and Soc history. Patient Active Problem List   Diagnosis Date Noted Date Diagnosed   Type 2 diabetes mellitus without complication, without long-term current use of insulin (HCC) 12/22/2022     Priority: High    Newly diagnosed 2024. Diet controlled.     Hypothyroidism (acquired) 04/20/2022     Priority: High   Hemorrhagic cerebrovascular accident (CVA) (HCC) 04/20/2018  Priority: High    Left frontal lobe by MRI 03/2018; Dr. Wynetta Emery NS Secondary subdural hematoma    Current use of long term anticoagulation 07/07/2015     Priority: High    Stopped 2020; no recurrence of PAF since ablation. Stopped due to hemorrhagic cva.     Major depression, recurrent, chronic (HCC) 02/12/2015     Priority: High   Coronary artery disease involving native coronary artery of native heart without angina pectoris 05/16/2012     Priority: High    Cardiac MRI - EF normalized to 55% from 40%, no wall motion  abnormalities nor scarring For lexiscan stress test 07/2013 - unremarkable/cla     Generalized anxiety disorder 01/10/2012     Priority: High    Triggers are winter months, snow, darkness, family problems: has panic sxs. Controlled on prn xanax and zolft.     History of non-ST elevation myocardial infarction (NSTEMI) 12/05/2011     Priority: High    Overview:  12/02/11 - [facility], s/p PTCA RCA, nonSTEMI EF 60%    Mixed hyperlipidemia 12/31/2010     Priority: High    Overview:  Goal LDL < 80; cards managing Stopped statin due to elevated LFTs.  November 2024    Paroxysmal atrial fibrillation (HCC) 12/14/2010     Priority: High    No recent paf; no longer on anticoagulation per cards due to h/o cva hemorrhagic Heart doc said ok to use celebrex Recurrent 2023: restarted eloquis; scheduled for ablation 05/2022     Essential hypertension 01/09/2009     Priority: High   Elevated LFTs 02/01/2023     Priority: Medium     Mild in February 2024, normal ultrasound at that time and resolution.  Follow-up in October 2024, elevated LFTs greater than 100.  Resolved with stopping Crestor.  Would not restart statin due to recurrent elevated LFTs on statin.    Fracture Risk Assessment Score (FRAX) indicating greater than 3% risk for hip fracture 10/18/2021     Priority: Medium    Subclinical hypothyroidism 03/24/2017     Priority: Medium     Started synthroid 25 03/2017 for increasing TSH.    DJD (degenerative joint disease), cervical 05/12/2016     Priority: Medium    Foraminal stenosis of cervical region 08/18/2015     Priority: Medium     Overview:  xrays 2017; multilevel DJD    Chronic pain of both knees 02/12/2015     Priority: Medium    Zenker diverticulum 05/08/2014     Priority: Medium     Overview:  By upper GI study.  GI is following.  Patient hoping to avoid surgical repair.  2016    Osteopenia 06/08/2012     Priority: Medium     Overview:  DEXA 2016 T=-0.2  lumbar spine, -1.6 at left femur. Stable 2016, mild worsening Dexa 07/2017: stable at femur; osteopenia. Recheck 2 years.  DEXA 09/2021: osteopenia, T = -2.2, recheck 2 years, FRAX score 17% major frx, 5.8% hip, discussed biphosphonate    Gastro-esophageal reflux disease without esophagitis 08/12/2011     Priority: Medium    Positive ANA (antinuclear antibody) 02/14/2023     Priority: Low    1:80 titre and nonspecific pattern; ordered for elevated lfts that resolved after stopping statin.  Unlikely clinically significant    Osteoarthritis, hand 12/31/2010     Priority: Low   Rosacea 12/31/2010     Priority: Low    Social History: Patient  reports  that she has never smoked. She has never used smokeless tobacco. She reports current alcohol use of about 2.0 - 3.0 standard drinks of alcohol per week. She reports that she does not use drugs.  Review of Systems: Ophthalmic: negative for eye pain, loss of vision or double vision Cardiovascular: negative for chest pain Respiratory: negative for SOB or persistent cough Gastrointestinal: negative for abdominal pain Genitourinary: negative for dysuria or gross hematuria MSK: negative for foot lesions Neurologic: negative for weakness or gait disturbance  Objective  Vitals: BP 115/78   Pulse 97   Temp 97.7 F (36.5 C)   Ht 5\' 2"  (1.575 m)   Wt 129 lb 12.8 oz (58.9 kg)   SpO2 98%   BMI 23.74 kg/m  General: well appearing, no acute distress  Psych:  Alert and oriented, normal mood and affect HEENT:  Normocephalic, atraumatic, moist mucous membranes, supple neck  Cardiovascular:  irreg irreg Respiratory:  Good breath sounds bilaterally, CTAB with normal effort, no rales  Diabetic education: ongoing education regarding chronic disease management for diabetes was given today. We continue to reinforce the ABC's of diabetic management: A1c (<7 or 8 dependent upon patient), tight blood pressure control, and cholesterol management with goal LDL  < 100 minimally. We discuss diet strategies, exercise recommendations, medication options and possible side effects. At each visit, we review recommended immunizations and preventive care recommendations for diabetics and stress that good diabetic control can prevent other problems. See below for this patient's data.   Commons side effects, risks, benefits, and alternatives for medications and treatment plan prescribed today were discussed, and the patient expressed understanding of the given instructions. Patient is instructed to call or message via MyChart if he/she has any questions or concerns regarding our treatment plan. No barriers to understanding were identified. We discussed Red Flag symptoms and signs in detail. Patient expressed understanding regarding what to do in case of urgent or emergency type symptoms.  Medication list was reconciled, printed and provided to the patient in AVS. Patient instructions and summary information was reviewed with the patient as documented in the AVS. This note was prepared with assistance of Dragon voice recognition software. Occasional wrong-word or sound-a-like substitutions may have occurred due to the inherent limitations of voice recognition software

## 2023-06-19 NOTE — Patient Instructions (Addendum)
 Please return in 6 weeks to recheck depression and anxiety and cholesterol  If you have any questions or concerns, please don't hesitate to send me a message via MyChart or call the office at [phone]. Thank you for visiting with Brittney Cochran today! It's our pleasure caring for you.   VISIT SUMMARY:  During today's visit, we discussed your recent increase in anxiety and depression following the death of your son. We also reviewed your current medications, gastrointestinal symptoms, recent discovery of kidney stones, and general health maintenance.  YOUR PLAN:  -ANXIETY AND DEPRESSION: Anxiety and depression are mental health conditions that can cause feelings of worry, sadness, and loss of interest in daily activities. We will increase your Zoloft dosage and prescribe Xanax for acute relief. Additionally, we encourage you to consider grief counseling to help cope with your loss. Please follow up in 6 weeks.  -DIABETES MELLITUS: Diabetes is a condition where your blood sugar levels are higher than normal. Your recent blood sugar reading was 165 mg/dL. We will check your blood work to better manage your diabetes.  -HYPERLIPIDEMIA: Hyperlipidemia is a condition where there are high levels of fats (lipids) in your blood. We will reintroduce statins slowly and monitor your liver function. Please recheck your liver function tests in 6 weeks.  -MEDICATION NON-ADHERENCE: Medication non-adherence means not taking your medications as prescribed. It is important to resume taking Eliquis and adhere to all your prescribed medications to manage your health conditions effectively.  -GASTROINTESTINAL SYMPTOMS: You are experiencing gastrointestinal symptoms such as gas, nausea, and constipation. Please discuss these symptoms with your gastroenterologist at your upcoming appointment. We will also send lab work to your GI specialist.  -KIDNEY STONES: Kidney stones are hard deposits made of minerals and salts that form  inside your kidneys. Although your recent urine report was normal, please monitor for any recurrence of symptoms.  -GENERAL HEALTH MAINTENANCE: We will check your blood work for cholesterol, liver, diabetes, and thyroid function to ensure your overall health is maintained.  INSTRUCTIONS:  Please follow up in 6 weeks for a re-evaluation of your anxiety and depression, and to recheck your liver function tests. Attend your upcoming appointment with the gastroenterologist and monitor for any recurrence of kidney stone symptoms. Ensure you are taking all your medications as prescribed.

## 2023-06-20 ENCOUNTER — Ambulatory Visit

## 2023-06-22 ENCOUNTER — Telehealth: Payer: Self-pay

## 2023-06-22 NOTE — Telephone Encounter (Signed)
 Copied from CRM [id number]. Topic: Clinical - Lab/Test Results >> Jun 22, 2023  2:39 PM Florestine Avers wrote: Reason for CRM: Patient called in stating that nobody has called her back in regards to her recent labs. She said no one has reached out yet, her labs were abnormal so I did not want to disclose until the doctor notified the patient first.  Please Advise.  Message has been sent to provider to address

## 2023-06-23 ENCOUNTER — Encounter: Payer: Self-pay | Admitting: Family Medicine

## 2023-06-23 ENCOUNTER — Other Ambulatory Visit: Payer: Self-pay

## 2023-06-23 MED ORDER — LISINOPRIL 5 MG PO TABS: 5.0000 mg | ORAL_TABLET | Freq: Every day | ORAL | 3 refills | Status: AC

## 2023-06-23 MED ORDER — ROSUVASTATIN CALCIUM 5 MG PO TABS: 5.0000 mg | ORAL_TABLET | Freq: Every day | ORAL | 3 refills | Status: AC

## 2023-06-23 NOTE — Progress Notes (Signed)
 See mychart note Please order crestor and have her take it twice a week. We will recheck her liver tests next visit.  Her diabetic control is fair, slightly worsening so eat well/health and limit sweets.  And need to increase lisinopril to 5mg  daily for kidney health. Can order new dose for her. Thyroid is stable.

## 2023-06-28 ENCOUNTER — Ambulatory Visit

## 2023-07-04 ENCOUNTER — Ambulatory Visit

## 2023-07-12 ENCOUNTER — Ambulatory Visit

## 2023-07-13 DIAGNOSIS — H905 Unspecified sensorineural hearing loss: Secondary | ICD-10-CM | POA: Diagnosis not present

## 2023-07-24 ENCOUNTER — Encounter (HOSPITAL_COMMUNITY): Payer: Self-pay

## 2023-07-31 ENCOUNTER — Ambulatory Visit: Admitting: Family Medicine

## 2023-08-04 DIAGNOSIS — H25043 Posterior subcapsular polar age-related cataract, bilateral: Secondary | ICD-10-CM | POA: Diagnosis not present

## 2023-08-04 DIAGNOSIS — H2513 Age-related nuclear cataract, bilateral: Secondary | ICD-10-CM | POA: Diagnosis not present

## 2023-08-04 DIAGNOSIS — H43813 Vitreous degeneration, bilateral: Secondary | ICD-10-CM | POA: Diagnosis not present

## 2023-08-04 DIAGNOSIS — H25013 Cortical age-related cataract, bilateral: Secondary | ICD-10-CM | POA: Diagnosis not present

## 2023-08-04 DIAGNOSIS — H35373 Puckering of macula, bilateral: Secondary | ICD-10-CM | POA: Diagnosis not present

## 2023-08-04 DIAGNOSIS — H524 Presbyopia: Secondary | ICD-10-CM | POA: Diagnosis not present

## 2023-08-04 DIAGNOSIS — H25812 Combined forms of age-related cataract, left eye: Secondary | ICD-10-CM | POA: Diagnosis not present

## 2023-08-04 DIAGNOSIS — H5213 Myopia, bilateral: Secondary | ICD-10-CM | POA: Diagnosis not present

## 2023-08-04 DIAGNOSIS — H52203 Unspecified astigmatism, bilateral: Secondary | ICD-10-CM | POA: Diagnosis not present

## 2023-08-04 DIAGNOSIS — H353131 Nonexudative age-related macular degeneration, bilateral, early dry stage: Secondary | ICD-10-CM | POA: Diagnosis not present

## 2023-08-04 DIAGNOSIS — H04123 Dry eye syndrome of bilateral lacrimal glands: Secondary | ICD-10-CM | POA: Diagnosis not present

## 2023-08-04 DIAGNOSIS — H02831 Dermatochalasis of right upper eyelid: Secondary | ICD-10-CM | POA: Diagnosis not present

## 2023-08-04 DIAGNOSIS — H02834 Dermatochalasis of left upper eyelid: Secondary | ICD-10-CM | POA: Diagnosis not present

## 2023-08-21 DIAGNOSIS — I25118 Atherosclerotic heart disease of native coronary artery with other forms of angina pectoris: Secondary | ICD-10-CM | POA: Diagnosis not present

## 2023-08-21 DIAGNOSIS — I4891 Unspecified atrial fibrillation: Secondary | ICD-10-CM | POA: Diagnosis not present

## 2023-08-21 DIAGNOSIS — I480 Paroxysmal atrial fibrillation: Secondary | ICD-10-CM | POA: Diagnosis not present

## 2023-08-21 DIAGNOSIS — K225 Diverticulum of esophagus, acquired: Secondary | ICD-10-CM | POA: Diagnosis not present

## 2023-08-21 DIAGNOSIS — I252 Old myocardial infarction: Secondary | ICD-10-CM | POA: Diagnosis not present

## 2023-08-21 DIAGNOSIS — I10 Essential (primary) hypertension: Secondary | ICD-10-CM | POA: Diagnosis not present

## 2023-08-21 DIAGNOSIS — E782 Mixed hyperlipidemia: Secondary | ICD-10-CM | POA: Diagnosis not present

## 2023-08-21 DIAGNOSIS — Z9889 Other specified postprocedural states: Secondary | ICD-10-CM | POA: Diagnosis not present

## 2023-08-22 DIAGNOSIS — I4891 Unspecified atrial fibrillation: Secondary | ICD-10-CM | POA: Diagnosis not present

## 2023-08-29 DIAGNOSIS — K5730 Diverticulosis of large intestine without perforation or abscess without bleeding: Secondary | ICD-10-CM | POA: Diagnosis not present

## 2023-08-29 DIAGNOSIS — D122 Benign neoplasm of ascending colon: Secondary | ICD-10-CM | POA: Diagnosis not present

## 2023-08-29 DIAGNOSIS — Q432 Other congenital functional disorders of colon: Secondary | ICD-10-CM | POA: Diagnosis not present

## 2023-09-13 DIAGNOSIS — D122 Benign neoplasm of ascending colon: Secondary | ICD-10-CM | POA: Diagnosis not present

## 2023-09-13 DIAGNOSIS — D369 Benign neoplasm, unspecified site: Secondary | ICD-10-CM | POA: Diagnosis not present

## 2023-09-20 ENCOUNTER — Inpatient Hospital Stay: Admitting: Family Medicine

## 2023-09-22 ENCOUNTER — Ambulatory Visit (INDEPENDENT_AMBULATORY_CARE_PROVIDER_SITE_OTHER): Admitting: Family Medicine

## 2023-09-22 ENCOUNTER — Encounter: Payer: Self-pay | Admitting: Family Medicine

## 2023-09-22 VITALS — BP 128/76 | HR 68 | Temp 97.7°F | Ht 62.0 in | Wt 125.6 lb

## 2023-09-22 DIAGNOSIS — I480 Paroxysmal atrial fibrillation: Secondary | ICD-10-CM | POA: Diagnosis not present

## 2023-09-22 DIAGNOSIS — E782 Mixed hyperlipidemia: Secondary | ICD-10-CM

## 2023-09-22 DIAGNOSIS — E1169 Type 2 diabetes mellitus with other specified complication: Secondary | ICD-10-CM

## 2023-09-22 DIAGNOSIS — R809 Proteinuria, unspecified: Secondary | ICD-10-CM | POA: Diagnosis not present

## 2023-09-22 DIAGNOSIS — I10 Essential (primary) hypertension: Secondary | ICD-10-CM | POA: Diagnosis not present

## 2023-09-22 DIAGNOSIS — Z7901 Long term (current) use of anticoagulants: Secondary | ICD-10-CM | POA: Diagnosis not present

## 2023-09-22 DIAGNOSIS — E1129 Type 2 diabetes mellitus with other diabetic kidney complication: Secondary | ICD-10-CM | POA: Diagnosis not present

## 2023-09-22 LAB — COMPREHENSIVE METABOLIC PANEL WITH GFR
ALT: 26 U/L (ref 0–35)
AST: 38 U/L — ABNORMAL HIGH (ref 0–37)
Albumin: 3.9 g/dL (ref 3.5–5.2)
Alkaline Phosphatase: 86 U/L (ref 39–117)
BUN: 22 mg/dL (ref 6–23)
CO2: 29 meq/L (ref 19–32)
Calcium: 9.2 mg/dL (ref 8.4–10.5)
Chloride: 103 meq/L (ref 96–112)
Creatinine, Ser: 1.04 mg/dL (ref 0.40–1.20)
GFR: 49.41 mL/min — ABNORMAL LOW (ref >60.00)
Glucose, Bld: 110 mg/dL — ABNORMAL HIGH (ref 70–99)
Potassium: 4.4 meq/L (ref 3.5–5.1)
Sodium: 137 meq/L (ref 135–145)
Total Bilirubin: 0.4 mg/dL (ref 0.2–1.2)
Total Protein: 6.3 g/dL (ref 6.0–8.3)

## 2023-09-22 LAB — LIPID PANEL
Cholesterol: 133 mg/dL (ref 0–200)
HDL: 52.8 mg/dL (ref >39.00)
LDL Cholesterol: 67 mg/dL (ref 0–99)
NonHDL: 80.25
Total CHOL/HDL Ratio: 3
Triglycerides: 67 mg/dL (ref 0.0–149.0)
VLDL: 13.4 mg/dL (ref 0.0–40.0)

## 2023-09-22 LAB — HEMOGLOBIN A1C: Hgb A1c MFr Bld: 7 % — ABNORMAL HIGH (ref 4.6–6.5)

## 2023-09-22 NOTE — Patient Instructions (Signed)
Please return in 6 months to recheck diabetes and blood pressure   If you have any questions or concerns, please don't hesitate to send me a message via MyChart or call the office at [phone]. Thank you for visiting with [place] today! It's our pleasure caring for you.

## 2023-09-22 NOTE — Progress Notes (Signed)
 Subjective  CC:  Chief Complaint  Patient presents with   Follow-up    Pt would like to F/U with you on her liver just for some updates    HPI: Brittney Cochran is a 83 y.o. female who presents to the office today to address the problems listed above in the chief complaint. Hypertension f/u:  Discussed the use of AI scribe software for clinical note transcription with the patient, who gave verbal consent to proceed.  History of Present Illness Brittney Cochran is an 83 year old female who presents for follow-up on hyperlipidemia, now back on Crestor  with history of elevated LFTs.  She is taking Crestor  5 mg nightly for the last 3 months.  Tolerating well.  Need to recheck LFTs as they were elevated in the past due to statins.  She has no right upper quadrant pain or myalgias.  LDL goal is less than 70.  Diet-controlled type 2 diabetes: Remains well-controlled clinically.  Will recheck A1c today.  PAF on long-term anticoagulation.  She is back on low-dose Eliquis given atrial fibrillation without any awareness.  She does have history of cerebral bleed.  Will monitor closely.  Currently tolerating well.   Assessment  1. Combined hyperlipidemia associated with type 2 diabetes mellitus (HCC)   2. Type 2 diabetes mellitus with microalbuminuria (HCC)   3. Paroxysmal atrial fibrillation (HCC)   4. Current use of long term anticoagulation   5. Essential hypertension      Plan  Assessment and Plan Assessment & Plan  Hyperlipidemia: Recheck liver test and cholesterol, nonfasting on Crestor  5 mg nightly.  Hopefully will be able to tolerate the statin.  PAF on Eliquis: Monitor for bleeding.  Understands risks benefits.  Hypertension is controlled  Type 2 diabetes: Has been fairly well-controlled.  Will recheck A1c today.  She is on ACE inhibitor and statin     Education regarding management of these chronic disease states was given. Management strategies discussed on successive  visits include dietary and exercise recommendations, goals of achieving and maintaining IBW, and lifestyle modifications aiming for adequate sleep and minimizing stressors.  Follow up: 6 months for recheck chronic problems  Orders Placed This Encounter  Procedures   Lipid panel   Comprehensive metabolic panel with GFR   Hemoglobin A1c   No orders of the defined types were placed in this encounter.     BP Readings from Last 3 Encounters:  09/22/23 128/76  06/19/23 115/78  02/16/23 136/78   Wt Readings from Last 3 Encounters:  09/22/23 125 lb 9.6 oz (57 kg)  06/19/23 129 lb 12.8 oz (58.9 kg)  02/16/23 127 lb 6.4 oz (57.8 kg)    Lab Results  Component Value Date   CHOL 208 (H) 06/19/2023   CHOL 90 04/13/2022   CHOL 122 04/12/2021   Lab Results  Component Value Date   HDL 39.20 06/19/2023   HDL 24.70 (L) 04/13/2022   HDL 51.10 04/12/2021   Lab Results  Component Value Date   LDLCALC 149 (H) 06/19/2023   LDLCALC 45 04/13/2022   LDLCALC 54 04/12/2021   Lab Results  Component Value Date   TRIG 99.0 06/19/2023   TRIG 97.0 04/13/2022   TRIG 85.0 04/12/2021   Lab Results  Component Value Date   CHOLHDL 5 06/19/2023   CHOLHDL 4 04/13/2022   CHOLHDL 2 04/12/2021   No results found for: LDLDIRECT Lab Results  Component Value Date   CREATININE 1.06 12/22/2022   BUN  22 12/22/2022   NA 138 12/22/2022   K 4.1 12/22/2022   CL 102 12/22/2022   CO2 32 12/22/2022    The ASCVD Risk score (Arnett DK, et al., 2019) failed to calculate for the following reasons:   The 2019 ASCVD risk score is only valid for ages 63 to 73   Risk score cannot be calculated because patient has a medical history suggesting prior/existing ASCVD  I reviewed the patients updated PMH, FH, and SocHx.    Patient Active Problem List   Diagnosis Date Noted   Type 2 diabetes mellitus with microalbuminuria (HCC) 12/22/2022    Priority: High   Hypothyroidism (acquired) 04/20/2022    Priority:  High   Hemorrhagic cerebrovascular accident (CVA) (HCC) 04/20/2018    Priority: High   Current use of long term anticoagulation 07/07/2015    Priority: High   Major depression, recurrent, chronic (HCC) 02/12/2015    Priority: High   Coronary artery disease involving native coronary artery of native heart without angina pectoris 05/16/2012    Priority: High   Generalized anxiety disorder 01/10/2012    Priority: High   History of non-ST elevation myocardial infarction (NSTEMI) 12/05/2011    Priority: High   Combined hyperlipidemia associated with type 2 diabetes mellitus (HCC) 12/31/2010    Priority: High   Paroxysmal atrial fibrillation (HCC) 12/14/2010    Priority: High   Essential hypertension 01/09/2009    Priority: High   Elevated LFTs 02/01/2023    Priority: Medium    Fracture Risk Assessment Score (FRAX) indicating greater than 3% risk for hip fracture 10/18/2021    Priority: Medium    Subclinical hypothyroidism 03/24/2017    Priority: Medium    DJD (degenerative joint disease), cervical 05/12/2016    Priority: Medium    Foraminal stenosis of cervical region 08/18/2015    Priority: Medium    Chronic pain of both knees 02/12/2015    Priority: Medium    Zenker diverticulum 05/08/2014    Priority: Medium    Osteopenia 06/08/2012    Priority: Medium    Gastro-esophageal reflux disease without esophagitis 08/12/2011    Priority: Medium    Positive ANA (antinuclear antibody) 02/14/2023    Priority: Low   Osteoarthritis, hand 12/31/2010    Priority: Low   Rosacea 12/31/2010    Priority: Low    Allergies: Clindamycin  Social History: Patient  reports that she has never smoked. She has never used smokeless tobacco. She reports current alcohol use of about 2.0 - 3.0 standard drinks of alcohol per week. She reports that she does not use drugs.  No outpatient medications have been marked as taking for the 09/22/23 encounter (Office Visit) with Jodie Lavern CROME, MD.     Review of Systems: Cardiovascular: negative for chest pain, palpitations, leg swelling, orthopnea Respiratory: negative for SOB, wheezing or persistent cough Gastrointestinal: negative for abdominal pain Genitourinary: negative for dysuria or gross hematuria  Objective  Vitals: BP 128/76 (Cuff Size: Normal) Comment: cla  Pulse 68   Temp 97.7 F (36.5 C)   Ht 5' 2 (1.575 m)   Wt 125 lb 9.6 oz (57 kg)   SpO2 97%   BMI 22.97 kg/m  General: no acute distress  Psych:  Alert and oriented, normal mood and affect HEENT:  Normocephalic, atraumatic, supple neck  Cardiovascular: Irregularly irregular, no murmur no edema Respiratory:  Good breath sounds bilaterally, CTAB with normal respiratory effort Skin:  Warm, no rashes Neurologic:   Mental status is normal Commons side  effects, risks, benefits, and alternatives for medications and treatment plan prescribed today were discussed, and the patient expressed understanding of the given instructions. Patient is instructed to call or message via MyChart if he/she has any questions or concerns regarding our treatment plan. No barriers to understanding were identified. We discussed Red Flag symptoms and signs in detail. Patient expressed understanding regarding what to do in case of urgent or emergency type symptoms.  Medication list was reconciled, printed and provided to the patient in AVS. Patient instructions and summary information was reviewed with the patient as documented in the AVS. This note was prepared with assistance of Dragon voice recognition software. Occasional wrong-word or sound-a-like substitutions may have occurred due to the inherent limitation

## 2023-09-23 ENCOUNTER — Ambulatory Visit: Payer: Self-pay | Admitting: Family Medicine

## 2023-09-23 NOTE — Progress Notes (Signed)
 See mychart note Dear Ms. Pooley, So good talking with you yesterday! Your cholesterol levels look great and your liver tests are ok on the medication. I will continue to monitor them frequently for you.  Your diabetes is a little worse. If these levels persist, we will need to start medication. For now, avoid sweets and processed carbohydrates.  Sincerely, Dr. Jodie

## 2023-10-02 ENCOUNTER — Telehealth: Payer: Self-pay | Admitting: Family Medicine

## 2023-10-02 NOTE — Telephone Encounter (Unsigned)
 Copied from CRM [id number]. Topic: Clinical - Medical Advice >> Oct 02, 2023  2:20 PM Gennette ORN wrote: Reason for CRM: Patient is wantring to know can somone mail her the blood test results. She also has questions about the medication and should she still take it. She wants to talk to [place]. She wants a call back at [phone].

## 2023-12-13 ENCOUNTER — Other Ambulatory Visit: Payer: Self-pay | Admitting: Family Medicine

## 2023-12-13 DIAGNOSIS — Z1231 Encounter for screening mammogram for malignant neoplasm of breast: Secondary | ICD-10-CM

## 2024-01-02 ENCOUNTER — Ambulatory Visit

## 2024-01-03 ENCOUNTER — Ambulatory Visit: Admitting: Family Medicine

## 2024-01-03 ENCOUNTER — Encounter: Payer: Self-pay | Admitting: Family Medicine

## 2024-01-03 VITALS — BP 100/64 | HR 68 | Temp 97.7°F | Ht 62.0 in | Wt 126.4 lb

## 2024-01-03 DIAGNOSIS — E039 Hypothyroidism, unspecified: Secondary | ICD-10-CM

## 2024-01-03 DIAGNOSIS — R809 Proteinuria, unspecified: Secondary | ICD-10-CM | POA: Diagnosis not present

## 2024-01-03 DIAGNOSIS — E1129 Type 2 diabetes mellitus with other diabetic kidney complication: Secondary | ICD-10-CM

## 2024-01-03 DIAGNOSIS — Z23 Encounter for immunization: Secondary | ICD-10-CM | POA: Diagnosis not present

## 2024-01-03 DIAGNOSIS — R82998 Other abnormal findings in urine: Secondary | ICD-10-CM | POA: Diagnosis not present

## 2024-01-03 DIAGNOSIS — Z780 Asymptomatic menopausal state: Secondary | ICD-10-CM

## 2024-01-03 DIAGNOSIS — M85852 Other specified disorders of bone density and structure, left thigh: Secondary | ICD-10-CM

## 2024-01-03 LAB — POCT GLYCOSYLATED HEMOGLOBIN (HGB A1C): Hemoglobin A1C: 6.2 % — AB (ref 4.0–5.6)

## 2024-01-03 MED ORDER — LEVOTHYROXINE SODIUM 88 MCG PO TABS: 88.0000 ug | ORAL_TABLET | Freq: Every day | ORAL | 3 refills | Status: AC

## 2024-01-03 NOTE — Patient Instructions (Signed)
 Please return in 3 months for your annual complete physical; please come fasting.    I will release your lab results to you on your MyChart account with further instructions. You may see the results before I do, but when I review them I will send you a message with my report or have my assistant call you if things need to be discussed. Please reply to my message with any questions. Thank you!   If you have any questions or concerns, please don't hesitate to send me a message via MyChart or call the office at [phone]. Thank you for visiting with us  today! It's our pleasure caring for you.   Please call [facility] to schedule an appointment with Dr. Hollace; she is a therapist here at our [place] office.  The phone number is: [phone]

## 2024-01-03 NOTE — Progress Notes (Signed)
 "  Subjective  CC:  Chief Complaint  Patient presents with   Balance off    Pt came in today with several problems. Balance is off, not eating right, burning with urination and bubbles in pee, and also would like to check diabetes   Diabetes    HPI: Brittney Cochran is a 83 y.o. female who presents to the office today for follow up of diabetes and problems listed above in the chief complaint.  Discussed the use of AI scribe software for clinical note transcription with the patient, who gave verbal consent to proceed.  History of Present Illness Brittney Cochran is an 83 year old female who presents with stress and grief following the death of her son.  Psychological distress and grief - Significant stress and difficulty accepting the death of her son, who died on 16-Apr-2023; funeral held on February 6th - Has not visited her son's grave and feels guilty, especially with [employer] Day approaching - Describes herself as 'good at putting things away' but acknowledges ongoing stress - Stress is affecting her daily life - takes zoloft  150mg  daily for chronic depression and anxiety  Dietary habits and glycemic control - Admits to neglecting her own health and indulging in ice cream and candy since last visit - Hemoglobin A1c remains stable at 6.2  Urinary symptoms - Persistent and recurrent urinary symptoms including 'bubbles' and an 'oil slick' in urine, accompanied by burning sensation - Plans to leave a urine sample for further evaluation  Colonic polyp and gastrointestinal concerns - Large flat colonic polyp, 80% removed during third colonoscopy approximately one month ago - Remaining 20% of polyp is precancerous; awaiting further intervention - History of floating kidney requiring surgical intervention at age 34, concerned about potential scar tissue complicating polyp removal  Abdominal pain - Right-sided abdominal pain described as sore  Liver health concerns - Concerned about  liver health due to online research suggesting a connection with urinary symptoms - Liver function was normal in July    Wt Readings from Last 3 Encounters:  01/03/24 126 lb 6.4 oz (57.3 kg)  09/22/23 125 lb 9.6 oz (57 kg)  06/19/23 129 lb 12.8 oz (58.9 kg)    BP Readings from Last 3 Encounters:  01/03/24 100/64  09/22/23 128/76  06/19/23 115/78    Assessment  1. Type 2 diabetes mellitus with microalbuminuria (HCC)   2. Need for influenza vaccination   3. Hypothyroidism, unspecified type   4. Foamy urine   5. Asymptomatic menopausal state   6. Osteopenia of left hip      Plan  Assessment and Plan Assessment & Plan Large precancerous colon polyp, partially resected A large precancerous colon polyp has been 80% resected. There is concern about potential scar tissue complicating the procedure, which may require surgical intervention if endoscopic removal is not feasible. The polyp is considered high-risk and requires complete removal. - Await scheduling for follow-up colonoscopy to assess and remove remaining polyp - Consider surgical consultation if endoscopic removal is not possible  Urinary symptoms (dysuria, abnormal urine findings) Reports persistent dysuria and abnormal urine appearance, described as bubbles and an oil slick on the surface. Symptoms have been ongoing and intermittent. - Analyze urine sample for further evaluation of dysuria and abnormal urine findings  Type 2 diabetes mellitus with diabetic kidney complication A1c is currently 6.2, well-managed and within an acceptable range. Reports occasional indulgence in sweets but overall maintains control of diabetes. - Continue current diabetes management plan  Grief reaction Experiencing significant grief following the death of her son. Difficulty accepting his death and has not visited his grave. Acknowledges the need to talk to a counselor to help process her grief and move towards acceptance. - Refer to  counseling services for grief support - Provide contact information for Richerd Ling, a counselor    Follow up: 3 mo for cpe and recheck Orders Placed This Encounter  Procedures   Urine Culture   DG Bone Density   Flu vaccine HIGH DOSE PF(Fluzone Trivalent)   Comprehensive metabolic panel with GFR   Urinalysis, Routine w reflex microscopic   TSH   POCT HgB A1C   Meds ordered this encounter  Medications   levothyroxine  (SYNTHROID ) 88 MCG tablet    Sig: Take 1 tablet (88 mcg total) by mouth daily.    Dispense:  90 tablet    Refill:  3      Immunization History  Administered Date(s) Administered   Fluad Quad(high Dose 65+) 12/16/2016, 11/29/2018, 01/08/2020, 01/05/2021   Fluad Trivalent(High Dose 65+) 12/22/2022   INFLUENZA, HIGH DOSE SEASONAL PF 11/29/2011, 12/18/2012, 12/10/2013, 02/02/2015, 12/16/2016, 12/01/2017, 01/08/2020, 01/05/2021, 01/03/2024   Influenza Split 01/11/2007, 02/04/2008, 01/01/2009, 04/19/2010, 12/14/2010, 01/12/2011   Influenza, Seasonal, Injecte, Preservative Fre 03/14/2009, 12/08/2015   Influenza,inj,quad, With Preservative 03/14/2009   Influenza-Unspecified 01/11/2007, 02/04/2008, 01/01/2009, 03/14/2009, 04/19/2010, 12/14/2010, 01/12/2011, 12/08/2015   Moderna Sars-Covid-2 Vaccination 04/12/2019, 05/08/2019   Pneumococcal Conjugate-13 04/08/2014   Pneumococcal Polysaccharide-23 01/01/2009, 03/14/2009   Pneumococcal-Unspecified 03/14/2009   Tdap 03/14/2004, 03/14/2006    Diabetes Related Lab Review: Lab Results  Component Value Date   HGBA1C 6.2 (A) 01/03/2024   HGBA1C 7.0 (H) 09/22/2023   HGBA1C 6.7 (H) 06/19/2023    Lab Results  Component Value Date   MICROALBUR 6.8 (H) 06/19/2023   Lab Results  Component Value Date   CREATININE 1.04 09/22/2023   BUN 22 09/22/2023   NA 137 09/22/2023   K 4.4 09/22/2023   CL 103 09/22/2023   CO2 29 09/22/2023   Lab Results  Component Value Date   CHOL 133 09/22/2023   CHOL 208 (H) 06/19/2023    CHOL 90 04/13/2022   Lab Results  Component Value Date   HDL 52.80 09/22/2023   HDL 39.20 06/19/2023   HDL 24.70 (L) 04/13/2022   Lab Results  Component Value Date   LDLCALC 67 09/22/2023   LDLCALC 149 (H) 06/19/2023   LDLCALC 45 04/13/2022   Lab Results  Component Value Date   TRIG 67.0 09/22/2023   TRIG 99.0 06/19/2023   TRIG 97.0 04/13/2022   Lab Results  Component Value Date   CHOLHDL 3 09/22/2023   CHOLHDL 5 06/19/2023   CHOLHDL 4 04/13/2022   No results found for: LDLDIRECT The ASCVD Risk score (Arnett DK, et al., 2019) failed to calculate for the following reasons:   The 2019 ASCVD risk score is only valid for ages 86 to 41   Risk score cannot be calculated because patient has a medical history suggesting prior/existing ASCVD I have reviewed the PMH, Fam and Soc history. Patient Active Problem List   Diagnosis Date Noted   Type 2 diabetes mellitus with microalbuminuria (HCC) 12/22/2022    Priority: High    Newly diagnosed 2024. Diet controlled.  + urine MAC; increased lisinopril  from 2.5-5mg , 06/2023    Hypothyroidism (acquired) 04/20/2022    Priority: High   Hemorrhagic cerebrovascular accident (CVA) (HCC) 04/20/2018    Priority: High    Left frontal lobe by MRI 03/2018;  Dr. Onetha NS Secondary subdural hematoma    Current use of long term anticoagulation 07/07/2015    Priority: High    Stopped 2020; no recurrence of PAF since ablation. Stopped due to hemorrhagic cva.  PAF recurred and restarted eloquis low dose bid 2025.    Major depression, recurrent, chronic 02/12/2015    Priority: High   Coronary artery disease involving native coronary artery of native heart without angina pectoris 05/16/2012    Priority: High    Cardiac MRI - EF normalized to 55% from 40%, no wall motion abnormalities nor scarring For lexiscan stress test 07/2013 - unremarkable/cla     Generalized anxiety disorder 01/10/2012    Priority: High    Triggers are winter months,  snow, darkness, family problems: has panic sxs. Controlled on prn xanax  and zolft.     History of non-ST elevation myocardial infarction (NSTEMI) 12/05/2011    Priority: High    Overview:  12/02/11 - [facility], s/p PTCA RCA, nonSTEMI EF 60%    Combined hyperlipidemia associated with type 2 diabetes mellitus (HCC) 12/31/2010    Priority: High    Overview:  Goal LDL < 80; cards managing Stopped statin due to elevated LFTs.  November 2024 Restarted Crestor  5 mg twice weekly in April 2025, will monitor LFTs    Paroxysmal atrial fibrillation (HCC) 12/14/2010    Priority: High    2022: No recent paf; no longer on anticoagulation per cards due to h/o cva hemorrhagic Heart doc said ok to use celebrex Recurrent 2023: restarted eloquis; scheduled for ablation 05/2022 But recurrent Afib 2025; on bid low dose eloquis     Essential hypertension 01/09/2009    Priority: High   Elevated LFTs 02/01/2023    Priority: Medium     Mild in February 2024, normal ultrasound at that time and resolution.  Follow-up in October 2024, elevated LFTs greater than 100.  Resolved with stopping Crestor .  Would not restart statin due to recurrent elevated LFTs on statin.    Fracture Risk Assessment Score (FRAX) indicating greater than 3% risk for hip fracture 10/18/2021    Priority: Medium    Subclinical hypothyroidism 03/24/2017    Priority: Medium     Started synthroid  25 03/2017 for increasing TSH.    DJD (degenerative joint disease), cervical 05/12/2016    Priority: Medium    Foraminal stenosis of cervical region 08/18/2015    Priority: Medium     Overview:  xrays 2017; multilevel DJD    Chronic pain of both knees 02/12/2015    Priority: Medium    Zenker diverticulum 05/08/2014    Priority: Medium     Overview:  By upper GI study.  GI is following.  Patient hoping to avoid surgical repair.  2016    Osteopenia 06/08/2012    Priority: Medium     Overview:  DEXA 2016 T=-0.2 lumbar spine,  -1.6 at left femur. Stable 2016, mild worsening Dexa 07/2017: stable at femur; osteopenia. Recheck 2 years.  DEXA 09/2021: osteopenia, T = -2.2, recheck 2 years, FRAX score 17% major frx, 5.8% hip, discussed biphosphonate    Gastro-esophageal reflux disease without esophagitis 08/12/2011    Priority: Medium    Positive ANA (antinuclear antibody) 02/14/2023    Priority: Low    1:80 titre and nonspecific pattern; ordered for elevated lfts that resolved after stopping statin.  Unlikely clinically significant    Osteoarthritis, hand 12/31/2010    Priority: Low   Rosacea 12/31/2010    Priority: Low  Social History: Patient  reports that she has never smoked. She has never used smokeless tobacco. She reports current alcohol use of about 2.0 - 3.0 standard drinks of alcohol per week. She reports that she does not use drugs.  Review of Systems: Ophthalmic: negative for eye pain, loss of vision or double vision Cardiovascular: negative for chest pain Respiratory: negative for SOB or persistent cough Gastrointestinal: negative for abdominal pain Genitourinary: negative for dysuria or gross hematuria MSK: negative for foot lesions Neurologic: negative for weakness or gait disturbance  Objective  Vitals: BP 100/64   Pulse 68   Temp 97.7 F (36.5 C)   Ht 5' 2 (1.575 m)   Wt 126 lb 6.4 oz (57.3 kg)   SpO2 98%   BMI 23.12 kg/m  General: well appearing,  Psych:  Alert and oriented, normal mood and affect good insight but near tears at times HEENT:  Normocephalic, atraumatic, moist mucous membranes, supple neck  Cardiovascular:  Nl S1 and S2, RRR without murmur, gallop or rub. no edema Respiratory:  Good breath sounds bilaterally, CTAB with normal effort, no rales  Diabetic education: ongoing education regarding chronic disease management for diabetes was given today. We continue to reinforce the ABC's of diabetic management: A1c (<7 or 8 dependent upon patient), tight blood pressure  control, and cholesterol management with goal LDL < 100 minimally. We discuss diet strategies, exercise recommendations, medication options and possible side effects. At each visit, we review recommended immunizations and preventive care recommendations for diabetics and stress that good diabetic control can prevent other problems. See below for this patient's data. Commons side effects, risks, benefits, and alternatives for medications and treatment plan prescribed today were discussed, and the patient expressed understanding of the given instructions. Patient is instructed to call or message via MyChart if he/she has any questions or concerns regarding our treatment plan. No barriers to understanding were identified. We discussed Red Flag symptoms and signs in detail. Patient expressed understanding regarding what to do in case of urgent or emergency type symptoms.  Medication list was reconciled, printed and provided to the patient in AVS. Patient instructions and summary information was reviewed with the patient as documented in the AVS. This note was prepared with assistance of Dragon voice recognition software. Occasional wrong-word or sound-a-like substitutions may have occurred due to the inherent limitations of voice recognition software   "

## 2024-01-04 LAB — COMPREHENSIVE METABOLIC PANEL WITH GFR
ALT: 96 U/L — ABNORMAL HIGH (ref 0–35)
AST: 150 U/L — ABNORMAL HIGH (ref 0–37)
Albumin: 4.1 g/dL (ref 3.5–5.2)
Alkaline Phosphatase: 103 U/L (ref 39–117)
BUN: 24 mg/dL — ABNORMAL HIGH (ref 6–23)
CO2: 28 meq/L (ref 19–32)
Calcium: 8.9 mg/dL (ref 8.4–10.5)
Chloride: 104 meq/L (ref 96–112)
Creatinine, Ser: 1.19 mg/dL (ref 0.40–1.20)
GFR: 41.95 mL/min — ABNORMAL LOW (ref >60.00)
Glucose, Bld: 83 mg/dL (ref 70–99)
Potassium: 5.1 meq/L (ref 3.5–5.1)
Sodium: 140 meq/L (ref 135–145)
Total Bilirubin: 0.6 mg/dL (ref 0.2–1.2)
Total Protein: 6.3 g/dL (ref 6.0–8.3)

## 2024-01-04 LAB — URINALYSIS, ROUTINE W REFLEX MICROSCOPIC
Bilirubin Urine: NEGATIVE
Hgb urine dipstick: NEGATIVE
Nitrite: NEGATIVE
Specific Gravity, Urine: 1.015 (ref 1.000–1.030)
Total Protein, Urine: NEGATIVE
Urine Glucose: NEGATIVE
Urobilinogen, UA: 1 (ref 0.0–1.0)
pH: 6 (ref 5.0–8.0)

## 2024-01-04 LAB — TSH: TSH: 1.46 u[IU]/mL (ref 0.35–5.50)

## 2024-01-05 ENCOUNTER — Other Ambulatory Visit: Payer: Self-pay

## 2024-01-05 ENCOUNTER — Ambulatory Visit: Payer: Self-pay | Admitting: Family Medicine

## 2024-01-05 LAB — URINE CULTURE
MICRO NUMBER:: 17133487
SPECIMEN QUALITY:: ADEQUATE

## 2024-01-05 MED ORDER — SULFAMETHOXAZOLE-TRIMETHOPRIM 800-160 MG PO TABS: 1.0000 | ORAL_TABLET | Freq: Two times a day (BID) | ORAL | 0 refills | Status: AC

## 2024-01-05 NOTE — Progress Notes (Signed)
 Please call patient:urine is infected. Please order septra  DS twice daily for a week.  Also, liver tests are elevated. Please stop the rosuvastatin . We need to recheck again in 6 weeks. Thyroid  level is good.

## 2024-01-06 NOTE — Progress Notes (Signed)
 See mychart note E.coli; septra  DS Dear Ms. Mahaffy, Your urine does show infection and the Septra  should resolve it. Sincerely, Dr. Jodie

## 2024-01-19 ENCOUNTER — Ambulatory Visit

## 2024-01-22 ENCOUNTER — Other Ambulatory Visit: Payer: Self-pay

## 2024-01-22 ENCOUNTER — Telehealth: Payer: Self-pay

## 2024-01-22 NOTE — Telephone Encounter (Signed)
 Spoke with pt to see how she was doing and she stated that she is doing better.

## 2024-01-29 ENCOUNTER — Ambulatory Visit

## 2024-02-21 ENCOUNTER — Ambulatory Visit: Admitting: Family Medicine

## 2024-02-26 ENCOUNTER — Ambulatory Visit

## 2024-02-27 ENCOUNTER — Telehealth: Payer: Self-pay

## 2024-02-27 DIAGNOSIS — E782 Mixed hyperlipidemia: Secondary | ICD-10-CM

## 2024-02-27 NOTE — Progress Notes (Signed)
 Pharmacy Quality Measure Review  This patient is appearing on a report for being at risk of failing the adherence measure for cholesterol (statin) medications this calendar year.   Medication: Rosuvastatin  5mg  Last fill date: 08/24 for 100 day supply  Patient states she is no longer taking as instructed by PCP. In October, LFTs were elevated and PCP instructed patient on 01/05/24 to stop rosuvastatin . Next appointment is scheduled for 03/21/24. Encouraged patient to keep this appointment and follow instructions for rosuvastatin  given at that time. All questions by the patient were answered.  Lynleigh Kovack, PharmD [facility] Pharmacist

## 2024-03-20 ENCOUNTER — Ambulatory Visit: Admitting: Family Medicine

## 2024-03-21 ENCOUNTER — Ambulatory Visit: Admitting: Family Medicine

## 2024-03-25 ENCOUNTER — Other Ambulatory Visit: Payer: Self-pay | Admitting: Family Medicine

## 2024-03-27 ENCOUNTER — Ambulatory Visit: Admitting: Family Medicine

## 2024-04-10 ENCOUNTER — Ambulatory Visit

## 2024-04-10 ENCOUNTER — Encounter: Admitting: Family Medicine

## 2024-04-26 ENCOUNTER — Encounter: Admitting: Family Medicine

## 2024-05-02 ENCOUNTER — Ambulatory Visit

## 2024-05-22 ENCOUNTER — Ambulatory Visit

## 2024-07-03 ENCOUNTER — Ambulatory Visit: Admitting: Family Medicine
